# Patient Record
Sex: Male | Born: 1950
Health system: Southern US, Community
[De-identification: ages and names within clinical notes are randomized; demographics above are authoritative.]

## PROBLEM LIST (undated history)

## (undated) DIAGNOSIS — G629 Polyneuropathy, unspecified: Secondary | ICD-10-CM

## (undated) DIAGNOSIS — M84373A Stress fracture, unspecified ankle, initial encounter for fracture: Secondary | ICD-10-CM

## (undated) DIAGNOSIS — K859 Acute pancreatitis without necrosis or infection, unspecified: Secondary | ICD-10-CM

## (undated) DIAGNOSIS — I739 Peripheral vascular disease, unspecified: Secondary | ICD-10-CM

## (undated) DIAGNOSIS — M1990 Unspecified osteoarthritis, unspecified site: Secondary | ICD-10-CM

## (undated) DIAGNOSIS — I10 Essential (primary) hypertension: Secondary | ICD-10-CM

## (undated) DIAGNOSIS — F1010 Alcohol abuse, uncomplicated: Secondary | ICD-10-CM

## (undated) DIAGNOSIS — K219 Gastro-esophageal reflux disease without esophagitis: Secondary | ICD-10-CM

## (undated) DIAGNOSIS — E119 Type 2 diabetes mellitus without complications: Secondary | ICD-10-CM

## (undated) HISTORY — DX: Acute pancreatitis without necrosis or infection, unspecified: K85.90

## (undated) HISTORY — DX: Stress fracture, unspecified ankle, initial encounter for fracture: M84.373A

## (undated) HISTORY — DX: Essential (primary) hypertension: I10

## (undated) HISTORY — PX: PERIPHERAL VASCULAR CATHETERIZATION: SHX172C

## (undated) HISTORY — DX: Alcohol abuse, uncomplicated: F10.10

## (undated) HISTORY — DX: Type 2 diabetes mellitus without complications: E11.9

---

## 2006-02-13 ENCOUNTER — Inpatient Hospital Stay: Payer: Self-pay | Admitting: Internal Medicine

## 2006-02-17 ENCOUNTER — Other Ambulatory Visit: Payer: Self-pay

## 2007-02-27 ENCOUNTER — Other Ambulatory Visit: Payer: Self-pay

## 2007-02-27 ENCOUNTER — Inpatient Hospital Stay: Payer: Self-pay | Admitting: Internal Medicine

## 2007-10-07 ENCOUNTER — Encounter: Admission: RE | Admit: 2007-10-07 | Discharge: 2007-10-07 | Payer: Self-pay | Admitting: Family Medicine

## 2008-09-19 IMAGING — CT CT ABDOMEN W/ CM
2 of 5 series · 17 of 46 positions shown, 19 images · IV contrast (READICAT/WATER & [ID] OMNI 300)
Comparison: None--- after completion of the study, the patient did
develop mild urticaria.

CLINICAL DATA: Abdominal pain, elevated lipase and lakes

CT ABDOMEN WITH CONTRAST
TECHNIQUE: Multidetector CT imaging of the abdomen was performed
following the standard protocol during bolus administration of
intravenous contrast.
Contrast: 100 ml Hmnipaque-BMM

[Series 3: routine abdomen · axial · 0.81mm/px · z∈[-296,-6]mm · 14 of 66 slices shown, 16 images]
[im 4/66  soft-tissue]
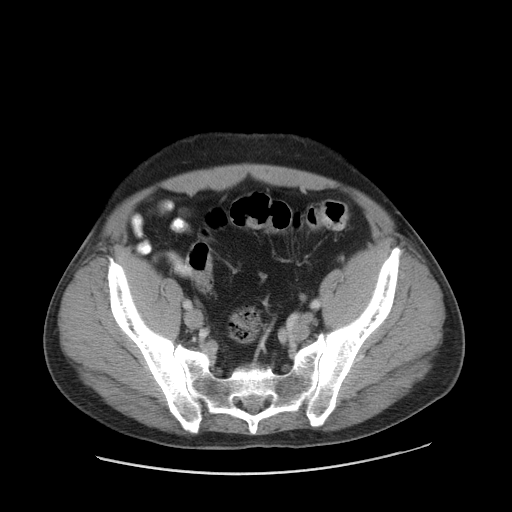
[im 4/66  bone]
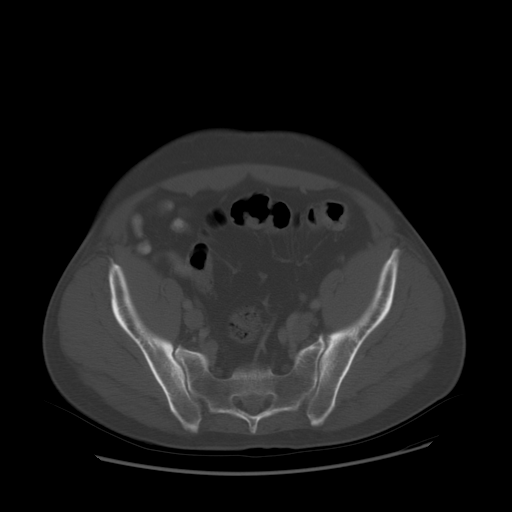
[im 8/66  soft-tissue]
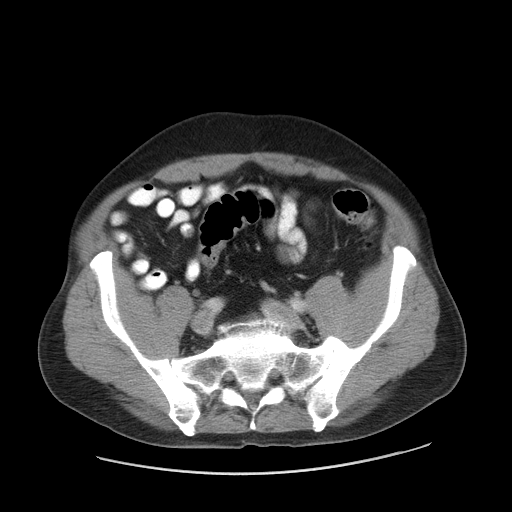
[im 15/66  soft-tissue]
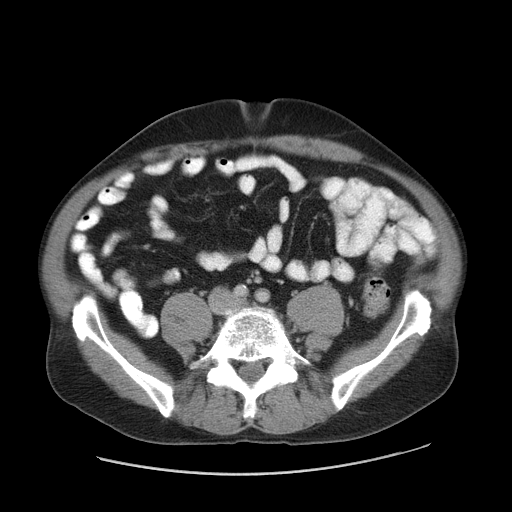
[im 19/66  soft-tissue]
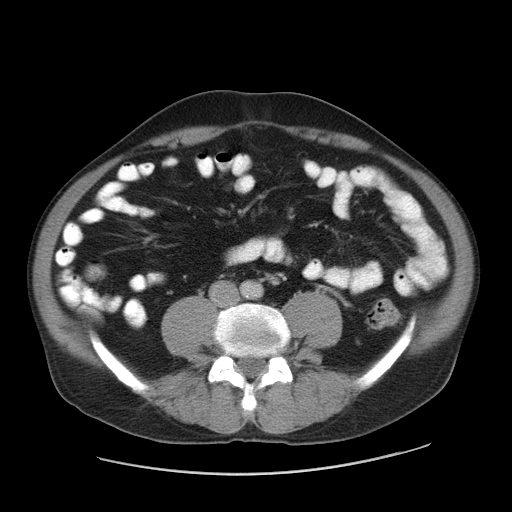
[im 22/66  soft-tissue]
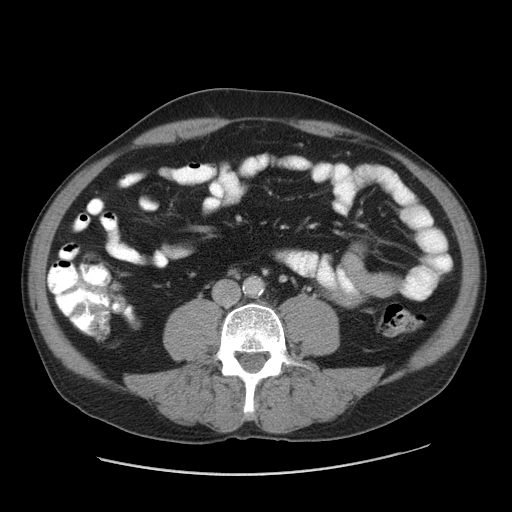
[im 26/66  soft-tissue]
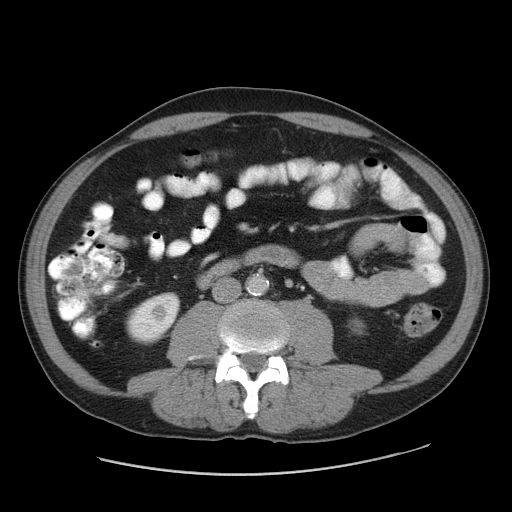
[im 29/66  soft-tissue]
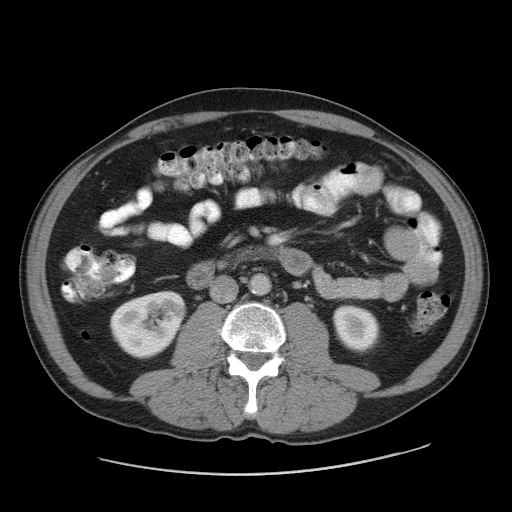
[im 37/66  soft-tissue]
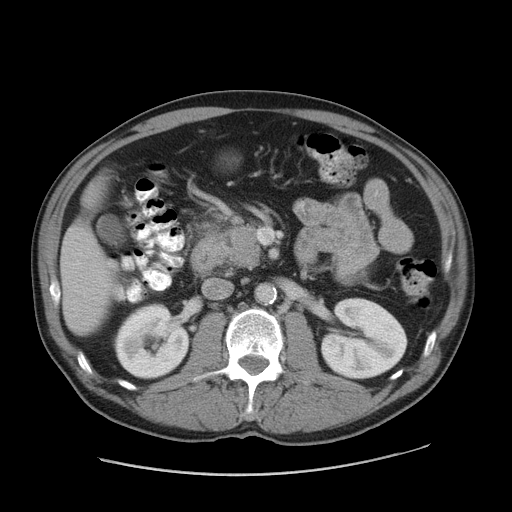
[im 40/66  soft-tissue]
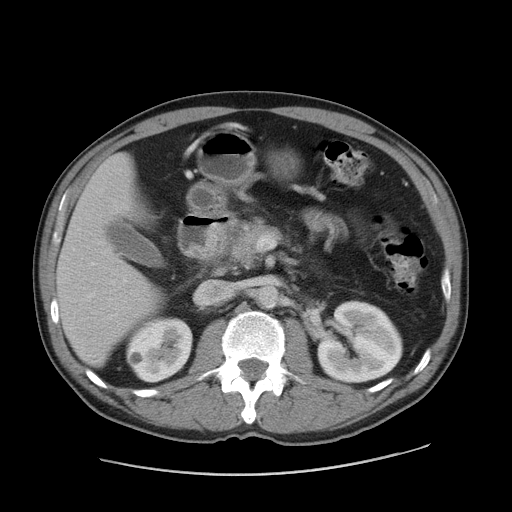
[im 40/66  bone]
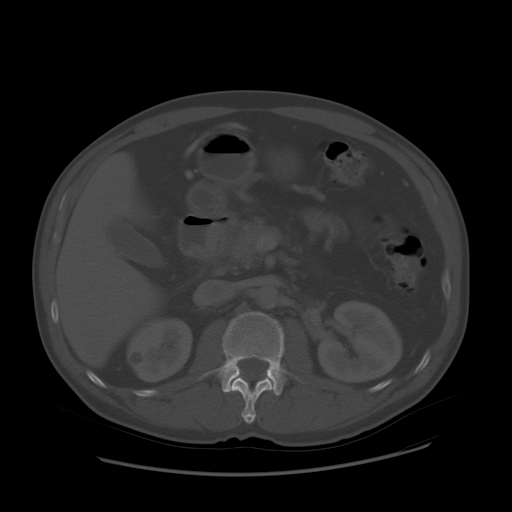
[im 44/66  soft-tissue]
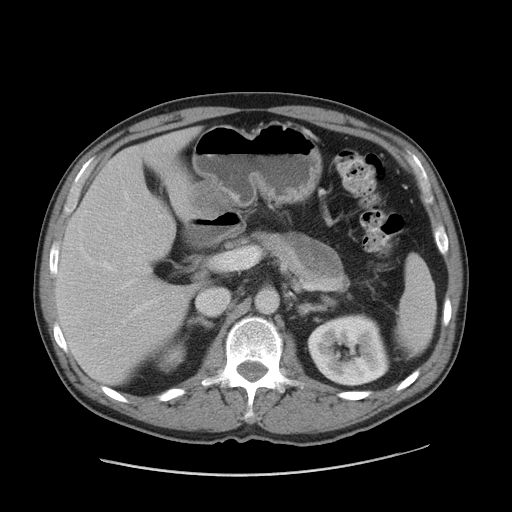
[im 47/66  soft-tissue]
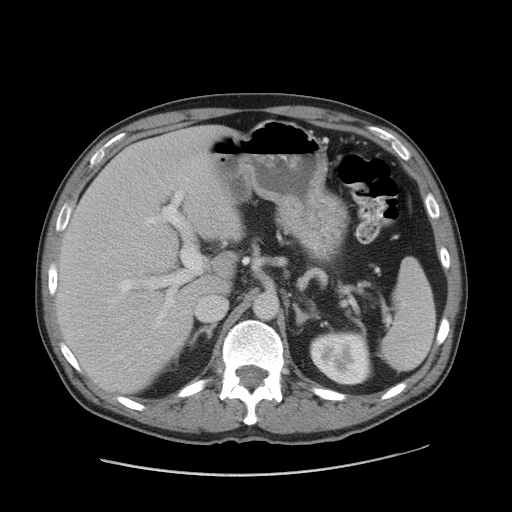
[im 51/66  soft-tissue]
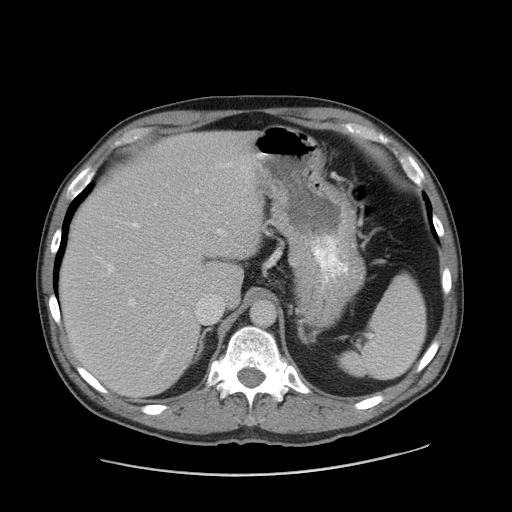
[im 58/66  soft-tissue]
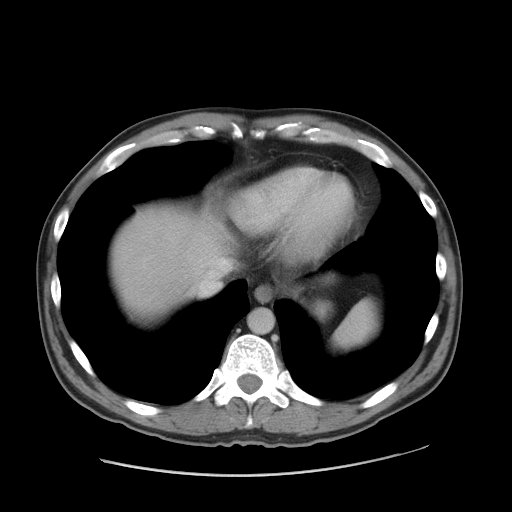
[im 62/66  soft-tissue]
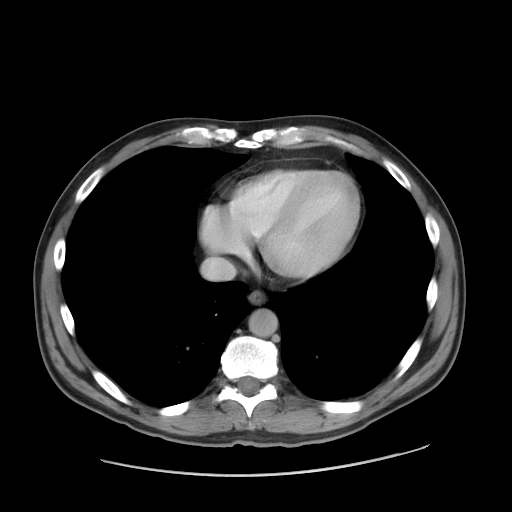

[Series 602: sagittal body · sagittal · 0.81mm/px · 3 of 154 slices shown]
[im 52/154  soft-tissue]
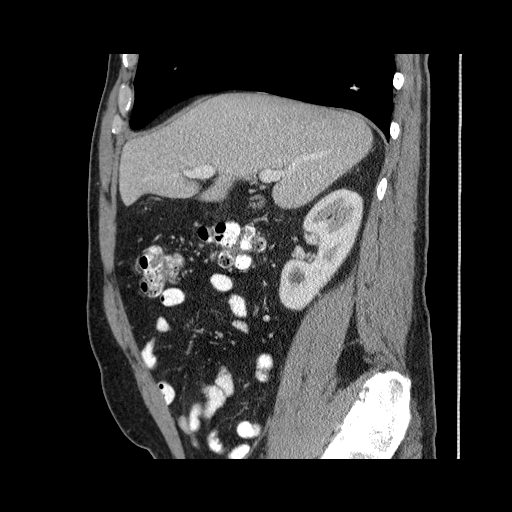
[im 69/154  soft-tissue]
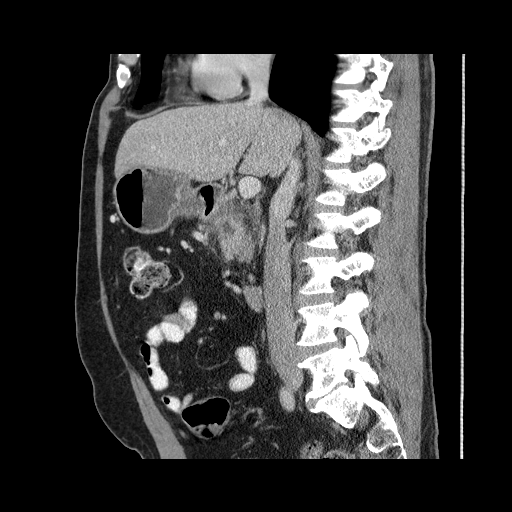
[im 86/154  soft-tissue]
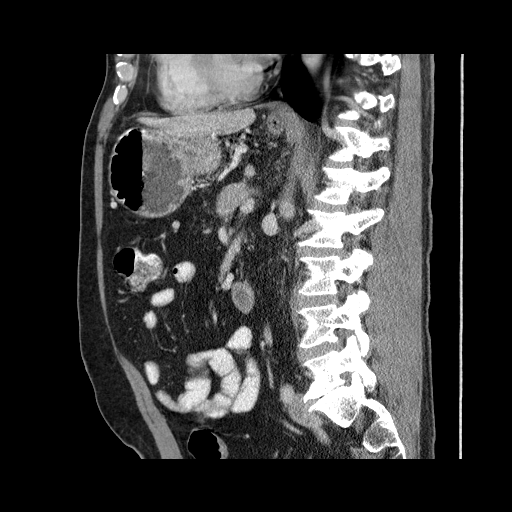

[17 of 46 positions shown; findings below may reference images not displayed]

He was given fluids and observed, and in
the future premedication with Benadryl may be helpful prior to I
being given IV contrast media.
FINDINGS: The lung bases are clear.  The liver enhances with no
focal abnormality and no ductal dilatation is seen.  No calcified
gallstones are noted.  There is poorly defined low attenuation in
the head - neck of the pancreas with peripancreatic strandiness.
These findings are consistent with focal pancreatitis.  In addition
there is an oval low attenuation located along the anterior body of
the mid pancreas to the tail of pancreas measuring 60 x 22 mm with
a height of 29 mm,.  This probably represents a developing
pseudocyst, .  Pancreatic duct is not dilated.  Follow-up CT or MRI
is recommended to exclude underlying malignancy.  The adrenal
glands and spleen appear normal.  The kidneys enhance and there is
a small cyst in the lower pole of the right kidney.  The
pelvocaliceal systems appear normal and the aorta is normal in
caliber.  The appendix is well seen within the right lower quadrant
and appears normal.  There are degenerative changes noted
particulate L5-S1.
IMPRESSION: 1.  Probable focal pancreatitis of the head of the pancreas with
developing pseudocyst along the body and tail of pancreas
anteriorly as described above.  Recommend follow-up CT of the
abdomen or MRI to exclude underlying malignancy.  Pancreatic duct
is not dilated.
2.  Appendix appears normal.
3.  Degenerative disc disease at L5-S1.

## 2010-07-16 ENCOUNTER — Encounter: Payer: Self-pay | Admitting: Family Medicine

## 2012-10-15 ENCOUNTER — Encounter: Payer: Self-pay | Admitting: Family Medicine

## 2012-10-15 ENCOUNTER — Other Ambulatory Visit: Payer: Self-pay | Admitting: Family Medicine

## 2012-10-15 ENCOUNTER — Ambulatory Visit (INDEPENDENT_AMBULATORY_CARE_PROVIDER_SITE_OTHER): Payer: BC Managed Care – PPO | Admitting: Family Medicine

## 2012-10-15 VITALS — BP 160/90 | HR 88 | Temp 97.9°F | Resp 16 | Wt 203.0 lb

## 2012-10-15 DIAGNOSIS — I10 Essential (primary) hypertension: Secondary | ICD-10-CM

## 2012-10-15 DIAGNOSIS — F17200 Nicotine dependence, unspecified, uncomplicated: Secondary | ICD-10-CM

## 2012-10-15 DIAGNOSIS — I739 Peripheral vascular disease, unspecified: Secondary | ICD-10-CM

## 2012-10-15 DIAGNOSIS — G609 Hereditary and idiopathic neuropathy, unspecified: Secondary | ICD-10-CM

## 2012-10-15 DIAGNOSIS — K859 Acute pancreatitis without necrosis or infection, unspecified: Secondary | ICD-10-CM | POA: Insufficient documentation

## 2012-10-15 DIAGNOSIS — F1010 Alcohol abuse, uncomplicated: Secondary | ICD-10-CM | POA: Insufficient documentation

## 2012-10-15 LAB — CBC WITH DIFFERENTIAL/PLATELET
Basophils Absolute: 0 10*3/uL (ref 0.0–0.1)
Basophils Relative: 0 % (ref 0–1)
Eosinophils Absolute: 0.1 10*3/uL (ref 0.0–0.7)
Eosinophils Relative: 1 % (ref 0–5)
HCT: 45.5 % (ref 39.0–52.0)
Hemoglobin: 15.7 g/dL (ref 13.0–17.0)
Lymphocytes Relative: 27 % (ref 12–46)
Lymphs Abs: 2.9 10*3/uL (ref 0.7–4.0)
MCH: 31.3 pg (ref 26.0–34.0)
MCHC: 34.5 g/dL (ref 30.0–36.0)
MCV: 90.6 fL (ref 78.0–100.0)
Monocytes Absolute: 0.6 10*3/uL (ref 0.1–1.0)
Monocytes Relative: 6 % (ref 3–12)
Neutro Abs: 7.2 10*3/uL (ref 1.7–7.7)
Neutrophils Relative %: 66 % (ref 43–77)
Platelets: 250 10*3/uL (ref 150–400)
RBC: 5.02 MIL/uL (ref 4.22–5.81)
RDW: 14.4 % (ref 11.5–15.5)
WBC: 10.9 10*3/uL — ABNORMAL HIGH (ref 4.0–10.5)

## 2012-10-15 LAB — BASIC METABOLIC PANEL
BUN: 13 mg/dL (ref 6–23)
CO2: 28 mEq/L (ref 19–32)
Calcium: 9.8 mg/dL (ref 8.4–10.5)
Chloride: 104 mEq/L (ref 96–112)
Creat: 0.77 mg/dL (ref 0.50–1.35)
Glucose, Bld: 139 mg/dL — ABNORMAL HIGH (ref 70–99)
Potassium: 4.8 mEq/L (ref 3.5–5.3)
Sodium: 138 mEq/L (ref 135–145)

## 2012-10-15 MED ORDER — AMLODIPINE BESYLATE 10 MG PO TABS: 10.0000 mg | ORAL_TABLET | Freq: Every day | ORAL | Status: DC

## 2012-10-15 NOTE — Progress Notes (Signed)
Subjective:    Patient ID: Oscar Mills, male    DOB: 1951/03/10, 62 y.o.   MRN: [id number]  HPI  The patient has not been seen for years. He continues to smoke every day. He has a history of pancreatitis in the past due to alcohol use. He is now absent from alcohol.  Unfortunately he is not up-to-date with any of his health maintenance screening. He comes in today complaining of several months of pain in both calves. This is made worse by wrapping, walking up an incline, or climbing a ladder. He also complains of burning dysesthesias and paresthesias in both feet. He denies any low back pain. He denies any numbness or weakness in the legs.  He denies any chest pain, shortness of breath, or dyspnea on exertion. Past Medical History  Diagnosis Date  . Pancreatitis   . Dysfunctional alcohol use     now quit   Current outpatient prescriptions:acetaminophen (TYLENOL) 325 MG tablet, Take 650 mg by mouth every 6 (six) hours as needed for pain., Disp: , Rfl: ;  Omeprazole-Sodium Bicarbonate (ZEGERID OTC PO), Take by mouth., Disp: , Rfl: ;  amLODipine (NORVASC) 10 MG tablet, Take 1 tablet (10 mg total) by mouth daily., Disp: 90 tablet, Rfl: 3  Allergies  Allergen Reactions  . Iohexol      Code: HIVES, Desc: HIVES WITH OMNIPAQUE 300 OBSERVED BY DR Gery Pray NO MEDS GIVEN    History   Social History  . Marital Status: Married    Spouse Name: N/A    Number of Children: N/A  . Years of Education: N/A   Occupational History  . Not on file.   Social History Main Topics  . Smoking status: Current Every Day Smoker -- 0.25 packs/day    Types: Cigarettes  . Smokeless tobacco: Not on file  . Alcohol Use: Not on file  . Drug Use: Not on file  . Sexually Active: Not on file   Other Topics Concern  . Not on file   Social History Narrative  . No narrative on file      Review of Systems  All other systems reviewed and are negative.       Objective:   Physical Exam  Constitutional:  He is oriented to person, place, and time. He appears well-developed and well-nourished.  HENT:  Head: Normocephalic.  Mouth/Throat: Oropharynx is clear and moist.  Eyes: Conjunctivae are normal. Pupils are equal, round, and reactive to light.  Neck: Neck supple. No JVD present. No thyromegaly present.  Cardiovascular: Normal rate, regular rhythm and normal heart sounds.  Exam reveals no gallop and no friction rub.   No murmur heard. Pulses:      Popliteal pulses are 0 on the right side, and 0 on the left side.       Dorsalis pedis pulses are 0 on the right side, and 1+ on the left side.       Posterior tibial pulses are 1+ on the right side, and 1+ on the left side.  Pulmonary/Chest: Effort normal and breath sounds normal. No respiratory distress. He has no wheezes. He has no rales. He exhibits no tenderness.  Abdominal: Soft. Bowel sounds are normal. He exhibits no distension and no mass. There is no tenderness. There is no rebound and no guarding.  Lymphadenopathy:    He has no cervical adenopathy.  Neurological: He is alert and oriented to person, place, and time. He has normal reflexes. He displays normal reflexes. No cranial nerve  deficit. He exhibits normal muscle tone. Coordination normal.   he has normal sensation to 10 mg monofilament bilaterally in both feet, normal sensation to cold, and normal sensation to vibration.        Assessment & Plan:  1. PVD (peripheral vascular disease) His symptoms are most likely claudication. We'll obtain vascular studies to evaluate. Strongly recommend smoking cessation. The patient has peripheral vascular disease he would benefit from being on an antiplatelet agent such as Plavix. - [place] Lower Ext Art Bilat; Future  2. Unspecified hereditary and idiopathic peripheral neuropathy He also has some symptoms of neuropathy. We'll evaluate for metabolic causes of neuropathy including hypothyroidism, B12 deficiency, and diabetes. He does have neuropathy  due to his history of alcohol abuse he - Basic Metabolic Panel - CBC with Differential - TSH - Vitamin B12  3. HTN (hypertension) Blood pressure is currently poorly controlled we will start Norvasc 10 mg by mouth daily. See him back in one month to reevaluate - amLODipine (NORVASC) 10 MG tablet; Take 1 tablet (10 mg total) by mouth daily.  Dispense: 90 tablet; Refill: 3  4. Smoker Advised smoking cessation.

## 2012-10-16 LAB — HEMOGLOBIN A1C
Hgb A1c MFr Bld: 5.7 % — ABNORMAL HIGH (ref <5.7)
Mean Plasma Glucose: 117 mg/dL — ABNORMAL HIGH (ref <117)

## 2012-10-16 LAB — VITAMIN B12: Vitamin B-12: 528 pg/mL (ref 211–911)

## 2012-10-16 LAB — TSH: TSH: 1.143 u[IU]/mL (ref 0.350–4.500)

## 2012-10-22 ENCOUNTER — Other Ambulatory Visit: Payer: Self-pay | Admitting: Family Medicine

## 2012-10-22 DIAGNOSIS — I739 Peripheral vascular disease, unspecified: Secondary | ICD-10-CM

## 2012-10-28 ENCOUNTER — Other Ambulatory Visit: Payer: Self-pay | Admitting: Family Medicine

## 2012-10-28 DIAGNOSIS — I739 Peripheral vascular disease, unspecified: Secondary | ICD-10-CM

## 2012-10-29 ENCOUNTER — Inpatient Hospital Stay: Admission: RE | Admit: 2012-10-29 | Payer: Self-pay | Source: Ambulatory Visit

## 2012-11-04 ENCOUNTER — Other Ambulatory Visit: Payer: Self-pay

## 2013-10-27 ENCOUNTER — Other Ambulatory Visit: Payer: Self-pay | Admitting: Family Medicine

## 2013-10-27 NOTE — Telephone Encounter (Signed)
Refill appropriate and filled per protocol. 

## 2013-11-20 ENCOUNTER — Ambulatory Visit (INDEPENDENT_AMBULATORY_CARE_PROVIDER_SITE_OTHER): Payer: BC Managed Care – PPO | Admitting: Family Medicine

## 2013-11-20 ENCOUNTER — Encounter: Payer: Self-pay | Admitting: Family Medicine

## 2013-11-20 VITALS — BP 130/70 | HR 74 | Temp 97.6°F | Resp 18 | Ht 74.0 in | Wt 204.0 lb

## 2013-11-20 DIAGNOSIS — I739 Peripheral vascular disease, unspecified: Secondary | ICD-10-CM

## 2013-11-20 NOTE — Progress Notes (Signed)
Subjective:    Patient ID: Oscar Mills, male    DOB: 21-May-1951, 63 y.o.   MRN: [id number]  HPI  10/15/12 The patient has not been seen for years. He continues to smoke every day. He has a history of pancreatitis in the past due to alcohol use. He is now absent from alcohol.  Unfortunately he is not up-to-date with any of his health maintenance screening. He comes in today complaining of several months of pain in both calves. This is made worse by wrapping, walking up an incline, or climbing a ladder. He also complains of burning dysesthesias and paresthesias in both feet. He denies any low back pain. He denies any numbness or weakness in the legs.  He denies any chest pain, shortness of breath, or dyspnea on exertion.  At that time, my plan was: 1. PVD (peripheral vascular disease) His symptoms are most likely claudication. We'll obtain vascular studies to evaluate. Strongly recommend smoking cessation. The patient has peripheral vascular disease he would benefit from being on an antiplatelet agent such as Plavix. - [place] Lower Ext Art Bilat; Future  2. Unspecified hereditary and idiopathic peripheral neuropathy He also has some symptoms of neuropathy. We'll evaluate for metabolic causes of neuropathy including hypothyroidism, B12 deficiency, and diabetes. He does have neuropathy due to his history of alcohol abuse he - Basic Metabolic Panel - CBC with Differential - TSH - Vitamin B12  3. HTN (hypertension) Blood pressure is currently poorly controlled we will start Norvasc 10 mg by mouth daily. See him back in one month to reevaluate - amLODipine (NORVASC) 10 MG tablet; Take 1 tablet (10 mg total) by mouth daily.  Dispense: 90 tablet; Refill: 3  4. Smoker Advised smoking cessation.  11/20/13 Patient did not go to get the arterial Dopplers. He presents today complaining of bilateral leg pain. He complains of tightness in both calves when he is walking or climbing a ladder or going up an  incline plane.  It improves with rest. He also complains of burning pain in both legs. The right is much worse than the left. He denies any low back pain. He is seen a chiropractor who thought his symptoms were right-sided sciatica. However this would not explain the bilateral nature of the pain. On examination today I cannot find any dorsalis pedis pulse posterior tibialis pulse and popliteal pulse on the right leg. He has strong femoral pulses in both legs Past Medical History  Diagnosis Date  . Pancreatitis   . Dysfunctional alcohol use     now quit   Current outpatient prescriptions:amLODipine (NORVASC) 10 MG tablet, Take 10 mg by mouth daily., Disp: , Rfl: ;  Omeprazole-Sodium Bicarbonate (ZEGERID OTC) 20-1100 MG CAPS capsule, Take 1 capsule by mouth daily before breakfast., Disp: , Rfl:   Allergies  Allergen Reactions  . Iohexol      Code: HIVES, Desc: HIVES WITH OMNIPAQUE 300 OBSERVED BY DR Alvester Chou NO MEDS GIVEN    History   Social History  . Marital Status: Married    Spouse Name: N/A    Number of Children: N/A  . Years of Education: N/A   Occupational History  . Not on file.   Social History Main Topics  . Smoking status: Current Every Day Smoker -- 0.25 packs/day    Types: Cigarettes  . Smokeless tobacco: Not on file  . Alcohol Use: Not on file  . Drug Use: Not on file  . Sexual Activity: Not on file   Other Topics  Concern  . Not on file   Social History Narrative  . No narrative on file      Review of Systems  All other systems reviewed and are negative.      Objective:   Physical Exam  Constitutional: He is oriented to person, place, and time. He appears well-developed and well-nourished.  HENT:  Head: Normocephalic.  Mouth/Throat: Oropharynx is clear and moist.  Eyes: Conjunctivae are normal. Pupils are equal, round, and reactive to light.  Neck: Neck supple. No JVD present. No thyromegaly present.  Cardiovascular: Normal rate, regular rhythm and  normal heart sounds.  Exam reveals no gallop and no friction rub.   No murmur heard. Pulses:      Popliteal pulses are 0 on the right side, and 0 on the left side.       Dorsalis pedis pulses are 0 on the right side, and 1+ on the left side.       Posterior tibial pulses are 1+ on the right side, and 1+ on the left side.  Pulmonary/Chest: Effort normal and breath sounds normal. No respiratory distress. He has no wheezes. He has no rales. He exhibits no tenderness.  Abdominal: Soft. Bowel sounds are normal. He exhibits no distension and no mass. There is no tenderness. There is no rebound and no guarding.  Lymphadenopathy:    He has no cervical adenopathy.  Neurological: He is alert and oriented to person, place, and time. He has normal reflexes. No cranial nerve deficit. He exhibits normal muscle tone. Coordination normal.   he has normal sensation to 10 mg monofilament bilaterally in both feet, normal sensation to cold, and normal sensation to vibration.        Assessment & Plan:   1. Claudication of calf muscles Differential diagnosis includes peripheral vascular disease versus neurogenic claudication. Given the absence of low back pain and bilateral nature of the pain, I believe the symptoms are most likely due to peripheral vascular disease. I will obtain lower extremity arterial Dopplers. I recommended aspirin 81 mg by mouth daily. Recommended smoking cessation. I also asked the patient return fasting for a CMP and fasting lipid panel. If the patient's arterial Doppler studies are normal, I would next proceed with an MRI of the lumbar spine to evaluate for spinal stenosis.

## 2013-11-30 ENCOUNTER — Encounter: Payer: Self-pay | Admitting: Family Medicine

## 2013-11-30 ENCOUNTER — Ambulatory Visit (INDEPENDENT_AMBULATORY_CARE_PROVIDER_SITE_OTHER): Payer: BC Managed Care – PPO | Admitting: Family Medicine

## 2013-11-30 ENCOUNTER — Other Ambulatory Visit: Payer: BC Managed Care – PPO

## 2013-11-30 ENCOUNTER — Other Ambulatory Visit (HOSPITAL_COMMUNITY): Payer: Self-pay | Admitting: Cardiology

## 2013-11-30 ENCOUNTER — Ambulatory Visit (HOSPITAL_COMMUNITY): Payer: BC Managed Care – PPO | Attending: Cardiology | Admitting: Cardiology

## 2013-11-30 VITALS — BP 140/78 | HR 76 | Temp 97.7°F | Resp 20 | Ht 74.0 in | Wt 202.0 lb

## 2013-11-30 DIAGNOSIS — I739 Peripheral vascular disease, unspecified: Secondary | ICD-10-CM

## 2013-11-30 DIAGNOSIS — I70219 Atherosclerosis of native arteries of extremities with intermittent claudication, unspecified extremity: Secondary | ICD-10-CM | POA: Insufficient documentation

## 2013-11-30 DIAGNOSIS — J209 Acute bronchitis, unspecified: Secondary | ICD-10-CM

## 2013-11-30 MED ORDER — AZITHROMYCIN 250 MG PO TABS: ORAL_TABLET | ORAL | Status: DC

## 2013-11-30 NOTE — Progress Notes (Signed)
Lower arterial doppler and duplex bilateral performed

## 2013-11-30 NOTE — Progress Notes (Signed)
   Subjective:    Patient ID: Oscar Mills, male    DOB: 08-21-1950, 63 y.o.   MRN: [id number]  HPI The patient had an upper respiratory infection for over a week. Is characterized by rhinorrhea, sinus pressure, sinus drainage. However over the last week he's developed a cough productive of green sputum increasing shortness of breath and chest congestion. He also reports subjective fevers. He is a smoker. He denies any chest pain. He denies any pleurisy. He denies any hemoptysis. Past Medical History  Diagnosis Date  . Pancreatitis   . Dysfunctional alcohol use     now quit   Current Outpatient Prescriptions on File Prior to Visit  Medication Sig Dispense Refill  . amLODipine (NORVASC) 10 MG tablet Take 10 mg by mouth daily.      Earney Navy Bicarbonate (ZEGERID OTC) 20-1100 MG CAPS capsule Take 1 capsule by mouth daily before breakfast.       No current facility-administered medications on file prior to visit.   Allergies  Allergen Reactions  . Iohexol      Code: HIVES, Desc: HIVES WITH OMNIPAQUE 300 OBSERVED BY DR Alvester Chou NO MEDS GIVEN    History   Social History  . Marital Status: Married    Spouse Name: N/A    Number of Children: N/A  . Years of Education: N/A   Occupational History  . Not on file.   Social History Main Topics  . Smoking status: Current Every Day Smoker -- 0.25 packs/day    Types: Cigarettes  . Smokeless tobacco: Not on file  . Alcohol Use: Not on file  . Drug Use: Not on file  . Sexual Activity: Not on file   Other Topics Concern  . Not on file   Social History Narrative  . No narrative on file      Review of Systems  All other systems reviewed and are negative.      Objective:   Physical Exam  Vitals reviewed. Constitutional: He appears well-developed and well-nourished. No distress.  HENT:  Right Ear: External ear normal.  Left Ear: External ear normal.  Nose: Nose normal.  Mouth/Throat: Oropharynx is clear and  moist. No oropharyngeal exudate.  Eyes: Conjunctivae are normal.  Neck: Neck supple.  Cardiovascular: Normal rate, regular rhythm and normal heart sounds.   Pulmonary/Chest: Effort normal and breath sounds normal. No respiratory distress. He has no wheezes. He has no rales.  Lymphadenopathy:    He has no cervical adenopathy.  Skin: He is not diaphoretic.          Assessment & Plan:  1. Acute bronchitis I recommended the patient begin Mucinex 400 mg every 4 hours when necessary coughing. Also recommended he begin Zithromax 500 mg by mouth daily one, 250 mg by mouth daily on days 2-5. Recheck in 1 week if no better sooner if worse. - azithromycin (ZITHROMAX) 250 MG tablet; 2 tabs poqday1, 1 tab poqday 2-5  Dispense: 6 tablet; Refill: 0

## 2013-12-02 ENCOUNTER — Other Ambulatory Visit: Payer: Self-pay | Admitting: Family Medicine

## 2013-12-02 DIAGNOSIS — I70219 Atherosclerosis of native arteries of extremities with intermittent claudication, unspecified extremity: Secondary | ICD-10-CM

## 2014-01-21 ENCOUNTER — Encounter: Payer: Self-pay | Admitting: Internal Medicine

## 2014-01-21 ENCOUNTER — Ambulatory Visit (INDEPENDENT_AMBULATORY_CARE_PROVIDER_SITE_OTHER): Payer: BC Managed Care – PPO | Admitting: Internal Medicine

## 2014-01-21 ENCOUNTER — Telehealth: Payer: Self-pay | Admitting: *Deleted

## 2014-01-21 VITALS — BP 144/76 | HR 66 | Ht 74.0 in | Wt 202.7 lb

## 2014-01-21 DIAGNOSIS — F17200 Nicotine dependence, unspecified, uncomplicated: Secondary | ICD-10-CM

## 2014-01-21 DIAGNOSIS — I739 Peripheral vascular disease, unspecified: Secondary | ICD-10-CM | POA: Insufficient documentation

## 2014-01-21 NOTE — Telephone Encounter (Signed)
Patient is scheduled 01/25/14 at 11:45

## 2014-01-21 NOTE — Progress Notes (Signed)
OFFICE NOTE  Chief Complaint:  Right leg pain  Primary Care Physician: Odette Fraction, MD  HPI:  Oscar Mills is a pleasant 63 year old male kindly referred to me by Dr. Dennard Schaumann. He is a smoker with hypertension and a family history of heart disease. Over the past 2 years he's had pain in his right leg which has become progressively worse. When he walks he gets pain in his calf that does improve after resting. He starts walking again his pain comes back. Quickly. He denies any pain at rest. He does note that his feet are slightly cool particularly on the right. He denies any chest pain or shortness of breath with exertion. He recently had Dopplers of the right leg which indicated an ABI of 0.4 for the right and 1.2 on the left. The right TBI was 0.35 and 1.0 the left. Is referred for further workup of peripheral arterial disease.  PMHx:  Past Medical History  Diagnosis Date  . Pancreatitis   . Dysfunctional alcohol use     now quit  . Hypertension     History reviewed. No pertinent past surgical history.  FAMHx:  Family History  Problem Relation Age of Onset  . Heart attack Father     SOCHx:   reports that he has been smoking Cigarettes.  He has a 7.5 pack-year smoking history. He has never used smokeless tobacco. He reports that he does not drink alcohol or use illicit drugs.  ALLERGIES:  Allergies  Allergen Reactions  . Iohexol      Code: HIVES, Desc: HIVES WITH OMNIPAQUE 300 OBSERVED BY DR Alvester Chou NO MEDS GIVEN     ROS: A comprehensive review of systems was negative except for: Musculoskeletal: positive for right leg claudication  HOME MEDS: Current Outpatient Prescriptions  Medication Sig Dispense Refill  . amLODipine (NORVASC) 10 MG tablet Take 10 mg by mouth daily.      Marland Kitchen aspirin 81 MG tablet Take 81 mg by mouth daily.      Earney Navy Bicarbonate (ZEGERID OTC) 20-1100 MG CAPS capsule Take 1 capsule by mouth daily before breakfast.       No  current facility-administered medications for this visit.    LABS/IMAGING: No results found for this or any previous visit (from the past 48 hour(s)). No results found.  VITALS: BP 144/76  Pulse 66  Ht 6\' 2"  (1.88 m)  Wt 202 lb 11.2 oz (91.944 kg)  BMI 26.01 kg/m2  EXAM: General appearance: alert and no distress Neck: no carotid bruit and no JVD Lungs: clear to auscultation bilaterally Heart: regular rate and rhythm, S1, S2 normal, no murmur, click, rub or gallop Abdomen: soft, non-tender; bowel sounds normal; no masses,  no organomegaly Extremities: extremities normal, atraumatic, no cyanosis or edema Pulses: right DP/PT are diminished, right popliteal is 1+, 2+ pulses on the left Skin: Skin color, texture, turgor normal. No rashes or lesions Neurologic: Grossly normal Psych: Normal  EKG: Normal sinus rhythm at 66, incomplete right bundle branch pattern  ASSESSMENT: 1. Symptomatic right lower extremity claudication with ABI 0.44 and TBI 0.35 2. Ongoing tobacco abuse 3. Hypertension 4. Family history heart disease  PLAN: 1.   Mr. Self has symptomatic right lower extremity claudication with severely reduced ABI. He is on aspirin and I doubt we'll have much benefit from additional symptomatic agents. I would recommend angiography as a definitive means to identify his stenosis which appears to be at the SFA level. He is interested in having  the procedure done in [place] and I discussed the case with Dr. Fletcher Anon, who is agreeable to perform the angiogram in the next couple of weeks. Who had tried arrange this through scheduling nurse as well as get preprocedure labs and a fasting lipid profile. I suspect he will need cholesterol medication as well. He may need a cardiovascular workup based on his risk factors as well although he's not describing any chest pain or shortness of breath.  Thanks for the kind referral.  Pixie Casino, MD, [facility] Attending Cardiologist [facility]  Sherita Decoste C 01/21/2014, 9:29 AM

## 2014-01-21 NOTE — Telephone Encounter (Signed)
Message copied by Tracie Harrier on Thu Jan 21, 2014  5:22 PM ------      Message from: [name] A      Created: Thu Jan 21, 2014  5:09 PM       Will do.             Elmyra Ricks, can you schedule this patient to see me ASAP.             ----- Message -----         From: Pixie Casino, MD         Sent: 01/21/2014   4:52 PM           To: Wellington Hampshire, MD            Maybe my misunderstanding .. I thought you did PV cases in [place].  Maybe you can see him in the office first in [place] and then offer to do the case in [place].  He has had this for 2 years, but now that he knows what it is, he wants to get better quickly .Marland Kitchen So he may be agreeable to getting it done wherever he has to.            Thanks.            -Mali            ----- Message -----         From: Wellington Hampshire, MD         Sent: 01/21/2014   4:41 PM           To: Pixie Casino, MD            Mali,      Sorry I misunderstood. I thought he wanted his future clinic follow up to be in [place] not the procedure itself.       I have done PV procedures at [facility] in the past but not recently due to due to lack of scheduling flexibility and different equipment.       If you want, I can have my nurse get in touch with him and arrange the procedure at [facility]. If he absolutely wants to be done at [facility], I can do that as well.       Thanks.             ----- Message -----         From: Pixie Casino, MD         Sent: 01/21/2014   9:34 AM           To: Wellington Hampshire, MD                               ------

## 2014-01-21 NOTE — Patient Instructions (Addendum)
Dr. Fletcher Anon will be doing a peripheral angiogram - on either August 5th or 12th.  You will have to do some pre-procedure labs for this - but Dr. Tyrell Antonio nurse will contact you about this and about scheduling this procedure at [facility]  ** CBC, BMET, TSH, PT, PTT, lipid

## 2014-01-25 ENCOUNTER — Ambulatory Visit: Payer: BC Managed Care – PPO | Admitting: Cardiovascular Disease

## 2014-01-27 ENCOUNTER — Other Ambulatory Visit: Payer: Self-pay | Admitting: Family Medicine

## 2014-01-28 NOTE — Telephone Encounter (Signed)
Refill appropriate and filled per protocol. 

## 2014-02-02 ENCOUNTER — Ambulatory Visit: Payer: BC Managed Care – PPO | Admitting: Cardiovascular Disease

## 2014-02-04 ENCOUNTER — Encounter: Payer: Self-pay | Admitting: *Deleted

## 2014-02-04 ENCOUNTER — Encounter: Payer: Self-pay | Admitting: Cardiovascular Disease

## 2014-02-04 ENCOUNTER — Encounter (INDEPENDENT_AMBULATORY_CARE_PROVIDER_SITE_OTHER): Payer: Self-pay

## 2014-02-04 ENCOUNTER — Ambulatory Visit (INDEPENDENT_AMBULATORY_CARE_PROVIDER_SITE_OTHER): Payer: BC Managed Care – PPO | Admitting: Cardiovascular Disease

## 2014-02-04 VITALS — BP 118/70 | HR 64 | Ht 74.0 in | Wt 203.5 lb

## 2014-02-04 DIAGNOSIS — Z01812 Encounter for preprocedural laboratory examination: Secondary | ICD-10-CM

## 2014-02-04 DIAGNOSIS — I739 Peripheral vascular disease, unspecified: Secondary | ICD-10-CM

## 2014-02-04 MED ORDER — CLOPIDOGREL BISULFATE 75 MG PO TABS: 75.0000 mg | ORAL_TABLET | Freq: Every day | ORAL | Status: DC

## 2014-02-04 MED ORDER — PANTOPRAZOLE SODIUM 40 MG PO TBEC: 40.0000 mg | DELAYED_RELEASE_TABLET | Freq: Every day | ORAL | Status: DC

## 2014-02-04 NOTE — Progress Notes (Signed)
OFFICE NOTE  Chief Complaint:  Right leg pain  Primary Care Physician: Oscar Fraction, MD  HPI:  Oscar Mills is a pleasant 63 year old male kindly referred to me by Dr. Dennard Mills and Oscar Mills for evaluation and management of recently diagnosed peripheral arterial disease. He is a smoker with hypertension and a family history of heart disease. Over the past 2 years he's had pain in his right leg which has become progressively worse. When he walks he gets pain in his calf that does improve after resting. He starts walking again his pain comes back. This usually happens after walking about 100 feet Quickly. He denies any pain at rest. He does note that his feet are slightly cool particularly on the right. He denies any chest pain or shortness of breath with exertion. He recently had Dopplers of the right leg which indicated an ABI of 0.44 on the right and 1.2 on the left. The right TBI was 0.35 and 1.0 the left.    PMHx:  Past Medical History  Diagnosis Date  . Pancreatitis   . Dysfunctional alcohol use     now quit  . Hypertension     History reviewed. No pertinent past surgical history.  FAMHx:  Family History  Problem Relation Age of Onset  . Heart attack Father     SOCHx:   reports that he has been smoking Cigarettes.  He has a 7.5 pack-year smoking history. He has never used smokeless tobacco. He reports that he does not drink alcohol or use illicit drugs.  ALLERGIES:  Allergies  Allergen Reactions  . Iohexol      Code: HIVES, Desc: HIVES WITH OMNIPAQUE 300 OBSERVED BY DR Oscar Mills NO MEDS GIVEN     ROS: A comprehensive review of systems was negative except for: Musculoskeletal: positive for right leg claudication  HOME MEDS: Current Outpatient Prescriptions  Medication Sig Dispense Refill  . amLODipine (NORVASC) 10 MG tablet TAKE 1 TABLET BY MOUTH EVERY DAY  90 tablet  2  . aspirin 81 MG tablet Take 81 mg by mouth daily.      Oscar Mills  Bicarbonate (ZEGERID OTC) 20-1100 MG CAPS capsule Take 1 capsule by mouth daily before breakfast.       No current facility-administered medications for this visit.    LABS/IMAGING: No results found for this or any previous visit (from the past 48 hour(s)). No results found.  VITALS: Ht 6\' 2"  (1.88 m)  Wt 203 lb 8 oz (92.307 kg)  BMI 26.12 kg/m2  EXAM: General appearance: alert and no distress Neck: no carotid bruit and no JVD Lungs: clear to auscultation bilaterally Heart: regular rate and rhythm, S1, S2 normal, no murmur, click, rub or gallop Abdomen: soft, non-tender; bowel sounds normal; no masses,  no organomegaly Extremities: extremities normal, atraumatic, no cyanosis or edema Pulses: right DP/PT are absent and palpable on the left. Femoral pulses are normal bilaterally. Radial pulses are normal bilaterally.  Skin: Skin color, texture, turgor normal. No rashes or lesions Neurologic: Grossly normal Psych: Normal    ASSESSMENT: 1. Symptomatic severe right lower extremity claudication with ABI 0.44 and TBI 0.35 (Rutherford class 3).  2. Ongoing tobacco abuse 3. Hypertension 4. Family history heart disease  PLAN: 1.   Oscar Mills has severe right lower extremity claudication with severely reduced ABI. Arterial duplex showed an occluded mid right SFA with reconstitution distally and proximal popliteal artery stenosis. There was borderline significant disease in the left SFA. I discussed different management  options with the patient including attempting medical therapy versus proceeding with angiography and possible endovascular intervention. His symptoms are clearly severe and interfering with activities of daily living. He prefers a second option. I scheduled him for abdominal aortogram, lower extremity runoff and possible endovascular intervention. He wants to wait a few weeks as he is starting a new job. I started him on Plavix 75 mg once daily.  I scheduled fasting lipid  and liver profile with precath labs. I discussed with him the importance of smoking cessation.  Oscar Mills 02/04/2014, 1:49 PM

## 2014-02-04 NOTE — Patient Instructions (Addendum)
Your physician has requested that you have a peripheral vascular angiogram on February 24, 2014 at 0630 am. This exam is performed at the hospital. During this exam IV contrast is used to look at arterial blood flow. Please review the information sheet given for details.  Your physician recommends that you return for lab work in:  The week of 8/24 for fasting labs  BMP CBC INR  Lipid and Liver profile     Your physician has recommended you make the following change in your medication:  Stop Zegerid  Start Protonix 40 mg once daily  Start Plavix 75 mg once daily

## 2014-02-10 ENCOUNTER — Ambulatory Visit (INDEPENDENT_AMBULATORY_CARE_PROVIDER_SITE_OTHER): Payer: BC Managed Care – PPO | Admitting: *Deleted

## 2014-02-10 DIAGNOSIS — Z01812 Encounter for preprocedural laboratory examination: Secondary | ICD-10-CM

## 2014-02-10 DIAGNOSIS — I739 Peripheral vascular disease, unspecified: Secondary | ICD-10-CM

## 2014-02-11 LAB — BASIC METABOLIC PANEL
BUN/Creatinine Ratio: 17 (ref 10–22)
BUN: 15 mg/dL (ref 8–27)
CO2: 18 mmol/L (ref 18–29)
Calcium: 10.1 mg/dL (ref 8.6–10.2)
Chloride: 103 mmol/L (ref 97–108)
Creatinine, Ser: 0.86 mg/dL (ref 0.76–1.27)
GFR calc Af Amer: 108 mL/min/{1.73_m2} (ref >59)
GFR calc non Af Amer: 94 mL/min/{1.73_m2} (ref >59)
Glucose: 94 mg/dL (ref 65–99)
Potassium: 4.7 mmol/L (ref 3.5–5.2)
Sodium: 143 mmol/L (ref 134–144)

## 2014-02-11 LAB — CBC WITH DIFFERENTIAL
Basophils Absolute: 0 10*3/uL (ref 0.0–0.2)
Basos: 0 %
Eos: 1 %
Eosinophils Absolute: 0.2 10*3/uL (ref 0.0–0.4)
HCT: 45.9 % (ref 37.5–51.0)
Hemoglobin: 15.5 g/dL (ref 12.6–17.7)
Immature Grans (Abs): 0 10*3/uL (ref 0.0–0.1)
Immature Granulocytes: 0 %
Lymphocytes Absolute: 2.9 10*3/uL (ref 0.7–3.1)
Lymphs: 23 %
MCH: 31.2 pg (ref 26.6–33.0)
MCHC: 33.8 g/dL (ref 31.5–35.7)
MCV: 92 fL (ref 79–97)
Monocytes Absolute: 0.7 10*3/uL (ref 0.1–0.9)
Monocytes: 5 %
Neutrophils Absolute: 8.9 10*3/uL — ABNORMAL HIGH (ref 1.4–7.0)
Neutrophils Relative %: 71 %
Platelets: 279 10*3/uL (ref 150–379)
RBC: 4.97 x10E6/uL (ref 4.14–5.80)
RDW: 14.8 % (ref 12.3–15.4)
WBC: 12.6 10*3/uL — ABNORMAL HIGH (ref 3.4–10.8)

## 2014-02-11 LAB — LIPID PANEL
Chol/HDL Ratio: 4.8 ratio units (ref 0.0–5.0)
Cholesterol, Total: 149 mg/dL (ref 100–199)
HDL: 31 mg/dL — ABNORMAL LOW (ref >39)
LDL Calculated: 91 mg/dL (ref 0–99)
Triglycerides: 134 mg/dL (ref 0–149)
VLDL Cholesterol Cal: 27 mg/dL (ref 5–40)

## 2014-02-11 LAB — HEPATIC FUNCTION PANEL
ALT: 13 IU/L (ref 0–44)
AST: 15 IU/L (ref 0–40)
Albumin: 4.5 g/dL (ref 3.6–4.8)
Alkaline Phosphatase: 71 IU/L (ref 39–117)
Bilirubin, Direct: 0.08 mg/dL (ref 0.00–0.40)
Total Bilirubin: 0.3 mg/dL (ref 0.0–1.2)
Total Protein: 7.1 g/dL (ref 6.0–8.5)

## 2014-02-11 LAB — PROTIME-INR
INR: 1 (ref 0.8–1.2)
Prothrombin Time: 10.6 s (ref 9.1–12.0)

## 2014-02-12 ENCOUNTER — Ambulatory Visit: Payer: BC Managed Care – PPO | Admitting: Cardiovascular Disease

## 2014-02-15 ENCOUNTER — Other Ambulatory Visit: Payer: BC Managed Care – PPO

## 2014-02-18 ENCOUNTER — Encounter (HOSPITAL_COMMUNITY): Payer: Self-pay | Admitting: Pharmacy Technician

## 2014-02-18 ENCOUNTER — Telehealth: Payer: Self-pay | Admitting: *Deleted

## 2014-02-18 ENCOUNTER — Telehealth: Payer: Self-pay

## 2014-02-18 NOTE — Telephone Encounter (Signed)
Pt wife called, has some questions regarding his medications and his procedure on 02/24/2014

## 2014-02-18 NOTE — Telephone Encounter (Signed)
Error

## 2014-02-18 NOTE — Telephone Encounter (Signed)
Reviewed medication instructions for procedure  Patient verbalized understanding

## 2014-02-24 ENCOUNTER — Ambulatory Visit (HOSPITAL_COMMUNITY)
Admission: RE | Admit: 2014-02-24 | Discharge: 2014-02-24 | Disposition: A | Discharge status: Home / Self Care | Payer: BC Managed Care – PPO | Source: Ambulatory Visit | Attending: Cardiovascular Disease | Admitting: Cardiovascular Disease

## 2014-02-24 ENCOUNTER — Encounter (HOSPITAL_COMMUNITY)
Admission: RE | Disposition: A | Discharge status: Home / Self Care | Payer: Self-pay | Source: Ambulatory Visit | Attending: Cardiovascular Disease

## 2014-02-24 ENCOUNTER — Other Ambulatory Visit: Payer: Self-pay | Admitting: Cardiovascular Disease

## 2014-02-24 DIAGNOSIS — F17200 Nicotine dependence, unspecified, uncomplicated: Secondary | ICD-10-CM | POA: Insufficient documentation

## 2014-02-24 DIAGNOSIS — I70219 Atherosclerosis of native arteries of extremities with intermittent claudication, unspecified extremity: Secondary | ICD-10-CM | POA: Insufficient documentation

## 2014-02-24 DIAGNOSIS — I739 Peripheral vascular disease, unspecified: Secondary | ICD-10-CM | POA: Diagnosis present

## 2014-02-24 DIAGNOSIS — I10 Essential (primary) hypertension: Secondary | ICD-10-CM | POA: Diagnosis not present

## 2014-02-24 DIAGNOSIS — Z7982 Long term (current) use of aspirin: Secondary | ICD-10-CM | POA: Insufficient documentation

## 2014-02-24 HISTORY — PX: LOWER EXTREMITY ANGIOGRAM: SHX5508

## 2014-02-24 SURGERY — ANGIOGRAM, LOWER EXTREMITY
Anesthesia: LOCAL | Laterality: Bilateral

## 2014-02-24 MED ORDER — FENTANYL CITRATE 0.05 MG/ML IJ SOLN
INTRAMUSCULAR | Status: AC
  Filled 2014-02-24: qty 2

## 2014-02-24 MED ORDER — DIPHENHYDRAMINE HCL 50 MG/ML IJ SOLN
25.0000 mg | INTRAMUSCULAR | Status: AC
  Administered 2014-02-24: 25 mg via INTRAVENOUS

## 2014-02-24 MED ORDER — SODIUM CHLORIDE 0.9 % IV SOLN: 250.0000 mL | INTRAVENOUS | Status: DC | PRN

## 2014-02-24 MED ORDER — CILOSTAZOL 50 MG PO TABS: 50.0000 mg | ORAL_TABLET | Freq: Two times a day (BID) | ORAL | Status: DC

## 2014-02-24 MED ORDER — METHYLPREDNISOLONE SODIUM SUCC 125 MG IJ SOLR
INTRAMUSCULAR | Status: AC
  Administered 2014-02-24: 125 mg via INTRAVENOUS
  Filled 2014-02-24: qty 2

## 2014-02-24 MED ORDER — SODIUM CHLORIDE 0.9 % IJ SOLN: 3.0000 mL | INTRAMUSCULAR | Status: DC | PRN

## 2014-02-24 MED ORDER — DIPHENHYDRAMINE HCL 50 MG/ML IJ SOLN
INTRAMUSCULAR | Status: AC
  Administered 2014-02-24: 25 mg via INTRAVENOUS
  Filled 2014-02-24: qty 1

## 2014-02-24 MED ORDER — HEPARIN (PORCINE) IN NACL 2-0.9 UNIT/ML-% IJ SOLN
INTRAMUSCULAR | Status: AC
  Filled 2014-02-24: qty 1000

## 2014-02-24 MED ORDER — METHYLPREDNISOLONE SODIUM SUCC 125 MG IJ SOLR
125.0000 mg | INTRAMUSCULAR | Status: AC
  Administered 2014-02-24: 125 mg via INTRAVENOUS

## 2014-02-24 MED ORDER — ASPIRIN 81 MG PO CHEW: 81.0000 mg | CHEWABLE_TABLET | ORAL | Status: DC

## 2014-02-24 MED ORDER — SODIUM CHLORIDE 0.9 % IV SOLN
INTRAVENOUS | Status: DC
  Administered 2014-02-24: 07:00:00 via INTRAVENOUS

## 2014-02-24 MED ORDER — MIDAZOLAM HCL 2 MG/2ML IJ SOLN
INTRAMUSCULAR | Status: AC
  Filled 2014-02-24: qty 2

## 2014-02-24 MED ORDER — LIDOCAINE HCL (PF) 1 % IJ SOLN
INTRAMUSCULAR | Status: AC
  Filled 2014-02-24: qty 30

## 2014-02-24 MED ORDER — SODIUM CHLORIDE 0.9 % IJ SOLN: 3.0000 mL | Freq: Two times a day (BID) | INTRAMUSCULAR | Status: DC

## 2014-02-24 MED ORDER — SODIUM CHLORIDE 0.9 % IV SOLN: INTRAVENOUS | Status: AC

## 2014-02-24 NOTE — H&P (View-Only) (Signed)
OFFICE NOTE  Chief Complaint:  Right leg pain  Primary Care Physician: Oscar Fraction, MD  HPI:  Oscar Mills is a pleasant 63 year old male kindly referred to me by Dr. Dennard Mills and Oscar Mills for evaluation and management of recently diagnosed peripheral arterial disease. He is a smoker with hypertension and a family history of heart disease. Over the past 2 years he's had pain in his right leg which has become progressively worse. When he walks he gets pain in his calf that does improve after resting. He starts walking again his pain comes back. This usually happens after walking about 100 feet Quickly. He denies any pain at rest. He does note that his feet are slightly cool particularly on the right. He denies any chest pain or shortness of breath with exertion. He recently had Dopplers of the right leg which indicated an ABI of 0.44 on the right and 1.2 on the left. The right TBI was 0.35 and 1.0 the left.    PMHx:  Past Medical History  Diagnosis Date  . Pancreatitis   . Dysfunctional alcohol use     now quit  . Hypertension     History reviewed. No pertinent past surgical history.  FAMHx:  Family History  Problem Relation Age of Onset  . Heart attack Father     SOCHx:   reports that he has been smoking Cigarettes.  He has a 7.5 pack-year smoking history. He has never used smokeless tobacco. He reports that he does not drink alcohol or use illicit drugs.  ALLERGIES:  Allergies  Allergen Reactions  . Iohexol      Code: HIVES, Desc: HIVES WITH OMNIPAQUE 300 OBSERVED BY DR Alvester Chou NO MEDS GIVEN     ROS: A comprehensive review of systems was negative except for: Musculoskeletal: positive for right leg claudication  HOME MEDS: Current Outpatient Prescriptions  Medication Sig Dispense Refill  . amLODipine (NORVASC) 10 MG tablet TAKE 1 TABLET BY MOUTH EVERY DAY  90 tablet  2  . aspirin 81 MG tablet Take 81 mg by mouth daily.      Oscar Mills  Bicarbonate (ZEGERID OTC) 20-1100 MG CAPS capsule Take 1 capsule by mouth daily before breakfast.       No current facility-administered medications for this visit.    LABS/IMAGING: No results found for this or any previous visit (from the past 48 hour(s)). No results found.  VITALS: Ht 6\' 2"  (1.88 m)  Wt 203 lb 8 oz (92.307 kg)  BMI 26.12 kg/m2  EXAM: General appearance: alert and no distress Neck: no carotid bruit and no JVD Lungs: clear to auscultation bilaterally Heart: regular rate and rhythm, S1, S2 normal, no murmur, click, rub or gallop Abdomen: soft, non-tender; bowel sounds normal; no masses,  no organomegaly Extremities: extremities normal, atraumatic, no cyanosis or edema Pulses: right DP/PT are absent and palpable on the left. Femoral pulses are normal bilaterally. Radial pulses are normal bilaterally.  Skin: Skin color, texture, turgor normal. No rashes or lesions Neurologic: Grossly normal Psych: Normal    ASSESSMENT: 1. Symptomatic severe right lower extremity claudication with ABI 0.44 and TBI 0.35 (Rutherford class 3).  2. Ongoing tobacco abuse 3. Hypertension 4. Family history heart disease  PLAN: 1.   Oscar Mills has severe right lower extremity claudication with severely reduced ABI. Arterial duplex showed an occluded mid right SFA with reconstitution distally and proximal popliteal artery stenosis. There was borderline significant disease in the left SFA. I discussed different management  options with the patient including attempting medical therapy versus proceeding with angiography and possible endovascular intervention. His symptoms are clearly severe and interfering with activities of daily living. He prefers a second option. I scheduled him for abdominal aortogram, lower extremity runoff and possible endovascular intervention. He wants to wait a few weeks as he is starting a new job. I started him on Plavix 75 mg once daily.  I scheduled fasting lipid  and liver profile with precath labs. I discussed with him the importance of smoking cessation.  Oscar Mills 02/04/2014, 1:49 PM

## 2014-02-24 NOTE — Interval H&P Note (Signed)
History and Physical Interval Note:  02/24/2014 8:22 AM  Oscar Mills  has presented today for surgery, with the diagnosis of PAD  The various methods of treatment have been discussed with the patient and family. After consideration of risks, benefits and other options for treatment, the patient has consented to  Procedure(s): LOWER EXTREMITY ANGIOGRAM (N/A) as a surgical intervention .  The patient's history has been reviewed, patient examined, no change in status, stable for surgery.  I have reviewed the patient's chart and labs.  Questions were answered to the patient's satisfaction.     [name]

## 2014-02-24 NOTE — Progress Notes (Signed)
transferred pt to short stay per stretcher in no acute distress. Alert oriented .hob flat. Cath site intact, no bleeding, no hematoma. Handoff to [facility].

## 2014-02-24 NOTE — Progress Notes (Signed)
Femoral sheat at l groin removed without complication. HOB flat, pedal pulses +2. Pt alert oriented denies need to void at this time. Pressure held to site x 25 min with hemostasis completed. 4x4 gauze placed with tegaderm. No obvious s/s hematoma. Advised pt to remain flat in bed until until staff notes otherwise. Advised pt to place pressure over bandage if the need for coughing should occur. Showed pt location of bandage in groin. Discussion indicates understanding.

## 2014-02-24 NOTE — CV Procedure (Signed)
    PERIPHERAL VASCULAR PROCEDURE  NAME:  [name]   MRN: [id number] DOB:  04/30/1951   ADMIT DATE: 02/24/2014  Performing Cardiologist: [name] Primary Physician: Odette Fraction, MD  Procedures Performed:  Abdominal Aortic Angiogram with Bi-Iliofemoral Runoff  Bilateral Lower Extremity Angiography (1st Order)    Indication(s):   Claudication  Critical Limb Ischemia   Consent: The procedure with Risks/Benefits/Alternatives and Indications was reviewed with the patient .  All questions were answered.  Medications:  Sedation:  1 mg IV Versed, 25 mcg IV Fentanyl  Contrast:  114 ml  Visipaque   Procedural details: The left groin was prepped, draped, and anesthetized with 1% lidocaine. Using modified Seldinger technique, a 5 French sheath was introduced into the left common femoral artery. A 5 Fr Short Pigtail Catheter was advanced of over a  Versicore wire into the descending Aorta to a level just above the renal arteries. A power injection of 37ml/sec contrast over 1 sec was performed for Abdominal Aortic Angiography.  The catheter was then pulled back to a level just above the Aortic bifurcation, and a second power injection was performed to evaluate the iliac arteries.   Bilateral lower extremity arterial run off was then performed via power injection of 7 ml / sec contrast for a total of 77 ml.   The catheter was then removed.  The patient tolerated the procedure well with no immediate complications.    Hemodynamics:  Central Aortic Pressure / Mean Aortic Pressure: 141/59  Findings:  Abdominal aorta: Normal in size with no evidence of aneurysm or significant atheroma  Left renal artery: Normal  Right renal artery: 60% proximal stenosis.  Celiac artery: Patent  Superior mesenteric artery: Patent  Right common iliac artery: Minor irregularities.  Right internal iliac artery: Normal  Right external iliac artery: Normal  Right common femoral  artery: Minor irregularities.  Right profunda femoral artery: Normal  Right superficial femoral artery: Diffusely diseased proximally followed by long occlusion to the distal SFA with reconstitution via collaterals from the profunda.  Right popliteal artery: Occluded proximally above the knee in a short segment  Three-vessel runoff below the knee not well-visualized the study due to significant motion.  Left common iliac artery:  20% proximal stenosis.  Left internal iliac artery: Normal  Left external iliac artery: Normal  Left common femoral artery: Minor irregularities.  Left profunda femoral artery: 20% proximal stenosis.  Left superficial femoral artery:  Mild atherosclerosis in the midsegment and distally.  Left popliteal artery: Normal  Three-vessel runoff below the knee.  Conclusions: 1. No significant aortoiliac disease. No significant infrainguinal disease on the left. 2. Long occlusion of the right SFA from the proximal to the distal segment as well as a short occlusion in the proximal popliteal artery with three-vessel runoff below the knee.  Recommendations:  I recommend an attempt at walking program, a trial of cilostazol and smoking cessation as well as treatment of risk factors. If the patient fails conservative therapy, right leg revascularization can be considered.   [name], MD, [facility] 02/24/2014 8:53 AM

## 2014-02-24 NOTE — Discharge Instructions (Signed)
Stop Plavix.  Start Pletal 50 mg twice daily ( A prescription was sent to [facility] in [place]).   Can resume work without restrictions on 03/03/2014   Arteriogram Care After These instructions give you information on caring for yourself after your procedure. Your doctor may also give you more specific instructions. Call your doctor if you have any problems or questions after your procedure. HOME CARE  Keep your leg straight for at least 6 hours.  Do not bathe, swim, or use a hot tub until directed by your doctor. You can shower.  Do not lift anything heavier than 10 pounds (about a gallon of milk) for 2 days.  Do not walk a lot, run, or drive for 2 days.  Return to normal activities in 2 days or as told by your doctor. Finding out the results of your test Ask when your test results will be ready. Make sure you get your test results. GET HELP RIGHT AWAY IF:   You have fever.  You have more pain in your leg.  The leg that was cut is:  Bleeding.  Puffy (swollen) or red.  Cold.  Pale or changes color.  Weak.  Tingly or numb. If you go to the Emergency Room, tell your nurse that you have had an arteriogram. Take this paper with you to show the nurse. MAKE SURE YOU:  Understand these instructions.  Will watch your condition.  Will get help right away if you are not doing well or get worse. Document Released: 09/07/2008 Document Revised: 06/16/2013 Document Reviewed: 09/07/2008 [facility] Patient Information 2015 [place], [place]. This information is not intended to replace advice given to you by your health care provider. Make sure you discuss any questions you have with your health care provider.

## 2014-02-24 NOTE — Progress Notes (Signed)
Bandage to l groin remains clean/dry, no evidence hematoma. Hob remains flat. Alert oriented. Denies need to void. Pedal pulse +2.

## 2014-03-18 ENCOUNTER — Encounter: Payer: BC Managed Care – PPO | Admitting: Cardiovascular Disease

## 2014-03-26 ENCOUNTER — Encounter: Payer: BC Managed Care – PPO | Admitting: Cardiovascular Disease

## 2014-04-02 ENCOUNTER — Ambulatory Visit (INDEPENDENT_AMBULATORY_CARE_PROVIDER_SITE_OTHER): Payer: BC Managed Care – PPO | Admitting: Cardiovascular Disease

## 2014-04-02 ENCOUNTER — Encounter: Payer: Self-pay | Admitting: Cardiovascular Disease

## 2014-04-02 VITALS — BP 148/68 | HR 80 | Ht 74.0 in | Wt 205.2 lb

## 2014-04-02 DIAGNOSIS — E785 Hyperlipidemia, unspecified: Secondary | ICD-10-CM

## 2014-04-02 MED ORDER — CILOSTAZOL 100 MG PO TABS: 100.0000 mg | ORAL_TABLET | Freq: Two times a day (BID) | ORAL | Status: DC

## 2014-04-02 MED ORDER — ATORVASTATIN CALCIUM 20 MG PO TABS: 20.0000 mg | ORAL_TABLET | Freq: Every day | ORAL | Status: DC

## 2014-04-02 NOTE — Progress Notes (Signed)
OFFICE NOTE  Chief Complaint:  Right leg pain  Primary Care Physician: Odette Fraction, MD  HPI:  Oscar Mills is a pleasant 63 year old male who is here today for a followup visit regarding  peripheral arterial disease. He is a smoker with hypertension and a family history of heart disease. Over the past 2 years he's had pain in his right leg which has become progressively worse. When he walks he gets pain in his calf that does improve after resting. He starts walking again his pain comes back. This usually happens after walking about 100 feet Quickly. He denies any pain at rest. He does note that his feet are slightly cool particularly on the right. He denies any chest pain or shortness of breath with exertion. He recently had Dopplers of the right leg which indicated an ABI of 0.44 on the right and 1.2 on the left. The right TBI was 0.35 and 1.0 the left.  I proceeded with angiography which showed: 1. No significant aortoiliac disease. No significant infrainguinal disease on the left.  2. Long occlusion of the right SFA from the proximal to the distal segment as well as a short occlusion in the proximal popliteal artery with three-vessel runoff below the knee.   I started him on Pletal and asked him to start a walking exercise program. I strongly advised him to quit smoking. He is down to just a few cigarettes a day. He is walking regularly. He is tolerating Pletal and reports improvement in claudication. He denies chest pain or shortness of breath.   PMHx:  Past Medical History  Diagnosis Date  . Pancreatitis   . Dysfunctional alcohol use     now quit  . Hypertension     Past Surgical History  Procedure Laterality Date  . Peripheral vascular catheterization      FAMHx:  Family History  Problem Relation Age of Onset  . Heart attack Father     SOCHx:   reports that he has been smoking Cigarettes.  He has a 7.5 pack-year smoking history. He has never used  smokeless tobacco. He reports that he does not drink alcohol or use illicit drugs.  ALLERGIES:  Allergies  Allergen Reactions  . Iohexol      Code: HIVES, Desc: HIVES WITH OMNIPAQUE 300 OBSERVED BY DR Alvester Chou NO MEDS GIVEN     ROS: A comprehensive review of systems was negative except for: Musculoskeletal: positive for right leg claudication  HOME MEDS: Current Outpatient Prescriptions  Medication Sig Dispense Refill  . acetaminophen (TYLENOL) 500 MG tablet Take 1,500 mg by mouth 2 (two) times daily.       Marland Kitchen amLODipine (NORVASC) 10 MG tablet Take 10 mg by mouth daily.      Marland Kitchen aspirin 81 MG tablet Take 81 mg by mouth daily.      . cilostazol (PLETAL) 50 MG tablet Take 1 tablet (50 mg total) by mouth 2 (two) times daily.  60 tablet  2  . pantoprazole (PROTONIX) 40 MG tablet Take 1 tablet (40 mg total) by mouth daily.  30 tablet  11   No current facility-administered medications for this visit.    LABS/IMAGING: No results found for this or any previous visit (from the past 48 hour(s)). No results found.  VITALS: Ht 6\' 2"  (1.88 m)  Wt 205 lb 4 oz (93.101 kg)  BMI 26.34 kg/m2  EXAM: General appearance: alert and no distress Neck: no carotid bruit and no JVD Lungs: clear to  auscultation bilaterally Heart: regular rate and rhythm, S1, S2 normal, no murmur, click, rub or gallop Abdomen: soft, non-tender; bowel sounds normal; no masses,  no organomegaly Extremities: extremities normal, atraumatic, no cyanosis or edema Pulses: right DP/PT are absent and palpable on the left. Femoral pulses are normal bilaterally. Radial pulses are normal bilaterally. No groin hematoma Skin: Skin color, texture, turgor normal. No rashes or lesions Neurologic: Grossly normal Psych: Normal    ASSESSMENT: 1. Symptomatic severe right lower extremity claudication with ABI 0.44 and TBI 0.35 (Rutherford class 3).  2. Ongoing tobacco abuse 3. Hypertension 4. Family history heart disease   PLAN: 1.    [name] has severe right lower extremity claudication with severely reduced ABI DUE to long occlusion of the SFA as well as significant disease in the popliteal artery. Claudication is improving with Pletal and walking exercise program. We again discussed the importance of smoking cessation. Even though his LDL was not elevated, he would benefit from being on a statin. I started him on atorvastatin 20 mg once daily. Check fasting lipid and liver profile in 6 weeks. I increased the dose of Pletal to 100 mg twice daily. Reevaluate symptoms in 3 months. If no significant improvement in claudication, revascularization can be considered.    [name] 04/02/2014, 8:10 AM

## 2014-04-02 NOTE — Patient Instructions (Signed)
Your physician has recommended you make the following change in your medication:  Increase Pletal to 100 mg twice daily  Start Atorvastatin 20 mg once daily   Your physician recommends that you return for lab work in:  6 weeks for fasting lipid and liver   Your physician recommends that you schedule a follow-up appointment in:  3 months

## 2014-05-14 ENCOUNTER — Other Ambulatory Visit: Payer: BC Managed Care – PPO

## 2014-05-14 DIAGNOSIS — E785 Hyperlipidemia, unspecified: Secondary | ICD-10-CM

## 2014-05-15 LAB — HEPATIC FUNCTION PANEL
ALT: 11 IU/L (ref 0–44)
AST: 15 IU/L (ref 0–40)
Albumin: 5 g/dL — ABNORMAL HIGH (ref 3.6–4.8)
Alkaline Phosphatase: 78 IU/L (ref 39–117)
Bilirubin, Direct: 0.1 mg/dL (ref 0.00–0.40)
Total Bilirubin: 0.3 mg/dL (ref 0.0–1.2)
Total Protein: 7.6 g/dL (ref 6.0–8.5)

## 2014-05-15 LAB — LIPID PANEL
Chol/HDL Ratio: 2.5 ratio units (ref 0.0–5.0)
Cholesterol, Total: 94 mg/dL — ABNORMAL LOW (ref 100–199)
HDL: 38 mg/dL — ABNORMAL LOW (ref >39)
LDL Calculated: 36 mg/dL (ref 0–99)
Triglycerides: 100 mg/dL (ref 0–149)
VLDL Cholesterol Cal: 20 mg/dL (ref 5–40)

## 2014-05-17 ENCOUNTER — Telehealth: Payer: Self-pay | Admitting: *Deleted

## 2014-05-17 MED ORDER — ATORVASTATIN CALCIUM 10 MG PO TABS: 10.0000 mg | ORAL_TABLET | Freq: Every day | ORAL | Status: DC

## 2014-05-17 NOTE — Telephone Encounter (Signed)
Inform patient that labs were normal. Cholesterol was very good but low. Decrease Atorvastatin to 10 mg daily  Per Dr Fletcher Anon result note

## 2014-06-03 ENCOUNTER — Encounter (HOSPITAL_COMMUNITY): Payer: Self-pay | Admitting: Cardiovascular Disease

## 2014-06-14 ENCOUNTER — Other Ambulatory Visit: Payer: Self-pay | Admitting: Cardiovascular Disease

## 2014-06-15 ENCOUNTER — Other Ambulatory Visit: Payer: Self-pay | Admitting: *Deleted

## 2014-06-15 MED ORDER — CILOSTAZOL 100 MG PO TABS: 100.0000 mg | ORAL_TABLET | Freq: Two times a day (BID) | ORAL | Status: DC

## 2014-10-09 ENCOUNTER — Other Ambulatory Visit: Payer: Self-pay | Admitting: Cardiovascular Disease

## 2014-11-29 ENCOUNTER — Other Ambulatory Visit: Payer: Self-pay | Admitting: Cardiovascular Disease

## 2014-11-29 NOTE — Telephone Encounter (Signed)
LMOM to contact our office for a follow up with Dr. Fletcher Anon.

## 2014-11-29 NOTE — Telephone Encounter (Signed)
Pt needs colostazol refilled. Only has 1 pill left

## 2015-01-02 ENCOUNTER — Other Ambulatory Visit: Payer: Self-pay | Admitting: Family Medicine

## 2015-01-03 ENCOUNTER — Encounter: Payer: Self-pay | Admitting: Family Medicine

## 2015-01-03 NOTE — Telephone Encounter (Signed)
Medication refill for one time only.  Patient needs to be seen.  Letter sent for patient to call and schedule.  Has been over one year.

## 2015-01-06 ENCOUNTER — Ambulatory Visit: Payer: Self-pay | Admitting: Cardiovascular Disease

## 2015-01-10 ENCOUNTER — Other Ambulatory Visit: Payer: Self-pay | Admitting: Cardiovascular Disease

## 2015-02-04 ENCOUNTER — Other Ambulatory Visit: Payer: Self-pay | Admitting: Family Medicine

## 2015-02-08 ENCOUNTER — Telehealth: Payer: Self-pay | Admitting: *Deleted

## 2015-02-08 MED ORDER — AMLODIPINE BESYLATE 10 MG PO TABS: 10.0000 mg | ORAL_TABLET | Freq: Every day | ORAL | Status: DC

## 2015-02-08 NOTE — Telephone Encounter (Signed)
°  1. Which medications need to be refilled? AmLODIPine Besylate  2. Which pharmacy is medication to be sent to? [facility] on [address]   3. Do they need a 30 day or 90 day supply? 30   4. Would they like a call back once the medication has been sent to the pharmacy? No

## 2015-02-08 NOTE — Telephone Encounter (Signed)
Refill sent for amlodipine.  

## 2015-02-14 ENCOUNTER — Other Ambulatory Visit: Payer: Self-pay | Admitting: Cardiovascular Disease

## 2015-02-18 ENCOUNTER — Ambulatory Visit (INDEPENDENT_AMBULATORY_CARE_PROVIDER_SITE_OTHER): Payer: 59 | Admitting: Cardiovascular Disease

## 2015-02-18 ENCOUNTER — Encounter: Payer: Self-pay | Admitting: Cardiovascular Disease

## 2015-02-18 VITALS — BP 120/72 | HR 67 | Ht 74.0 in | Wt 202.5 lb

## 2015-02-18 DIAGNOSIS — I739 Peripheral vascular disease, unspecified: Secondary | ICD-10-CM | POA: Diagnosis not present

## 2015-02-18 DIAGNOSIS — I159 Secondary hypertension, unspecified: Secondary | ICD-10-CM

## 2015-02-18 NOTE — Progress Notes (Signed)
OFFICE NOTE  Chief Complaint:  Right leg pain  Primary Care Physician: Odette Fraction, MD  HPI:  Oscar Mills is a pleasant 64 year old male who is here today for a followup visit regarding  peripheral arterial disease. He is a smoker with hypertension and a family history of heart disease. He has known occlusion of the right SFA with right calf claudication. Noninvasive vascular evaluation in June 2015 indicated an ABI of 0.44 on the right and 1.2 on the left. The right TBI was 0.35 and 1.0 the left.  I proceeded with angiography in September 2015 which showed: 1. No significant aortoiliac disease. No significant infrainguinal disease on the left.  2. Long occlusion of the right SFA from the proximal to the distal segment as well as a short occlusion in the proximal popliteal artery with three-vessel runoff below the knee.   I started him on Pletal and asked him to start a walking exercise program. I strongly advised him to quit smoking. He is down to just a few cigarettes a day. He is walking regularly. He is tolerating Pletal and reports improvement in claudication. He denies chest pain or shortness of breath. He is able to walk a quarter of a mile without significant pain. Even when he gets pain in his right calf, he is usually able to walk through it.   PMHx:  Past Medical History  Diagnosis Date  . Pancreatitis   . Dysfunctional alcohol use     now quit  . Hypertension     Past Surgical History  Procedure Laterality Date  . Peripheral vascular catheterization    . Lower extremity angiogram Bilateral 02/24/2014    Procedure: LOWER EXTREMITY ANGIOGRAM;  Surgeon: Wellington Hampshire, MD;  Location: [facility];  Service: Cardiovascular;  Laterality: Bilateral;    FAMHx:  Family History  Problem Relation Age of Onset  . Heart attack Father     SOCHx:   reports that he has been smoking Cigarettes.  He has a 7.5 pack-year smoking history. He has never used  smokeless tobacco. He reports that he does not drink alcohol or use illicit drugs.  ALLERGIES:  Allergies  Allergen Reactions  . Iohexol      Code: HIVES, Desc: HIVES WITH OMNIPAQUE 300 OBSERVED BY DR Alvester Chou NO MEDS GIVEN     ROS: A comprehensive review of systems was negative except for: Musculoskeletal: positive for right leg claudication  HOME MEDS: Current Outpatient Prescriptions  Medication Sig Dispense Refill  . acetaminophen (TYLENOL) 500 MG tablet Take 1,500 mg by mouth 2 (two) times daily.     Marland Kitchen amLODipine (NORVASC) 10 MG tablet Take 1 tablet (10 mg total) by mouth daily. 90 tablet 3  . aspirin 81 MG tablet Take 81 mg by mouth daily.    Marland Kitchen atorvastatin (LIPITOR) 10 MG tablet TAKE 1 TABLET BY MOUTH EVERY DAY( CONTACT OFFICE TO SCHEDULE FUTURE APPOINTMENT AND REFILLS) 90 tablet 0  . cilostazol (PLETAL) 100 MG tablet TAKE 1 TABLET BY MOUTH TWICE DAILY 60 tablet 3  . pantoprazole (PROTONIX) 40 MG tablet TAKE 1 TABLET BY MOUTH DAILY 30 tablet 0   No current facility-administered medications for this visit.    LABS/IMAGING: No results found for this or any previous visit (from the past 48 hour(s)). No results found.  VITALS: Ht 6\' 2"  (1.88 m)  Wt 202 lb 8 oz (91.853 kg)  BMI 25.99 kg/m2  EXAM: General appearance: alert and no distress Neck: no carotid bruit  and no JVD Lungs: clear to auscultation bilaterally Heart: regular rate and rhythm, S1, S2 normal, no murmur, click, rub or gallop Abdomen: soft, non-tender; bowel sounds normal; no masses,  no organomegaly Extremities: extremities normal, atraumatic, no cyanosis or edema Pulses: right DP/PT are absent and palpable on the left. Femoral pulses are normal bilaterally. Radial pulses are normal bilaterally. Distal pulses are not palpable on the right. Skin: Skin color, texture, turgor normal. No rashes or lesions Neurologic: Grossly normal Psych: Normal    ASSESSMENT: 1. Symptomatic severe right lower extremity  claudication with ABI 0.44 and TBI 0.35 (Rutherford class 3).  2. Ongoing tobacco abuse 3. Hypertension 4. Family history heart disease   PLAN: 1.   [name] has severe right lower extremity claudication with severely reduced ABI DUE to long occlusion of the SFA as well as significant disease in the popliteal artery. Claudication improved significantly with Pletal and a walking exercise program.  Currently, his claudication is mild overall and does not seem to be lifestyle limiting.  Continue treatment with atorvastatin. Continue Pletal  100 mg twice daily. He has no symptoms suggestive of coronary artery disease. He is currently on optimal medical therapy. I again discussed with him the importance of smoking cessation and he thinks he can quit on his own. He is down to a few cigarettes a day.    [name] 02/18/2015, 11:29 AM

## 2015-02-18 NOTE — Patient Instructions (Signed)
Medication Instructions: Continue same medications.   Labwork: None.   Procedures/Testing: None.   Follow-Up: 1 year with Dr. Justine Cossin  Any Additional Special Instructions Will Be Listed Below (If Applicable).   

## 2015-02-28 ENCOUNTER — Other Ambulatory Visit: Payer: Self-pay | Admitting: Cardiovascular Disease

## 2015-03-20 ENCOUNTER — Emergency Department: Payer: 59

## 2015-03-20 ENCOUNTER — Emergency Department
Admission: EM | Admit: 2015-03-20 | Discharge: 2015-03-20 | Disposition: A | Discharge status: Home / Self Care | Payer: 59 | Attending: Emergency Medicine | Admitting: Emergency Medicine

## 2015-03-20 ENCOUNTER — Encounter: Payer: Self-pay | Admitting: *Deleted

## 2015-03-20 DIAGNOSIS — Z7982 Long term (current) use of aspirin: Secondary | ICD-10-CM | POA: Insufficient documentation

## 2015-03-20 DIAGNOSIS — Z79899 Other long term (current) drug therapy: Secondary | ICD-10-CM | POA: Diagnosis not present

## 2015-03-20 DIAGNOSIS — T1502XA Foreign body in cornea, left eye, initial encounter: Secondary | ICD-10-CM | POA: Diagnosis not present

## 2015-03-20 DIAGNOSIS — Y998 Other external cause status: Secondary | ICD-10-CM | POA: Insufficient documentation

## 2015-03-20 DIAGNOSIS — Y288XXA Contact with other sharp object, undetermined intent, initial encounter: Secondary | ICD-10-CM | POA: Diagnosis not present

## 2015-03-20 DIAGNOSIS — Y9289 Other specified places as the place of occurrence of the external cause: Secondary | ICD-10-CM | POA: Insufficient documentation

## 2015-03-20 DIAGNOSIS — I10 Essential (primary) hypertension: Secondary | ICD-10-CM | POA: Diagnosis not present

## 2015-03-20 DIAGNOSIS — Y9389 Activity, other specified: Secondary | ICD-10-CM | POA: Insufficient documentation

## 2015-03-20 DIAGNOSIS — M795 Residual foreign body in soft tissue: Secondary | ICD-10-CM

## 2015-03-20 DIAGNOSIS — S0502XA Injury of conjunctiva and corneal abrasion without foreign body, left eye, initial encounter: Secondary | ICD-10-CM

## 2015-03-20 DIAGNOSIS — T1592XA Foreign body on external eye, part unspecified, left eye, initial encounter: Secondary | ICD-10-CM

## 2015-03-20 DIAGNOSIS — Z720 Tobacco use: Secondary | ICD-10-CM | POA: Diagnosis not present

## 2015-03-20 MED ORDER — KETOROLAC TROMETHAMINE 0.5 % OP SOLN: 1.0000 [drp] | Freq: Four times a day (QID) | OPHTHALMIC | Status: DC

## 2015-03-20 MED ORDER — FLUORESCEIN SODIUM 1 MG OP STRP
ORAL_STRIP | OPHTHALMIC | Status: AC
  Administered 2015-03-20: 08:00:00
  Filled 2015-03-20: qty 1

## 2015-03-20 MED ORDER — HYDROCODONE-ACETAMINOPHEN 5-325 MG PO TABS
1.0000 | ORAL_TABLET | Freq: Once | ORAL | Status: AC
  Administered 2015-03-20: 1 via ORAL
  Filled 2015-03-20: qty 1

## 2015-03-20 MED ORDER — CIPROFLOXACIN HCL 0.3 % OP SOLN: 2.0000 [drp] | OPHTHALMIC | Status: AC

## 2015-03-20 MED ORDER — CIPROFLOXACIN HCL 0.3 % OP SOLN
2.0000 [drp] | OPHTHALMIC | Status: DC
  Administered 2015-03-20: 2 [drp] via OPHTHALMIC
  Filled 2015-03-20: qty 2.5

## 2015-03-20 MED ORDER — FLUORESCEIN SODIUM 1 MG OP STRP
1.0000 | ORAL_STRIP | Freq: Once | OPHTHALMIC | Status: AC
  Administered 2015-03-20: 1 via OPHTHALMIC

## 2015-03-20 MED ORDER — TETRACAINE HCL 0.5 % OP SOLN
2.0000 [drp] | Freq: Once | OPHTHALMIC | Status: AC
  Administered 2015-03-20: 2 [drp] via OPHTHALMIC
  Filled 2015-03-20: qty 2

## 2015-03-20 NOTE — ED Provider Notes (Signed)
[facility] Emergency Department Provider Note ____________________________________________  Time seen: Approximately 7:50 AM  I have reviewed the triage vital signs and the nursing notes.   HISTORY  Chief Complaint Foreign Body in Eye   HPI Oscar Mills is a 64 y.o. male percents to the emergency department with redness and left eye pain. He states that he was grinding metal on Friday and felt something hit his eye. He felt that the metal had just bounced off the but is now having significant pain in the left eye. He has had metal in his eye before and is familiar with the feeling.   Past Medical History  Diagnosis Date  . Pancreatitis   . Dysfunctional alcohol use     now quit  . Hypertension     Patient Active Problem List   Diagnosis Date Noted  . Claudication of right lower extremity 01/21/2014  . PAD (peripheral artery disease) 01/21/2014  . Smoker 10/15/2012  . Pancreatitis     Past Surgical History  Procedure Laterality Date  . Peripheral vascular catheterization    . Lower extremity angiogram Bilateral 02/24/2014    Procedure: LOWER EXTREMITY ANGIOGRAM;  Surgeon: Wellington Hampshire, MD;  Location: [facility];  Service: Cardiovascular;  Laterality: Bilateral;    Current Outpatient Rx  Name  Route  Sig  Dispense  Refill  . acetaminophen (TYLENOL) 500 MG tablet   Oral   Take 1,500 mg by mouth 2 (two) times daily.          Marland Kitchen amLODipine (NORVASC) 10 MG tablet   Oral   Take 1 tablet (10 mg total) by mouth daily.   90 tablet   3     **Patient requests 90 days supply**   . aspirin 81 MG tablet   Oral   Take 81 mg by mouth daily.         Marland Kitchen atorvastatin (LIPITOR) 10 MG tablet      TAKE 1 TABLET BY MOUTH EVERY DAY( CONTACT OFFICE TO SCHEDULE FUTURE APPOINTMENT AND REFILLS)   90 tablet   0     **Patient requests 90 days supply**   . cilostazol (PLETAL) 100 MG tablet      TAKE 1 TABLET BY MOUTH TWICE DAILY   60 tablet  3     Patient needs to make a follow up appointment. Pat ...   . ciprofloxacin (CILOXAN) 0.3 % ophthalmic solution   Left Eye   Place 2 drops into the left eye every 4 (four) hours while awake. Administer 1 drop, every 2 hours, while awake, for 2 days. Then 1 drop, every 4 hours, while awake, for the next 5 days.   5 mL   0   . ketorolac (ACULAR) 0.5 % ophthalmic solution   Left Eye   Place 1 drop into the left eye 4 (four) times daily.   5 mL   0   . pantoprazole (PROTONIX) 40 MG tablet      TAKE 1 TABLET BY MOUTH DAILY   90 tablet   3     **Patient requests 90 days supply**     Allergies Iohexol  Family History  Problem Relation Age of Onset  . Heart attack Father     Social History Social History  Substance Use Topics  . Smoking status: Current Every Day Smoker -- 0.25 packs/day for 30 years    Types: Cigarettes  . Smokeless tobacco: Never Used  . Alcohol Use: No  Review of Systems   Constitutional: No fever/chills Eyes: Visual changes: no. ENT: No sore throat. Cardiovascular: Denies chest pain. Respiratory: Denies shortness of breath. Gastrointestinal: No abdominal pain.  No nausea, no vomiting.  No diarrhea.  No constipation. Musculoskeletal: Negative for pain. Skin: Negative for rash. Neurological: Negative for headaches, focal weakness or numbness. Psychiatric:At baseline, no complaint Lymphatic:Swollen nodes-- no Allergic: Seasonal allergies: no 10-point ROS otherwise negative.  ____________________________________________  PHYSICAL EXAM:  VITAL SIGNS: ED Triage Vitals  Enc Vitals Group     BP 03/20/15 0650 154/71 mmHg     Pulse --      Resp 03/20/15 0650 16     Temp 03/20/15 0650 98.1 F (36.7 C)     Temp Source 03/20/15 0650 Oral     SpO2 03/20/15 0650 96 %     Weight 03/20/15 0650 200 lb (90.719 kg)     Height 03/20/15 0650 6\' 2"  (1.88 m)     Head Cir --      Peak Flow --      Pain Score 03/20/15 0651 8     Pain Loc --       Pain Edu? --      Excl. in [place]? --     Constitutional: Alert and oriented. Well appearing and in no acute distress. Eyes: Visual acuity--see nursing documentation; No globe trauma; Eyelids normal to inspection; Everted for exam yes; Conjunctiva and sclera: Erythematous; Corneas: Metal speck noted at approximately 3:00; Examined with fluorescein no; EOM's intact; Pupils PERRLA; Anterior Chambers normal with limited exam.  Head: Atraumatic. Nose: No congestion/rhinnorhea. Mouth/Throat: Mucous membranes are moist.  Oropharynx non-erythematous. Neck: No stridor.  Cardiovascular: Normal rate, regular rhythm. Grossly normal heart sounds.  Good peripheral circulation. Respiratory: Normal respiratory effort.  No retractions. Gastrointestinal: Soft and nontender. No distention. No abdominal bruits. No CVA tenderness. Musculoskeletal:Normal ROM Neurologic:  Normal speech and language. No gross focal neurologic deficits are appreciated. Speech is normal. No gait instability. Skin:  Skin is warm, dry and intact. No rash noted. Psychiatric: Mood and affect are normal. Speech and behavior are normal.  ____________________________________________   LABS (all labs ordered are listed, but only abnormal results are displayed)  Labs Reviewed - No data to display ____________________________________________  EKG   ____________________________________________  RADIOLOGY  Not indicated ____________________________________________   PROCEDURES  Procedure(s) performed:   Foreign Body Removal:  Procedure explained and permission received from the patient. Body Part: Left eye Anesthesia: Tetracaine Supplies: Cotton swab/insulin needle Technique: Tip of insulin needle used to flick the metal that was personally embedded in the left cornea at approximately the 3:00 position Procedure was successful Type of foreign body removed: Black fleck, assumed to be metal  Patient tolerated the  procedure well with no immediate complications.  ____________________________________________   INITIAL IMPRESSION / ASSESSMENT AND PLAN / ED COURSE  Pertinent labs & imaging results that were available during my care of the patient were reviewed by me and considered in my medical decision making (see chart for details).   Patient was advised to follow up with ophthalmology in 2 days. He was  also advised to return to the ER for symptoms that change or worsen if unable to schedule an appointment. He will use ciprofloxacin ophthalmic drops and Acular.  ____________________________________________   FINAL CLINICAL IMPRESSION(S) / ED DIAGNOSES  Final diagnoses:  Foreign body, eye, left, initial encounter  Corneal abrasion, left, initial encounter       Victorino Dike, FNP 03/20/15 0906  Doren Custard  Joni Fears, MD 03/20/15 [phone]

## 2015-03-20 NOTE — ED Notes (Signed)
Pt presents w/ redness, swelling, pain, and drainage from L eye. Pt states he did some metal grinding Friday morning and feels as if he has a piece of metal in his eye.

## 2015-03-21 ENCOUNTER — Telehealth: Payer: Self-pay | Admitting: *Deleted

## 2015-03-21 NOTE — Telephone Encounter (Signed)
Submitted referral thru [facility] compass to Dr. Birder Robson [facility] with referral number [phone]  Type of referral: consult and treat  Number of visits: 6  Start date: 03/21/15  End date: 09/18/15  Dx: T15.00XA- Foreign body in cornea,unspecified eye,initial encounter       S05.02XD- Injury of conjuctiva and corneal abrasion without foreign body,left eye  Copy has been faxed to [facility] center in [place]

## 2015-03-27 ENCOUNTER — Other Ambulatory Visit: Payer: Self-pay | Admitting: Cardiovascular Disease

## 2015-04-14 ENCOUNTER — Other Ambulatory Visit: Payer: Self-pay | Admitting: Cardiovascular Disease

## 2015-04-24 ENCOUNTER — Other Ambulatory Visit: Payer: Self-pay | Admitting: Cardiovascular Disease

## 2015-05-26 ENCOUNTER — Other Ambulatory Visit: Payer: Self-pay | Admitting: Cardiovascular Disease

## 2015-06-17 ENCOUNTER — Other Ambulatory Visit: Payer: Self-pay | Admitting: Cardiovascular Disease

## 2015-07-15 ENCOUNTER — Other Ambulatory Visit: Payer: Self-pay | Admitting: Cardiovascular Disease

## 2015-11-23 ENCOUNTER — Other Ambulatory Visit: Payer: Self-pay

## 2015-11-23 MED ORDER — AMLODIPINE BESYLATE 10 MG PO TABS: 10.0000 mg | ORAL_TABLET | Freq: Every day | ORAL | Status: DC

## 2015-11-23 MED ORDER — CILOSTAZOL 100 MG PO TABS: 100.0000 mg | ORAL_TABLET | Freq: Two times a day (BID) | ORAL | Status: DC

## 2016-01-20 ENCOUNTER — Ambulatory Visit: Payer: Self-pay | Admitting: Cardiovascular Disease

## 2016-02-15 ENCOUNTER — Other Ambulatory Visit: Payer: Self-pay | Admitting: Cardiovascular Disease

## 2016-03-01 ENCOUNTER — Encounter: Payer: Self-pay | Admitting: Cardiovascular Disease

## 2016-03-01 ENCOUNTER — Ambulatory Visit (INDEPENDENT_AMBULATORY_CARE_PROVIDER_SITE_OTHER): Payer: BLUE CROSS/BLUE SHIELD | Admitting: Cardiovascular Disease

## 2016-03-01 ENCOUNTER — Other Ambulatory Visit
Admission: RE | Admit: 2016-03-01 | Discharge: 2016-03-01 | Disposition: A | Discharge status: Home / Self Care | Payer: BLUE CROSS/BLUE SHIELD | Source: Ambulatory Visit | Attending: Cardiovascular Disease | Admitting: Cardiovascular Disease

## 2016-03-01 VITALS — BP 126/60 | HR 68 | Ht 74.0 in | Wt 197.2 lb

## 2016-03-01 DIAGNOSIS — E785 Hyperlipidemia, unspecified: Secondary | ICD-10-CM | POA: Diagnosis not present

## 2016-03-01 DIAGNOSIS — Z720 Tobacco use: Secondary | ICD-10-CM

## 2016-03-01 DIAGNOSIS — I10 Essential (primary) hypertension: Secondary | ICD-10-CM | POA: Diagnosis not present

## 2016-03-01 DIAGNOSIS — I739 Peripheral vascular disease, unspecified: Secondary | ICD-10-CM | POA: Diagnosis not present

## 2016-03-01 LAB — HEPATIC FUNCTION PANEL
ALT: 11 U/L — ABNORMAL LOW (ref 17–63)
AST: 15 U/L (ref 15–41)
Albumin: 4.3 g/dL (ref 3.5–5.0)
Alkaline Phosphatase: 59 U/L (ref 38–126)
Bilirubin, Direct: <0.1 mg/dL — ABNORMAL LOW (ref 0.1–0.5)
Total Bilirubin: 0.4 mg/dL (ref 0.3–1.2)
Total Protein: 7.2 g/dL (ref 6.5–8.1)

## 2016-03-01 LAB — LIPID PANEL
Cholesterol: 84 mg/dL (ref 0–200)
HDL: 30 mg/dL — ABNORMAL LOW (ref >40)
LDL Cholesterol: 40 mg/dL (ref 0–99)
Total CHOL/HDL Ratio: 2.8 RATIO
Triglycerides: 71 mg/dL (ref <150)
VLDL: 14 mg/dL (ref 0–40)

## 2016-03-01 NOTE — Progress Notes (Signed)
Cardiology Office Note   Date:  03/01/2016   ID:  Oscar Mills, DOB [birth year], MRN [id number]  PCP:  Odette Fraction, MD  Cardiologist:   [name], MD   Chief Complaint  Patient presents with  . Other    6 month f/u. Meds reviewed verbally with pt.      History of Present Illness: Oscar Mills is a 64 y.o. male who presents for a followup visit regarding  peripheral arterial disease. He is a smoker with hypertension and a family history of heart disease. He has known occlusion of the right SFA and proximal popliteal artery with right calf claudication. Noninvasive vascular evaluation in June 2015 indicated an ABI of 0.44 on the right and 1.2 on the left.   angiographyIn September 2015 showed 1. No significant aortoiliac disease. No significant infrainguinal disease on the left.  2. Long occlusion of the right SFA from the proximal to the distal segment as well as a short occlusion in the proximal popliteal artery with three-vessel runoff below the knee.   He has been treated medically with cilostazol and currently reports stable symptoms. He is able to walk one quarter of a mile without significant symptoms. After that, he has to rest for few minutes before he can resume. He continues to smoke but is down to 3-4 cigarettes a day. He denies any chest pain or shortness of breath.   Past Medical History:  Diagnosis Date  . Dysfunctional alcohol use    now quit  . Hypertension   . Pancreatitis     Past Surgical History:  Procedure Laterality Date  . LOWER EXTREMITY ANGIOGRAM Bilateral 02/24/2014   Procedure: LOWER EXTREMITY ANGIOGRAM;  Surgeon: Wellington Hampshire, MD;  Location: [facility];  Service: Cardiovascular;  Laterality: Bilateral;  . PERIPHERAL VASCULAR CATHETERIZATION       Current Outpatient Prescriptions  Medication Sig Dispense Refill  . acetaminophen (TYLENOL) 500 MG tablet Take 1,500 mg by mouth 2 (two) times daily.     Marland Kitchen amLODipine  (NORVASC) 10 MG tablet Take 1 tablet (10 mg total) by mouth daily. 90 tablet 1  . aspirin 81 MG tablet Take 81 mg by mouth daily.    Marland Kitchen atorvastatin (LIPITOR) 10 MG tablet TAKE 1 TABLET BY MOUTH EVERY DAY( CONTACT OFFICE TO SCHEDULE FUTURE APPOINTMENT AND REFILLS) 90 tablet 3  . cilostazol (PLETAL) 100 MG tablet Take 1 tablet (100 mg total) by mouth 2 (two) times daily. 180 tablet 1  . pantoprazole (PROTONIX) 40 MG tablet TAKE 1 TABLET BY MOUTH DAILY 90 tablet 0   No current facility-administered medications for this visit.     Allergies:   Iohexol    Social History:  The patient  reports that he has been smoking Cigarettes.  He has a 7.50 pack-year smoking history. He has never used smokeless tobacco. He reports that he does not drink alcohol or use drugs.   Family History:  The patient's family history includes Heart attack in his father.    ROS:  Please see the history of present illness.   Otherwise, review of systems are positive for none.   All other systems are reviewed and negative.    PHYSICAL EXAM: VS:  BP 126/60 (BP Location: Left Arm, Patient Position: Sitting, Cuff Size: Normal)   Pulse 68   Ht 6\' 2"  (1.88 m)   Wt 197 lb 4 oz (89.5 kg)   BMI 25.33 kg/m  , BMI Body mass index is 25.33  kg/m. GEN: Well nourished, well developed, in no acute distress  HEENT: normal  Neck: no JVD, carotid bruits, or masses Cardiac: RRR; no murmurs, rubs, or gallops,no edema  Respiratory:  clear to auscultation bilaterally, normal work of breathing GI: soft, nontender, nondistended, + BS MS: no deformity or atrophy  Skin: warm and dry, no rash Neuro:  Strength and sensation are intact Psych: euthymic mood, full affect   EKG:  EKG is ordered today. The ekg ordered today demonstrates normal sinus rhythm with no significant ST or T wave changes.   Recent Labs: No results found for requested labs within last 8760 hours.    Lipid Panel    Component Value Date/Time   CHOL 94 (L)  05/14/2014 0829   TRIG 100 05/14/2014 0829   HDL 38 (L) 05/14/2014 0829   CHOLHDL 2.5 05/14/2014 0829   LDLCALC 36 05/14/2014 0829      Wt Readings from Last 3 Encounters:  03/01/16 197 lb 4 oz (89.5 kg)  03/20/15 200 lb (90.7 kg)  02/18/15 202 lb 8 oz (91.9 kg)       No flowsheet data found.    ASSESSMENT AND PLAN:  1.  Peripheral arterial disease: Moderate right calf claudication due to known SFA and popliteal artery occlusions. His symptoms are stable overall and he does not feel that the claudication is lifestyle limiting. Continue treatment with cilostazol.  2. Essential hypertension: Blood pressure is controlled on amlodipine.  3. Hyperlipidemia: Continue treatment with atorvastatin. Most recent LDL was 36 and 2015. I requested lipid and liver profile today.  4. Tobacco use: He is down to 3-4 cigarettes a day. I discussed with him the importance of complete smoking cessation.    Disposition:   FU with me in 1 year  Signed,  [name], MD  03/01/2016 9:02 AM    [place]

## 2016-03-01 NOTE — Patient Instructions (Addendum)
Medication Instructions:  Your physician recommends that you continue on your current medications as directed. Please refer to the Current Medication list given to you today.   Labwork: Lipid and liver profile today  Testing/Procedures: none  Follow-Up: Your physician wants you to follow-up in: 1 year with Dr. Fletcher Anon.  You will receive a reminder letter in the mail two months in advance. If you don't receive a letter, please call our office to schedule the follow-up appointment.   Any Other Special Instructions Will Be Listed Below (If Applicable).     If you need a refill on your cardiac medications before your next appointment, please call your pharmacy.   Steps to Quit Smoking  Smoking tobacco can be harmful to your health and can affect almost every organ in your body. Smoking puts you, and those around you, at risk for developing many serious chronic diseases. Quitting smoking is difficult, but it is one of the best things that you can do for your health. It is never too late to quit. WHAT ARE THE BENEFITS OF QUITTING SMOKING? When you quit smoking, you lower your risk of developing serious diseases and conditions, such as:  Lung cancer or lung disease, such as COPD.  Heart disease.  Stroke.  Heart attack.  Infertility.  Osteoporosis and bone fractures. Additionally, symptoms such as coughing, wheezing, and shortness of breath may get better when you quit. You may also find that you get sick less often because your body is stronger at fighting off colds and infections. If you are pregnant, quitting smoking can help to reduce your chances of having a baby of low birth weight. HOW DO I GET READY TO QUIT? When you decide to quit smoking, create a plan to make sure that you are successful. Before you quit:  Pick a date to quit. Set a date within the next two weeks to give you time to prepare.  Write down the reasons why you are quitting. Keep this list in places where you  will see it often, such as on your bathroom mirror or in your car or wallet.  Identify the people, places, things, and activities that make you want to smoke (triggers) and avoid them. Make sure to take these actions:  Throw away all cigarettes at home, at work, and in your car.  Throw away smoking accessories, such as Scientist, research (medical).  Clean your car and make sure to empty the ashtray.  Clean your home, including curtains and carpets.  Tell your family, friends, and coworkers that you are quitting. Support from your loved ones can make quitting easier.  Talk with your health care provider about your options for quitting smoking.  Find out what treatment options are covered by your health insurance. WHAT STRATEGIES CAN I USE TO QUIT SMOKING?  Talk with your healthcare provider about different strategies to quit smoking. Some strategies include:  Quitting smoking altogether instead of gradually lessening how much you smoke over a period of time. Research shows that quitting "cold [place]" is more successful than gradually quitting.  Attending in-person counseling to help you build problem-solving skills. You are more likely to have success in quitting if you attend several counseling sessions. Even short sessions of 10 minutes can be effective.  Finding resources and support systems that can help you to quit smoking and remain smoke-free after you quit. These resources are most helpful when you use them often. They can include:  Online chats with a Social worker.  Telephone quitlines.  Printed Furniture conservator/restorer.  Support groups or group counseling.  Text messaging programs.  Mobile phone applications.  Taking medicines to help you quit smoking. (If you are pregnant or breastfeeding, talk with your health care provider first.) Some medicines contain nicotine and some do not. Both types of medicines help with cravings, but the medicines that include nicotine help to relieve  withdrawal symptoms. Your health care provider may recommend:  Nicotine patches, gum, or lozenges.  Nicotine inhalers or sprays.  Non-nicotine medicine that is taken by mouth. Talk with your health care provider about combining strategies, such as taking medicines while you are also receiving in-person counseling. Using these two strategies together makes you more likely to succeed in quitting than if you used either strategy on its own. If you are pregnant or breastfeeding, talk with your health care provider about finding counseling or other support strategies to quit smoking. Do not take medicine to help you quit smoking unless told to do so by your health care provider. WHAT THINGS CAN I DO TO MAKE IT EASIER TO QUIT? Quitting smoking might feel overwhelming at first, but there is a lot that you can do to make it easier. Take these important actions:  Reach out to your family and friends and ask that they support and encourage you during this time. Call telephone quitlines, reach out to support groups, or work with a counselor for support.  Ask people who smoke to avoid smoking around you.  Avoid places that trigger you to smoke, such as bars, parties, or smoke-break areas at work.  Spend time around people who do not smoke.  Lessen stress in your life, because stress can be a smoking trigger for some people. To lessen stress, try:  Exercising regularly.  Deep-breathing exercises.  Yoga.  Meditating.  Performing a body scan. This involves closing your eyes, scanning your body from head to toe, and noticing which parts of your body are particularly tense. Purposefully relax the muscles in those areas.  Download or purchase mobile phone or tablet apps (applications) that can help you stick to your quit plan by providing reminders, tips, and encouragement. There are many free apps, such as QuitGuide from the [employer] Office manager for Disease Control and Prevention). You can find other support  for quitting smoking (smoking cessation) through smokefree.gov and other websites. HOW WILL I FEEL WHEN I QUIT SMOKING? Within the first 24 hours of quitting smoking, you may start to feel some withdrawal symptoms. These symptoms are usually most noticeable 2-3 days after quitting, but they usually do not last beyond 2-3 weeks. Changes or symptoms that you might experience include:  Mood swings.  Restlessness, anxiety, or irritation.  Difficulty concentrating.  Dizziness.  Strong cravings for sugary foods in addition to nicotine.  Mild weight gain.  Constipation.  Nausea.  Coughing or a sore throat.  Changes in how your medicines work in your body.  A depressed mood.  Difficulty sleeping (insomnia). After the first 2-3 weeks of quitting, you may start to notice more positive results, such as:  Improved sense of smell and taste.  Decreased coughing and sore throat.  Slower heart rate.  Lower blood pressure.  Clearer skin.  The ability to breathe more easily.  Fewer sick days. Quitting smoking is very challenging for most people. Do not get discouraged if you are not successful the first time. Some people need to make many attempts to quit before they achieve long-term success. Do your best to stick to your quit  your quit plan, and talk with your health care provider if you have any questions or concerns.   This information is not intended to replace advice given to you by your health care provider. Make sure you discuss any questions you have with your health care provider.   Document Released: 06/05/2001 Document Revised: 10/26/2014 Document Reviewed: 10/26/2014 Elsevier Interactive Patient Education 2016 Elsevier Inc.  

## 2016-04-18 ENCOUNTER — Other Ambulatory Visit: Payer: Self-pay | Admitting: Cardiovascular Disease

## 2016-05-17 ENCOUNTER — Other Ambulatory Visit: Payer: Self-pay | Admitting: Cardiovascular Disease

## 2016-05-24 ENCOUNTER — Other Ambulatory Visit: Payer: Self-pay | Admitting: Cardiovascular Disease

## 2016-11-09 ENCOUNTER — Other Ambulatory Visit: Payer: Self-pay | Admitting: Family Medicine

## 2016-11-09 ENCOUNTER — Encounter: Payer: Self-pay | Admitting: Family Medicine

## 2016-11-09 ENCOUNTER — Ambulatory Visit (INDEPENDENT_AMBULATORY_CARE_PROVIDER_SITE_OTHER): Payer: BLUE CROSS/BLUE SHIELD | Admitting: Family Medicine

## 2016-11-09 VITALS — BP 140/70 | HR 60 | Temp 98.0°F | Resp 18 | Ht 74.0 in | Wt 196.0 lb

## 2016-11-09 DIAGNOSIS — K429 Umbilical hernia without obstruction or gangrene: Secondary | ICD-10-CM

## 2016-11-09 DIAGNOSIS — Z1211 Encounter for screening for malignant neoplasm of colon: Secondary | ICD-10-CM | POA: Diagnosis not present

## 2016-11-09 DIAGNOSIS — Z125 Encounter for screening for malignant neoplasm of prostate: Secondary | ICD-10-CM | POA: Diagnosis not present

## 2016-11-09 DIAGNOSIS — I739 Peripheral vascular disease, unspecified: Secondary | ICD-10-CM | POA: Diagnosis not present

## 2016-11-09 DIAGNOSIS — Z1159 Encounter for screening for other viral diseases: Secondary | ICD-10-CM | POA: Diagnosis not present

## 2016-11-09 NOTE — Progress Notes (Signed)
Subjective:    Patient ID: Oscar Mills, male    DOB: 23-Jul-1950, 66 y.o.   MRN: [id number]  HPI Patient requests referral for correction of umbilical hernia.  Has not been seen in more than 3 years. Overdue for colonoscopy, prostate cancer screening, hepatitis C screening. Past medical history significant for peripheral vascular disease. He is still smoking and has no desire to quit. He recently discontinued his amlodipine and Lipitor. I encouraged him to resume these. He will consent for colonoscopy as well as hepatitis C screening. He does have a large umbilical hernia approximately 4 cm in diameter. It is easily reducible and not incarcerated Past Medical History:  Diagnosis Date  . Dysfunctional alcohol use    now quit  . Hypertension   . Pancreatitis    Past Surgical History:  Procedure Laterality Date  . LOWER EXTREMITY ANGIOGRAM Bilateral 02/24/2014   Procedure: LOWER EXTREMITY ANGIOGRAM;  Surgeon: Wellington Hampshire, MD;  Location: [facility];  Service: Cardiovascular;  Laterality: Bilateral;  . PERIPHERAL VASCULAR CATHETERIZATION     Current Outpatient Prescriptions on File Prior to Visit  Medication Sig Dispense Refill  . acetaminophen (TYLENOL) 500 MG tablet Take 1,500 mg by mouth 2 (two) times daily.     Marland Kitchen amLODipine (NORVASC) 10 MG tablet TAKE 1 TABLET(10 MG) BY MOUTH DAILY 90 tablet 3  . aspirin 81 MG tablet Take 81 mg by mouth daily.    . cilostazol (PLETAL) 100 MG tablet TAKE 1 TABLET(100 MG) BY MOUTH TWICE DAILY 180 tablet 3  . pantoprazole (PROTONIX) 40 MG tablet TAKE 1 TABLET BY MOUTH DAILY 90 tablet 0  . atorvastatin (LIPITOR) 10 MG tablet TAKE 1 TABLET BY MOUTH EVERY DAY( CONTACT OFFICE TO SCHEDULE FUTURE APPOINTMENT AND REFILLS) (Patient not taking: Reported on 11/09/2016) 90 tablet 3   No current facility-administered medications on file prior to visit.    Allergies  Allergen Reactions  . Iohexol      Code: HIVES, Desc: HIVES WITH OMNIPAQUE 300 OBSERVED BY  DR Alvester Chou NO MEDS GIVEN    Social History   Social History  . Marital status: Married    Spouse name: N/A  . Number of children: 2  . Years of education: 12   Occupational History  . self-employed    Social History Main Topics  . Smoking status: Current Every Day Smoker    Packs/day: 0.25    Years: 30.00    Types: Cigarettes  . Smokeless tobacco: Never Used  . Alcohol use No  . Drug use: No  . Sexual activity: Not on file   Other Topics Concern  . Not on file   Social History Narrative  . No narrative on file      Review of Systems  All other systems reviewed and are negative.      Objective:   Physical Exam  Constitutional: He appears well-developed and well-nourished.  Neck: Neck supple. No JVD present.  Cardiovascular: Normal rate, regular rhythm and normal heart sounds.   Pulmonary/Chest: Effort normal. He has wheezes.  Abdominal: Soft. Bowel sounds are normal. He exhibits no distension. There is no tenderness. There is no rebound and no guarding.  Musculoskeletal: He exhibits no edema.  Vitals reviewed.         Assessment & Plan:  PVD (peripheral vascular disease) ([place]) - Plan: CBC with Differential/Platelet, COMPLETE METABOLIC PANEL WITH GFR  Prostate cancer screening - Plan: PSA  Encounter for hepatitis C screening test for low risk patient -  Plan: Hepatitis C Ab Reflex HCV RNA, QUANT  Colon cancer screening - Plan: Ambulatory referral to Gastroenterology  Umbilical hernia without obstruction and without gangrene - Plan: Ambulatory referral to General Surgery, CANCELED: Ambulatory referral to General Surgery  Given history of peripheral vascular disease, encourage smoking cessation compliance with aspirin and resumption of amlodipine and Lipitor. He refuses Pneumovax 23. He will consent to a PSA as well as hepatitis C screening. Refer the patient for a colonoscopy. Also refer the patient for an umbilical hernia repair with his general surgeon.  Cholesterol was checked and monitored by his cardiologist.

## 2016-11-10 LAB — CBC WITH DIFFERENTIAL/PLATELET
Basophils Absolute: 0 cells/uL (ref 0–200)
Basophils Relative: 0 %
Eosinophils Absolute: 109 cells/uL (ref 15–500)
Eosinophils Relative: 1 %
HCT: 44.1 % (ref 38.5–50.0)
Hemoglobin: 14.6 g/dL (ref 13.0–17.0)
Lymphocytes Relative: 34 %
Lymphs Abs: 3706 cells/uL (ref 850–3900)
MCH: 30.8 pg (ref 27.0–33.0)
MCHC: 33.1 g/dL (ref 32.0–36.0)
MCV: 93 fL (ref 80.0–100.0)
MPV: 10.1 fL (ref 7.5–12.5)
Monocytes Absolute: 545 cells/uL (ref 200–950)
Monocytes Relative: 5 %
Neutro Abs: 6540 cells/uL (ref 1500–7800)
Neutrophils Relative %: 60 %
Platelets: 272 10*3/uL (ref 140–400)
RBC: 4.74 MIL/uL (ref 4.20–5.80)
RDW: 13.8 % (ref 11.0–15.0)
WBC: 10.9 10*3/uL — ABNORMAL HIGH (ref 3.8–10.8)

## 2016-11-10 LAB — COMPLETE METABOLIC PANEL WITH GFR
ALT: 7 U/L — ABNORMAL LOW (ref 9–46)
AST: 9 U/L — ABNORMAL LOW (ref 10–35)
Albumin: 4.1 g/dL (ref 3.6–5.1)
Alkaline Phosphatase: 61 U/L (ref 40–115)
BUN: 12 mg/dL (ref 7–25)
CO2: 22 mmol/L (ref 20–31)
Calcium: 9.2 mg/dL (ref 8.6–10.3)
Chloride: 107 mmol/L (ref 98–110)
Creat: 0.8 mg/dL (ref 0.70–1.25)
GFR, Est African American: >89 mL/min (ref >=60)
GFR, Est Non African American: >89 mL/min (ref >=60)
Glucose, Bld: 171 mg/dL — ABNORMAL HIGH (ref 70–99)
Potassium: 3.9 mmol/L (ref 3.5–5.3)
Sodium: 140 mmol/L (ref 135–146)
Total Bilirubin: 0.3 mg/dL (ref 0.2–1.2)
Total Protein: 6.3 g/dL (ref 6.1–8.1)

## 2016-11-10 LAB — PSA: PSA: 1 ng/mL (ref <=4.0)

## 2016-11-10 LAB — HEPATITIS C ANTIBODY: HCV Ab: NEGATIVE

## 2016-11-13 ENCOUNTER — Encounter: Payer: Self-pay | Admitting: Family Medicine

## 2016-11-13 LAB — HEMOGLOBIN A1C
Hgb A1c MFr Bld: 7.4 % — ABNORMAL HIGH (ref <5.7)
Mean Plasma Glucose: 166 mg/dL

## 2016-11-20 ENCOUNTER — Ambulatory Visit (INDEPENDENT_AMBULATORY_CARE_PROVIDER_SITE_OTHER): Payer: BLUE CROSS/BLUE SHIELD | Admitting: Family Medicine

## 2016-11-20 ENCOUNTER — Encounter: Payer: Self-pay | Admitting: Family Medicine

## 2016-11-20 VITALS — BP 120/58 | HR 78 | Temp 97.8°F | Resp 18 | Ht 74.0 in | Wt 192.0 lb

## 2016-11-20 DIAGNOSIS — I70209 Unspecified atherosclerosis of native arteries of extremities, unspecified extremity: Secondary | ICD-10-CM

## 2016-11-20 DIAGNOSIS — E1151 Type 2 diabetes mellitus with diabetic peripheral angiopathy without gangrene: Secondary | ICD-10-CM | POA: Diagnosis not present

## 2016-11-20 MED ORDER — ACCU-CHEK MULTICLIX LANCETS MISC: 12 refills | Status: DC

## 2016-11-20 MED ORDER — ACCU-CHEK AVIVA PLUS W/DEVICE KIT: PACK | 0 refills | Status: DC

## 2016-11-20 MED ORDER — GLUCOSE BLOOD VI STRP: ORAL_STRIP | 12 refills | Status: DC

## 2016-11-20 NOTE — Addendum Note (Signed)
Addended by: Shary Decamp B on: 11/20/2016 03:32 PM   Modules accepted: Orders

## 2016-11-20 NOTE — Progress Notes (Signed)
Subjective:    Patient ID: Oscar Mills, male    DOB: 1951/06/25, 66 y.o.   MRN: [id number]  HPI  11/09/16 Patient requests referral for correction of umbilical hernia.  Has not been seen in more than 3 years. Overdue for colonoscopy, prostate cancer screening, hepatitis C screening. Past medical history significant for peripheral vascular disease. He is still smoking and has no desire to quit. He recently discontinued his amlodipine and Lipitor. I encouraged him to resume these. He will consent for colonoscopy as well as hepatitis C screening. He does have a large umbilical hernia approximately 4 cm in diameter. It is easily reducible and not incarcerated.  At that time, my plan was: Given history of peripheral vascular disease, encourage smoking cessation compliance with aspirin and resumption of amlodipine and Lipitor. He refuses Pneumovax 23. He will consent to a PSA as well as hepatitis C screening. Refer the patient for a colonoscopy. Also refer the patient for an umbilical hernia repair with his general surgeon. Cholesterol was checked and monitored by his cardiologist.  11/20/16 Labs are shown below and indicate diabetes mellitus type 2.  He is here today to discuss.   Orders Only on 11/09/2016  Component Date Value Ref Range Status  . Hgb A1c MFr Bld 11/09/2016 7.4* <5.7 % Final   Comment:   For someone without known diabetes, a hemoglobin A1c value of 6.5% or greater indicates that they may have diabetes and this should be confirmed with a follow-up test.   For someone with known diabetes, a value <7% indicates that their diabetes is well controlled and a value greater than or equal to 7% indicates suboptimal control. A1c targets should be individualized based on duration of diabetes, age, comorbid conditions, and other considerations.   Currently, no consensus exists for use of hemoglobin A1c for diagnosis of diabetes for children.     . Mean Plasma Glucose 11/09/2016  166  mg/dL Final  Office Visit on 11/09/2016  Component Date Value Ref Range Status  . PSA 11/09/2016 1.0  <=4.0 ng/mL Final   Comment:   The total PSA value from this assay system is standardized against the WHO standard. The test result will be approximately 20% lower when compared to the equimolar-standardized total PSA (Beckman Coulter). Comparison of serial PSA results should be interpreted with this fact in mind.   This test was performed using the Siemens chemiluminescent method. Values obtained from different assay methods cannot be used interchangeably. PSA levels, regardless of value, should not be interpreted as absolute evidence of the presence or absence of disease.     . WBC 11/09/2016 10.9* 3.8 - 10.8 K/uL Final  . RBC 11/09/2016 4.74  4.20 - 5.80 MIL/uL Final  . Hemoglobin 11/09/2016 14.6  13.0 - 17.0 g/dL Final  . HCT 11/09/2016 44.1  38.5 - 50.0 % Final  . MCV 11/09/2016 93.0  80.0 - 100.0 fL Final  . MCH 11/09/2016 30.8  27.0 - 33.0 pg Final  . MCHC 11/09/2016 33.1  32.0 - 36.0 g/dL Final  . RDW 11/09/2016 13.8  11.0 - 15.0 % Final  . Platelets 11/09/2016 272  140 - 400 K/uL Final  . MPV 11/09/2016 10.1  7.5 - 12.5 fL Final  . Neutro Abs 11/09/2016 6540  1,500 - 7,800 cells/uL Final  . Lymphs Abs 11/09/2016 3706  850 - 3,900 cells/uL Final  . Monocytes Absolute 11/09/2016 545  200 - 950 cells/uL Final  . Eosinophils Absolute 11/09/2016 109  15 -  500 cells/uL Final  . Basophils Absolute 11/09/2016 0  0 - 200 cells/uL Final  . Neutrophils Relative % 11/09/2016 60  % Final  . Lymphocytes Relative 11/09/2016 34  % Final  . Monocytes Relative 11/09/2016 5  % Final  . Eosinophils Relative 11/09/2016 1  % Final  . Basophils Relative 11/09/2016 0  % Final  . Smear Review 11/09/2016 Criteria for review not met   Final  . Sodium 11/09/2016 140  135 - 146 mmol/L Final  . Potassium 11/09/2016 3.9  3.5 - 5.3 mmol/L Final  . Chloride 11/09/2016 107  98 - 110 mmol/L Final   . CO2 11/09/2016 22  20 - 31 mmol/L Final  . Glucose, Bld 11/09/2016 171* 70 - 99 mg/dL Final  . BUN 11/09/2016 12  7 - 25 mg/dL Final  . Creat 11/09/2016 0.80  0.70 - 1.25 mg/dL Final   Comment:   For patients > or = 64 years of age: The upper reference limit for Creatinine is approximately 13% higher for people identified as African-American.     . Total Bilirubin 11/09/2016 0.3  0.2 - 1.2 mg/dL Final  . Alkaline Phosphatase 11/09/2016 61  40 - 115 U/L Final  . AST 11/09/2016 9* 10 - 35 U/L Final  . ALT 11/09/2016 7* 9 - 46 U/L Final  . Total Protein 11/09/2016 6.3  6.1 - 8.1 g/dL Final  . Albumin 11/09/2016 4.1  3.6 - 5.1 g/dL Final  . Calcium 11/09/2016 9.2  8.6 - 10.3 mg/dL Final  . GFR, Est African American 11/09/2016 >89  >=60 mL/min Final  . GFR, Est Non African American 11/09/2016 >89  >=60 mL/min Final  . HCV Ab 11/09/2016 NEGATIVE  NEGATIVE Final    Past Medical History:  Diagnosis Date  . Diabetes mellitus, type II ([place])   . Dysfunctional alcohol use    now quit  . Hypertension   . Pancreatitis    Past Surgical History:  Procedure Laterality Date  . LOWER EXTREMITY ANGIOGRAM Bilateral 02/24/2014   Procedure: LOWER EXTREMITY ANGIOGRAM;  Surgeon: Wellington Hampshire, MD;  Location: [facility];  Service: Cardiovascular;  Laterality: Bilateral;  . PERIPHERAL VASCULAR CATHETERIZATION     Current Outpatient Prescriptions on File Prior to Visit  Medication Sig Dispense Refill  . acetaminophen (TYLENOL) 500 MG tablet Take 1,500 mg by mouth 2 (two) times daily.     Marland Kitchen amLODipine (NORVASC) 10 MG tablet TAKE 1 TABLET(10 MG) BY MOUTH DAILY 90 tablet 3  . aspirin 81 MG tablet Take 81 mg by mouth daily.    Marland Kitchen atorvastatin (LIPITOR) 10 MG tablet TAKE 1 TABLET BY MOUTH EVERY DAY( CONTACT OFFICE TO SCHEDULE FUTURE APPOINTMENT AND REFILLS) 90 tablet 3  . cilostazol (PLETAL) 100 MG tablet TAKE 1 TABLET(100 MG) BY MOUTH TWICE DAILY 180 tablet 3  . pantoprazole (PROTONIX) 40 MG tablet  TAKE 1 TABLET BY MOUTH DAILY 90 tablet 0   No current facility-administered medications on file prior to visit.    Allergies  Allergen Reactions  . Iohexol      Code: HIVES, Desc: HIVES WITH OMNIPAQUE 300 OBSERVED BY DR Alvester Chou NO MEDS GIVEN    Social History   Social History  . Marital status: Married    Spouse name: N/A  . Number of children: 2  . Years of education: 12   Occupational History  . self-employed    Social History Main Topics  . Smoking status: Current Every Day Smoker    Packs/day:  0.25    Years: 30.00    Types: Cigarettes  . Smokeless tobacco: Never Used  . Alcohol use No  . Drug use: No  . Sexual activity: Not on file   Other Topics Concern  . Not on file   Social History Narrative  . No narrative on file      Review of Systems  All other systems reviewed and are negative.      Objective:   Physical Exam  Constitutional: He appears well-developed and well-nourished.  Neck: Neck supple. No JVD present.  Cardiovascular: Normal rate, regular rhythm and normal heart sounds.   Pulmonary/Chest: Effort normal. He has wheezes.  Abdominal: Soft. Bowel sounds are normal. He exhibits no distension. There is no tenderness. There is no rebound and no guarding.  Musculoskeletal: He exhibits no edema.  Vitals reviewed.         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 Recommended 45 g of carbs or less per meal. Recommended gradual resumption of exercise up to 30 minutes a day 5 days a week. Recommended smoking cessation. Offered referral to a nutritionist but was declined. Patient would like to try aggressive lifestyle changes and then recheck fasting lab work in 4 months.

## 2016-11-20 NOTE — Addendum Note (Signed)
Addended by: Shary Decamp B on: 11/20/2016 03:48 PM   Modules accepted: Orders

## 2017-01-30 ENCOUNTER — Telehealth: Payer: Self-pay | Admitting: Family Medicine

## 2017-01-30 NOTE — Telephone Encounter (Signed)
Referral to doctor Francisco Capuchin at [facility]? Does anyone know what this is about??

## 2017-01-30 NOTE — Telephone Encounter (Signed)
Umbilical hernia

## 2017-01-31 NOTE — Telephone Encounter (Signed)
Called and spoke to wife and they had questions about referral - given information and informed to call back if any problems.

## 2017-02-27 ENCOUNTER — Encounter
Admission: RE | Admit: 2017-02-27 | Discharge: 2017-02-27 | Disposition: A | Discharge status: Home / Self Care | Payer: BLUE CROSS/BLUE SHIELD | Source: Ambulatory Visit | Attending: Surgery | Admitting: Surgery

## 2017-02-27 HISTORY — DX: Gastro-esophageal reflux disease without esophagitis: K21.9

## 2017-02-27 HISTORY — DX: Unspecified osteoarthritis, unspecified site: M19.90

## 2017-02-27 HISTORY — DX: Peripheral vascular disease, unspecified: I73.9

## 2017-02-27 NOTE — Patient Instructions (Addendum)
Your procedure is scheduled on: 03-05-17 TUESDAY Report to Same Day Surgery 2nd floor medical mall [facility] Entrance-take elevator on left to 2nd floor.  Check in with surgery information desk.) To find out your arrival time please call [phone] between 1PM - 3PM on 03-04-17 MONDAY  Remember: Instructions that are not followed completely may result in serious medical risk, up to and including death, or upon the discretion of your surgeon and anesthesiologist your surgery may need to be rescheduled.    _x___ 1. Do not eat food after midnight the night before your procedure. NO GUM CHEWING OR HARD CANDIES.  You may drink clear liquids up to 2 hours before you are scheduled to arrive at the hospital for your procedure.  Do not drink clear liquids within 2 hours of your scheduled arrival to the hospital.  Clear liquids include  --Water or Apple juice without pulp  --Clear carbohydrate beverage such as ClearFast or Gatorade  --Black Coffee or Clear Tea (No milk, no creamers, do not add anything to the coffee or Tea)  Type 1 and type 2 diabetics should only drink water.     __x__ 2. No Alcohol for 24 hours before or after surgery.   __x__3. No Smoking for 24 prior to surgery.   ____  4. Bring all medications with you on the day of surgery if instructed.    __x__ 5. Notify your doctor if there is any change in your medical condition     (cold, fever, infections).     Do not wear jewelry, make-up, hairpins, clips or nail polish.  Do not wear lotions, powders, or perfumes. You may wear deodorant.  Do not shave 48 hours prior to surgery. Men may shave face and neck.  Do not bring valuables to the hospital.    [facility] is not responsible for any belongings or valuables.               Contacts, dentures or bridgework may not be worn into surgery.  Leave your suitcase in the car. After surgery it may be brought to your room.  For patients admitted to the hospital, discharge time is  determined by your treatment team.   Patients discharged the day of surgery will not be allowed to drive home.  You will need someone to drive you home and stay with you the night of your procedure.    Please read over the following fact sheets that you were given:    _x___ [place] WITH A SMALL SIP OF WATER. These include:  1. AMLODIPINE (NORVASC)  2. ATORVASTATIN (LIPITOR)  3. PROTONIX (PANTOPRAZOLE)  4. TAKE AN EXTRA PROTONIX Monday NIGHT BEFORE BED (03-04-17)  5.  6.  ____Fleets enema or Magnesium Citrate as directed.   _x___ Use CHG Soap or sage wipes as directed on instruction sheet   ____ Use inhalers on the day of surgery and bring to hospital day of surgery  ____ Stop Metformin and Janumet 2 days prior to surgery.    ____ Take 1/2 of usual insulin dose the night before surgery and none on the morning surgery.   _x___ Follow recommendations from Cardiologist, Pulmonologist or PCP regarding stopping Aspirin, Coumadin, Plavix ,Eliquis, Effient, or Pradaxa, and Pletal-OK TO CONTINUE PLETAL PER DR SMITHS OFFICE-DO NOT TAKE PLETAL AM OF SURGERY.  X____Stop Anti-inflammatories such as Advil, Aleve, Ibuprofen, Motrin, Naproxen, Naprosyn, Goodies powders or aspirin products NOW-OK to take Tylenol    ____  Stop supplements until after surgery.     ____ Bring C-Pap to the hospital.

## 2017-02-28 ENCOUNTER — Ambulatory Visit
Admission: RE | Admit: 2017-02-28 | Discharge: 2017-02-28 | Disposition: A | Discharge status: Home / Self Care | Payer: BLUE CROSS/BLUE SHIELD | Source: Ambulatory Visit | Attending: Surgery | Admitting: Surgery

## 2017-02-28 DIAGNOSIS — I10 Essential (primary) hypertension: Secondary | ICD-10-CM | POA: Diagnosis not present

## 2017-02-28 DIAGNOSIS — R001 Bradycardia, unspecified: Secondary | ICD-10-CM | POA: Diagnosis not present

## 2017-02-28 DIAGNOSIS — Z01810 Encounter for preprocedural cardiovascular examination: Secondary | ICD-10-CM | POA: Diagnosis present

## 2017-03-04 MED ORDER — CEFAZOLIN SODIUM-DEXTROSE 2-4 GM/100ML-% IV SOLN
2.0000 g | Freq: Once | INTRAVENOUS | Status: AC
  Administered 2017-03-05: 2 g via INTRAVENOUS

## 2017-03-04 NOTE — Pre-Procedure Instructions (Addendum)
Spoke with Judeen Hammans RN at Dr Darliss Ridgel office on 02-27-17 regarding Pletal and if pt needs to stop this prior to surgery- Judeen Hammans said that he does not need to stop it.  I told her I would instruct pt to take up until the day prior to surgery and do not take Pletal am of surgery.

## 2017-03-05 ENCOUNTER — Encounter: Payer: Self-pay | Admitting: Anesthesiology

## 2017-03-05 ENCOUNTER — Ambulatory Visit: Payer: BLUE CROSS/BLUE SHIELD | Admitting: Anesthesiology

## 2017-03-05 ENCOUNTER — Encounter
Admission: RE | Disposition: A | Discharge status: Home / Self Care | Payer: Self-pay | Source: Ambulatory Visit | Attending: Surgery

## 2017-03-05 ENCOUNTER — Ambulatory Visit
Admission: RE | Admit: 2017-03-05 | Discharge: 2017-03-05 | Disposition: A | Discharge status: Home / Self Care | Payer: BLUE CROSS/BLUE SHIELD | Source: Ambulatory Visit | Attending: Surgery | Admitting: Surgery

## 2017-03-05 DIAGNOSIS — F17210 Nicotine dependence, cigarettes, uncomplicated: Secondary | ICD-10-CM | POA: Insufficient documentation

## 2017-03-05 DIAGNOSIS — K219 Gastro-esophageal reflux disease without esophagitis: Secondary | ICD-10-CM | POA: Insufficient documentation

## 2017-03-05 DIAGNOSIS — E1151 Type 2 diabetes mellitus with diabetic peripheral angiopathy without gangrene: Secondary | ICD-10-CM | POA: Diagnosis not present

## 2017-03-05 DIAGNOSIS — I10 Essential (primary) hypertension: Secondary | ICD-10-CM | POA: Insufficient documentation

## 2017-03-05 DIAGNOSIS — K429 Umbilical hernia without obstruction or gangrene: Secondary | ICD-10-CM | POA: Diagnosis present

## 2017-03-05 DIAGNOSIS — Z79899 Other long term (current) drug therapy: Secondary | ICD-10-CM | POA: Insufficient documentation

## 2017-03-05 HISTORY — PX: UMBILICAL HERNIA REPAIR: SHX196

## 2017-03-05 LAB — GLUCOSE, CAPILLARY: Glucose-Capillary: 94 mg/dL (ref 65–99)

## 2017-03-05 SURGERY — REPAIR, HERNIA, UMBILICAL, ADULT
Anesthesia: General | Wound class: Clean

## 2017-03-05 MED ORDER — ACETAMINOPHEN 500 MG PO TABS: 1000.0000 mg | ORAL_TABLET | Freq: Two times a day (BID) | ORAL | 0 refills | Status: DC

## 2017-03-05 MED ORDER — SUGAMMADEX SODIUM 200 MG/2ML IV SOLN
INTRAVENOUS | Status: DC | PRN
  Administered 2017-03-05: 200 mg via INTRAVENOUS

## 2017-03-05 MED ORDER — OXYCODONE HCL 5 MG/5ML PO SOLN: 5.0000 mg | Freq: Once | ORAL | Status: DC | PRN

## 2017-03-05 MED ORDER — ONDANSETRON HCL 4 MG/2ML IJ SOLN
INTRAMUSCULAR | Status: AC
  Filled 2017-03-05: qty 2

## 2017-03-05 MED ORDER — SUGAMMADEX SODIUM 200 MG/2ML IV SOLN
INTRAVENOUS | Status: AC
  Filled 2017-03-05: qty 2

## 2017-03-05 MED ORDER — ACETAMINOPHEN 10 MG/ML IV SOLN
INTRAVENOUS | Status: DC | PRN
Start: 2017-03-05 — End: 2017-03-05
  Administered 2017-03-05: 1000 mg via INTRAVENOUS

## 2017-03-05 MED ORDER — FENTANYL CITRATE (PF) 100 MCG/2ML IJ SOLN
INTRAMUSCULAR | Status: AC
  Filled 2017-03-05: qty 2

## 2017-03-05 MED ORDER — CEFAZOLIN SODIUM-DEXTROSE 2-4 GM/100ML-% IV SOLN
INTRAVENOUS | Status: AC
  Filled 2017-03-05: qty 100

## 2017-03-05 MED ORDER — ROCURONIUM BROMIDE 100 MG/10ML IV SOLN
INTRAVENOUS | Status: DC | PRN
  Administered 2017-03-05: 50 mg via INTRAVENOUS

## 2017-03-05 MED ORDER — DEXAMETHASONE SODIUM PHOSPHATE 10 MG/ML IJ SOLN
INTRAMUSCULAR | Status: AC
  Filled 2017-03-05: qty 1

## 2017-03-05 MED ORDER — ACETAMINOPHEN 10 MG/ML IV SOLN
INTRAVENOUS | Status: AC
  Filled 2017-03-05: qty 100

## 2017-03-05 MED ORDER — SUCCINYLCHOLINE CHLORIDE 20 MG/ML IJ SOLN
INTRAMUSCULAR | Status: AC
  Filled 2017-03-05: qty 1

## 2017-03-05 MED ORDER — LIDOCAINE HCL (CARDIAC) 20 MG/ML IV SOLN
INTRAVENOUS | Status: DC | PRN
  Administered 2017-03-05: 100 mg via INTRAVENOUS

## 2017-03-05 MED ORDER — ONDANSETRON HCL 4 MG/2ML IJ SOLN
INTRAMUSCULAR | Status: DC | PRN
  Administered 2017-03-05: 4 mg via INTRAVENOUS

## 2017-03-05 MED ORDER — SODIUM CHLORIDE 0.9 % IV SOLN
INTRAVENOUS | Status: DC
Start: 2017-03-05 — End: 2017-03-05
  Administered 2017-03-05: 13:00:00 via INTRAVENOUS

## 2017-03-05 MED ORDER — DEXAMETHASONE SODIUM PHOSPHATE 10 MG/ML IJ SOLN
INTRAMUSCULAR | Status: DC | PRN
Start: 2017-03-05 — End: 2017-03-05
  Administered 2017-03-05: 10 mg via INTRAVENOUS

## 2017-03-05 MED ORDER — HYDROCODONE-ACETAMINOPHEN 5-325 MG PO TABS
ORAL_TABLET | ORAL | Status: AC
  Filled 2017-03-05: qty 1

## 2017-03-05 MED ORDER — FENTANYL CITRATE (PF) 100 MCG/2ML IJ SOLN
25.0000 ug | INTRAMUSCULAR | Status: DC | PRN
  Administered 2017-03-05: 50 ug via INTRAVENOUS

## 2017-03-05 MED ORDER — ROCURONIUM BROMIDE 50 MG/5ML IV SOLN
INTRAVENOUS | Status: AC
  Filled 2017-03-05: qty 2

## 2017-03-05 MED ORDER — PROPOFOL 10 MG/ML IV BOLUS
INTRAVENOUS | Status: DC | PRN
  Administered 2017-03-05: 50 mg via INTRAVENOUS
  Administered 2017-03-05: 150 mg via INTRAVENOUS

## 2017-03-05 MED ORDER — FENTANYL CITRATE (PF) 100 MCG/2ML IJ SOLN
INTRAMUSCULAR | Status: AC
  Administered 2017-03-05: 50 ug via INTRAVENOUS
  Filled 2017-03-05: qty 2

## 2017-03-05 MED ORDER — BUPIVACAINE-EPINEPHRINE (PF) 0.5% -1:200000 IJ SOLN
INTRAMUSCULAR | Status: AC
  Filled 2017-03-05: qty 30

## 2017-03-05 MED ORDER — LIDOCAINE HCL (PF) 2 % IJ SOLN
INTRAMUSCULAR | Status: AC
Start: 2017-03-05 — End: 2017-03-05
  Filled 2017-03-05: qty 4

## 2017-03-05 MED ORDER — EPHEDRINE SULFATE 50 MG/ML IJ SOLN
INTRAMUSCULAR | Status: DC | PRN
  Administered 2017-03-05 (×2): 7.5 mg via INTRAVENOUS
  Administered 2017-03-05: 10 mg via INTRAVENOUS

## 2017-03-05 MED ORDER — LACTATED RINGERS IV SOLN
INTRAVENOUS | Status: DC | PRN
  Administered 2017-03-05: 13:00:00 via INTRAVENOUS

## 2017-03-05 MED ORDER — FENTANYL CITRATE (PF) 100 MCG/2ML IJ SOLN
INTRAMUSCULAR | Status: DC | PRN
  Administered 2017-03-05 (×2): 50 ug via INTRAVENOUS

## 2017-03-05 MED ORDER — OXYCODONE HCL 5 MG PO TABS: 5.0000 mg | ORAL_TABLET | Freq: Once | ORAL | Status: DC | PRN

## 2017-03-05 MED ORDER — SUCCINYLCHOLINE CHLORIDE 20 MG/ML IJ SOLN
INTRAMUSCULAR | Status: DC | PRN
  Administered 2017-03-05: 100 mg via INTRAVENOUS

## 2017-03-05 MED ORDER — BUPIVACAINE-EPINEPHRINE (PF) 0.5% -1:200000 IJ SOLN
INTRAMUSCULAR | Status: DC | PRN
  Administered 2017-03-05: 8 mL via PERINEURAL

## 2017-03-05 MED ORDER — HYDROCODONE-ACETAMINOPHEN 5-325 MG PO TABS
1.0000 | ORAL_TABLET | ORAL | Status: DC | PRN
  Administered 2017-03-05: 1 via ORAL

## 2017-03-05 MED ORDER — HYDROCODONE-ACETAMINOPHEN 5-325 MG PO TABS: 1.0000 | ORAL_TABLET | ORAL | 0 refills | Status: DC | PRN

## 2017-03-05 SURGICAL SUPPLY — 25 items
BLADE SURG 15 STRL LF DISP TIS (BLADE) ×1 IMPLANT
BLADE SURG 15 STRL SS (BLADE) ×1
CANISTER SUCT 1200ML W/VALVE (MISCELLANEOUS) ×2 IMPLANT
CHLORAPREP W/TINT 26ML (MISCELLANEOUS) ×2 IMPLANT
DERMABOND ADVANCED (GAUZE/BANDAGES/DRESSINGS) ×1
DERMABOND ADVANCED .7 DNX12 (GAUZE/BANDAGES/DRESSINGS) ×1 IMPLANT
DRAPE LAPAROTOMY 77X122 PED (DRAPES) ×2 IMPLANT
ELECT REM PT RETURN 9FT ADLT (ELECTROSURGICAL) ×2
ELECTRODE REM PT RTRN 9FT ADLT (ELECTROSURGICAL) ×1 IMPLANT
GLOVE BIO SURGEON STRL SZ7.5 (GLOVE) ×10 IMPLANT
GOWN STRL REUS W/ TWL LRG LVL3 (GOWN DISPOSABLE) ×3 IMPLANT
GOWN STRL REUS W/TWL LRG LVL3 (GOWN DISPOSABLE) ×3
KIT RM TURNOVER STRD PROC AR (KITS) ×2 IMPLANT
LABEL OR SOLS (LABEL) ×2 IMPLANT
MESH SYNTHETIC 4X6 SOFT BARD (Mesh General) ×1 IMPLANT
MESH SYNTHETIC SOFT BARD 4X6 (Mesh General) ×1 IMPLANT
NEEDLE HYPO 25X1 1.5 SAFETY (NEEDLE) ×2 IMPLANT
NS IRRIG 500ML POUR BTL (IV SOLUTION) ×2 IMPLANT
PACK BASIN MINOR ARMC (MISCELLANEOUS) ×2 IMPLANT
SUT CHROMIC 3 0 SH 27 (SUTURE) ×2 IMPLANT
SUT CHROMIC 4 0 RB 1X27 (SUTURE) ×2 IMPLANT
SUT MNCRL+ 5-0 UNDYED PC-3 (SUTURE) ×1 IMPLANT
SUT MONOCRYL 5-0 (SUTURE) ×1
SUT SURGILON 0 30 BLK (SUTURE) ×2 IMPLANT
SYRINGE 10CC LL (SYRINGE) ×2 IMPLANT

## 2017-03-05 NOTE — Anesthesia Post-op Follow-up Note (Signed)
Anesthesia QCDR form completed.        

## 2017-03-05 NOTE — Discharge Instructions (Addendum)
Take Tylenol or Norco if needed for pain.  Should not drive or do anything dangerous when taking Norco.  May shower and blot dry.  Avoid straining and heavy lifting.  AMBULATORY SURGERY  DISCHARGE INSTRUCTIONS   1) The drugs that you were given will stay in your system until tomorrow so for the next 24 hours you should not:  A) Drive an automobile B) Make any legal decisions C) Drink any alcoholic beverage   2) You may resume regular meals tomorrow.  Today it is better to start with liquids and gradually work up to solid foods.  You may eat anything you prefer, but it is better to start with liquids, then soup and crackers, and gradually work up to solid foods.   3) Please notify your doctor immediately if you have any unusual bleeding, trouble breathing, redness and pain at the surgery site, drainage, fever, or pain not relieved by medication.    4) Additional Instructions: TAKE A STOOL SOFTENER TWICE A DAY WHILE TAKING NARCOTIC PAIN MEDICINE TO PREVENT CONSTIPATION   Please contact your physician with any problems or Same Day Surgery at [phone], Monday through Friday 6 am to 4 pm, or [place] at [facility] number at [phone].

## 2017-03-05 NOTE — H&P (Signed)
  He comes in today prepared for an umbilical hernia repair. He reports no change in overall condition since the day of the office exam.  The umbilical hernia was demonstrated  I discussed the plan for surgery

## 2017-03-05 NOTE — Transfer of Care (Signed)
Immediate Anesthesia Transfer of Care Note  Patient: TILTON MARSALIS  Procedure(s) Performed: Procedure(s): HERNIA REPAIR UMBILICAL ADULT (N/A)  Patient Location: PACU  Anesthesia Type:General  Level of Consciousness: awake, alert  and oriented  Airway & Oxygen Therapy: Patient connected to face mask oxygen  Post-op Assessment: Post -op Vital signs reviewed and stable  Post vital signs: stable  Last Vitals:  Vitals:   03/05/17 1230 03/05/17 1417  BP: (!) 121/102 (!) 149/65  Pulse: 63 74  Resp: 16 17  Temp: 36.7 C (!) 36.3 C  SpO2: 100% 100%    Last Pain:  Vitals:   03/05/17 1417  TempSrc: Temporal         Complications: No apparent anesthesia complications

## 2017-03-05 NOTE — Op Note (Signed)
OPERATIVE REPORT  PREOPERATIVE  DIAGNOSIS: Umbilical hernia  POSTOPERATIVE DIAGNOSIS: Umbilical hernia  PROCEDURE: Umbilical hernia repair  ANESTHESIA: General  SURGEON: Rochel Brome M.D.  INDICATIONS:  He reports a long history of umbilical hernia is now ready to have it repaired.  A hernia was demonstrated on physical exam and repair was recommended.  The patient was placed on the operating table in the supine position under general anesthesia. The abdomen was prepared with ChloraPrep and draped in a sterile manner. A transversely orientedsupraumbilicalcurvilinear incision was made and carried down through subcutaneous tissues.bleeding points were controlled with electrocautery. There was an umbilical hernia sac which was dissected free from surrounding tissues. The sac was inverted back into the abdominal cavity. Properitoneal fat was dissected away from the fascia circumferentially. Bard soft mesh was cut to create an oval shape of2 x 3 cm. It was placed into the properitoneal plane oriented transversely and sutured to the overlying fascia with through and through 0 Surgilon sutures.  The fascial defect was closed with a transversely oriented suture line of interrupted 0 Surgilon figure-of-eight sutures incorporating each suture into the mesh. The skin of the umbilicus was sutured to the subcutaneous tissues with 3-0 chromic suture. The subcutaneous tissues were infiltrated with half percent Sensorcaine with epinephrine. The skin was closed with running 5-0 Monocryl subcuticular suture and Dermabond  The patient appeared to be in satisfactory condition and was prepared for transfer to the recovery room  [employer].D.

## 2017-03-05 NOTE — Anesthesia Procedure Notes (Signed)
Procedure Name: Intubation Date/Time: 03/05/2017 1:11 PM Performed by: Tamala Julian, Chidera Dearcos Pre-anesthesia Checklist: Patient identified, Emergency Drugs available, Suction available and Patient being monitored Patient Re-evaluated:Patient Re-evaluated prior to induction Oxygen Delivery Method: Circle system utilized Preoxygenation: Pre-oxygenation with 100% oxygen Induction Type: IV induction and Cricoid Pressure applied Ventilation: Mask ventilation without difficulty Laryngoscope Size: 3 and McGraph Grade View: Grade I Tube type: Oral Tube size: 7.5 mm Number of attempts: 2 (1st attempt - could not pass ETT past VC - grade 2b view w/ cricoid pressure; elected to use McGrath videoscope for 2nd attempt) Airway Equipment and Method: Stylet Placement Confirmation: ETT inserted through vocal cords under direct vision,  positive ETCO2 and breath sounds checked- equal and bilateral Secured at: 22 cm Tube secured with: Tape Dental Injury: Teeth and Oropharynx as per pre-operative assessment

## 2017-03-05 NOTE — Anesthesia Preprocedure Evaluation (Signed)
Anesthesia Evaluation  Patient identified by MRN, date of birth, ID band Patient awake    Reviewed: Allergy & Precautions, H&P , NPO status , Patient's Chart, lab work & pertinent test results  History of Anesthesia Complications Negative for: history of anesthetic complications  Airway Mallampati: III  TM Distance: <3 FB Neck ROM: limited    Dental  (+) Poor Dentition, Chipped   Pulmonary neg shortness of breath, Current Smoker,           Cardiovascular Exercise Tolerance: Good hypertension, (-) angina+ Peripheral Vascular Disease  (-) Past MI and (-) PND      Neuro/Psych PSYCHIATRIC DISORDERS negative neurological ROS     GI/Hepatic Neg liver ROS, GERD  Medicated and Controlled,  Endo/Other  diabetes, Type 2  Renal/GU      Musculoskeletal  (+) Arthritis ,   Abdominal   Peds  Hematology negative hematology ROS (+)   Anesthesia Other Findings Past Medical History: No date: Arthritis     Comment:  NECK No date: Diabetes mellitus, type II (HCC)     Comment:  DIET CONTROLLED-NO MEDS No date: Dysfunctional alcohol use     Comment:  now quit No date: GERD (gastroesophageal reflux disease) No date: Hypertension No date: Pancreatitis No date: Peripheral vascular disease ([place])  Past Surgical History: 02/24/2014: LOWER EXTREMITY ANGIOGRAM; Bilateral     Comment:  Procedure: LOWER EXTREMITY ANGIOGRAM;  Surgeon: Wellington Hampshire, MD;  Location: [facility];  Service:               Cardiovascular;  Laterality: Bilateral; No date: PERIPHERAL VASCULAR CATHETERIZATION  BMI    Body Mass Index:  23.11 kg/m      Reproductive/Obstetrics negative OB ROS                             Anesthesia Physical Anesthesia Plan  ASA: III  Anesthesia Plan: General ETT   Post-op Pain Management:    Induction: Intravenous  PONV Risk Score and Plan:   Airway Management Planned: Oral  ETT  Additional Equipment:   Intra-op Plan:   Post-operative Plan: Extubation in OR  Informed Consent: I have reviewed the patients History and Physical, chart, labs and discussed the procedure including the risks, benefits and alternatives for the proposed anesthesia with the patient or authorized representative who has indicated his/her understanding and acceptance.   Dental Advisory Given  Plan Discussed with: Anesthesiologist, CRNA and Surgeon  Anesthesia Plan Comments: (Patient consented for risks of anesthesia including but not limited to:  - adverse reactions to medications - damage to teeth, lips or other oral mucosa - sore throat or hoarseness - Damage to heart, brain, lungs or loss of life  Patient voiced understanding.)        Anesthesia Quick Evaluation

## 2017-03-06 ENCOUNTER — Encounter: Payer: Self-pay | Admitting: Surgery

## 2017-03-06 NOTE — Anesthesia Postprocedure Evaluation (Signed)
Anesthesia Post Note  [name]  Procedure(s) Performed: Procedure(s) (LRB): HERNIA REPAIR UMBILICAL ADULT (N/A)  Patient location during evaluation: PACU Anesthesia Type: General Level of consciousness: awake and alert Pain management: pain level controlled Vital Signs Assessment: post-procedure vital signs reviewed and stable Respiratory status: spontaneous breathing, nonlabored ventilation, respiratory function stable and patient connected to nasal cannula oxygen Cardiovascular status: blood pressure returned to baseline and stable Postop Assessment: no signs of nausea or vomiting Anesthetic complications: no     Last Vitals:  Vitals:   03/05/17 1531 03/05/17 1601  BP: 126/62 (!) 128/59  Pulse: 62 64  Resp:    Temp:    SpO2: 96% 99%    Last Pain:  Vitals:   03/05/17 1531  TempSrc:   PainSc: 6                  Precious Haws Piscit[place]

## 2017-04-15 ENCOUNTER — Other Ambulatory Visit: Payer: Self-pay

## 2017-04-15 ENCOUNTER — Telehealth: Payer: Self-pay | Admitting: Cardiovascular Disease

## 2017-04-15 DIAGNOSIS — I739 Peripheral vascular disease, unspecified: Secondary | ICD-10-CM

## 2017-04-15 NOTE — Telephone Encounter (Signed)
Patient wife says she is going out for a while and to leave msg if no answer.

## 2017-04-15 NOTE — Telephone Encounter (Signed)
S/w pt's wife of Dr. Tyrell Antonio recommendations. Wife now states they remember pt lifted something heavy about a month ago and thinks the foot issue could be related, stating he may have hurt his back. Advised again of MD recommendations.  Wife verbalized understanding but states they will try something for his back before making [facility] appt. Advised to call when ready to schedule.

## 2017-04-15 NOTE — Telephone Encounter (Signed)
Schedule him for lower extremity arterial Doppler this week.

## 2017-04-15 NOTE — Telephone Encounter (Signed)
S/w pt's wife Lattie Haw, per release. She reports top of pt's foot (she thinks right foot) has been numb and painful at bedtime for one week.  They have not checked pedal pulses but she said neither foot is discolored, cool to the touch or swollen. No recent foot injury.   He is on his feet, standing on concrete at work most of the day but not symptomatic during the day.  Sx only at night; unrelieved by tylenol. Per wife, pt continues to smoke cigarettes. I reviewed importance of smoking cessation He has known PAD, moderate right calf claudication d/t known SFA and popliteal artery occlusions.  He takes pletal 100mg  BID; past due for 1 year f/u. Scheduled December. Will look for sooner appt and added to waitlist. Will route to Dr. Fletcher Anon for any further recommendations.

## 2017-04-15 NOTE — Telephone Encounter (Signed)
Pt wife states top of pt foot is hurting. States this has been going on for a while States he is not having any swelling. States the pain goes up the back of his leg. Please call.

## 2017-05-06 ENCOUNTER — Encounter: Payer: Self-pay | Admitting: Physician Assistant

## 2017-05-06 ENCOUNTER — Ambulatory Visit (INDEPENDENT_AMBULATORY_CARE_PROVIDER_SITE_OTHER): Payer: BLUE CROSS/BLUE SHIELD | Admitting: Physician Assistant

## 2017-05-06 ENCOUNTER — Other Ambulatory Visit: Payer: Self-pay

## 2017-05-06 VITALS — BP 120/62 | HR 71 | Temp 97.8°F | Resp 16 | Ht 74.0 in | Wt 189.2 lb

## 2017-05-06 DIAGNOSIS — I739 Peripheral vascular disease, unspecified: Secondary | ICD-10-CM

## 2017-05-06 MED ORDER — TRAMADOL HCL 50 MG PO TABS: ORAL_TABLET | ORAL | 0 refills | Status: DC

## 2017-05-06 NOTE — Progress Notes (Signed)
Patient ID: Oscar Mills MRN: [id number], DOB: [birth year], 66 y.o. Date of Encounter: 05/06/2017, 2:43 PM    Chief Complaint:  Chief Complaint  Patient presents with  . right foot pain     HPI: 66 y.o. year old male presents with above.  Wife accompanies him for visit. They report that he "has a blockage in his right leg ". He reports that "for a long time it has been that if he walks up an incline then he will have to stop and rest because otherwise he develops a tight discomfort in right calf."  Reports now, for the past 3 weeks, he has been feeling discomfort in his right foot.  Says that he mostly notices this when he lays down to try to sleep.  During the day he does feel some discomfort there but it is not as bad.  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  Has tried applying Biofreeze and other topical treatments and they report that this helps for a few minutes but then the symptoms return.  Says that she has tried applying ice pack.  Says that he went to [facility] this past [facility] and they prescribed gabapentin 300 mg 1 nightly and he is taking this each night but it is not helped at all.  They report that they called Dr. Tyrell Antonio office (his vascular doctor) and did schedule appointment with him but that is not until December 17.  States that Dr. Tyrell Antonio office "recommended he see his PCP to get some pain meds to use until can see them."  Patient reports that he has had no known trauma or injury to the foot.  Does not think he has hit the foot on anything and does not think he is dropped anything on the foot.     Home Meds:   Outpatient Medications Prior to Visit  Medication Sig Dispense Refill  . acetaminophen (TYLENOL) 500 MG tablet Take 2 tablets (1,000 mg total) by mouth 2 (two) times daily. 30 tablet 0  . amLODipine (NORVASC) 10 MG tablet TAKE 1 TABLET(10 MG) BY MOUTH DAILY (Patient taking differently: TAKE 1 TABLET(10 MG) BY MOUTH DAILY-AM) 90  tablet 3  . aspirin 81 MG tablet Take 81 mg by mouth daily.    Marland Kitchen atorvastatin (LIPITOR) 10 MG tablet TAKE 1 TABLET BY MOUTH EVERY DAY( CONTACT OFFICE TO SCHEDULE FUTURE APPOINTMENT AND REFILLS) (Patient taking differently: TAKE 1 TABLET BY MOUTH EVERY DAY( CONTACT OFFICE TO SCHEDULE FUTURE APPOINTMENT AND REFILLS)-AM) 90 tablet 3  . Blood Glucose Monitoring Suppl (ACCU-CHEK AVIVA PLUS) w/Device KIT Check BS bid 1 kit 0  . cilostazol (PLETAL) 100 MG tablet TAKE 1 TABLET(100 MG) BY MOUTH TWICE DAILY 180 tablet 3  . gabapentin (NEURONTIN) 300 MG capsule Take 300 mg daily by mouth. Take nightly  0  . glucose blood (ACCU-CHEK AVIVA PLUS) test strip Check BS BID - new onset dm E11.9 100 each 12  . Lancets (ACCU-CHEK MULTICLIX) lancets Check BS bid - New onset DM E11.9 100 each 12  . pantoprazole (PROTONIX) 40 MG tablet Take 40 mg by mouth every other day.    Marland Kitchen HYDROcodone-acetaminophen (NORCO) 5-325 MG tablet Take 1-2 tablets by mouth every 4 (four) hours as needed for moderate pain. 12 tablet 0   No facility-administered medications prior to visit.     Allergies:  Allergies  Allergen Reactions  . Iohexol      Code: HIVES, Desc: HIVES WITH OMNIPAQUE 300 OBSERVED BY DR Alvester Chou  NO MEDS GIVEN       Review of Systems: See HPI for pertinent ROS. All other ROS negative.    Physical Exam: Blood pressure 120/62, pulse 71, temperature 97.8 F (36.6 C), temperature source Oral, resp. rate 16, height 6' 2"  (1.88 m), weight 85.8 kg (189 lb 3.2 oz), SpO2 98 %., Body mass index is 24.29 kg/m. General:  WNWD WM. Appears in no acute distress. Neck: Supple. No thyromegaly. No lymphadenopathy. Lungs: Clear bilaterally to auscultation without wheezes, rales, or rhonchi. Breathing is unlabored. Heart: Regular rhythm. No murmurs, rubs, or gallops. Msk:  Strength and tone normal for age. Extremities/Skin:  Inspection of right foot appears normal.   There is no open wound, no ulcerative area. Also there is no  ecchymosis or signs of abrasion or contusion. No palpable dorsalis pedis or posterior tibial pulse. Feet and toes are warm.  Feet and toes are normal coloration with no cyanosis. Neuro: Alert and oriented X 3. Moves all extremities spontaneously. Gait is normal. CNII-XII grossly in tact. Psych:  Responds to questions appropriately with a normal affect.     ASSESSMENT AND PLAN:  66 y.o. year old male with  1. PAD (peripheral artery disease) ([place]) I reviewed prior notes by Dr. Fletcher Anon. Does have significant peripheral arterial disease in the right lower extremity. Also I reviewed that he does have an appointment scheduled to see Dr. Fletcher Anon December 17th at 3:00. I discussed that he definitely needs to keep that appointment. We will give him some tramadol to use for pain relief in the interim. Today I gave a printed prescription for tramadol 50 mg to take 1-2 every night as needed for pain #60+0 refill.  2. Claudication of right lower extremity [facility])    Signed, [address] [place], [place], [facility] 05/06/2017 2:43 PM

## 2017-05-14 ENCOUNTER — Other Ambulatory Visit: Payer: Self-pay | Admitting: Cardiovascular Disease

## 2017-05-14 DIAGNOSIS — I739 Peripheral vascular disease, unspecified: Secondary | ICD-10-CM

## 2017-05-17 ENCOUNTER — Other Ambulatory Visit: Payer: Self-pay | Admitting: Cardiovascular Disease

## 2017-05-23 ENCOUNTER — Other Ambulatory Visit: Payer: Self-pay | Admitting: Cardiovascular Disease

## 2017-05-24 ENCOUNTER — Ambulatory Visit (INDEPENDENT_AMBULATORY_CARE_PROVIDER_SITE_OTHER): Payer: BLUE CROSS/BLUE SHIELD

## 2017-05-24 DIAGNOSIS — I739 Peripheral vascular disease, unspecified: Secondary | ICD-10-CM

## 2017-06-10 ENCOUNTER — Ambulatory Visit: Payer: BLUE CROSS/BLUE SHIELD | Admitting: Cardiovascular Disease

## 2017-06-10 ENCOUNTER — Encounter: Payer: Self-pay | Admitting: Cardiovascular Disease

## 2017-06-10 VITALS — BP 138/60 | HR 64 | Ht 74.0 in | Wt 185.8 lb

## 2017-06-10 DIAGNOSIS — E785 Hyperlipidemia, unspecified: Secondary | ICD-10-CM | POA: Diagnosis not present

## 2017-06-10 DIAGNOSIS — Z720 Tobacco use: Secondary | ICD-10-CM | POA: Diagnosis not present

## 2017-06-10 DIAGNOSIS — I739 Peripheral vascular disease, unspecified: Secondary | ICD-10-CM

## 2017-06-10 DIAGNOSIS — I10 Essential (primary) hypertension: Secondary | ICD-10-CM

## 2017-06-10 NOTE — Progress Notes (Signed)
Cardiology Office Note   Date:  06/10/2017   ID:  Oscar Mills, DOB Apr 21, 1951, MRN [id number]  PCP:  Susy Frizzle, MD  Cardiologist:   [name], MD   Chief Complaint  Patient presents with  . OTHER    12 month f/u no complaints today. Meds reviewed verbally with pt.      History of Present Illness: Oscar Mills is a 66 y.o. male who presents for a followup visit regarding  peripheral arterial disease. He is a smoker with hypertension and a family history of heart disease. He has known occlusion of the right SFA and proximal popliteal artery with right calf claudication. Noninvasive vascular evaluation in June 2015 indicated an ABI of 0.44 on the right and 1.2 on the left.   angiographyIn September 2015 showed 1. No significant aortoiliac disease. No significant infrainguinal disease on the left.  2. Long occlusion of the right SFA from the proximal to the distal segment as well as a short occlusion in the proximal popliteal artery with three-vessel runoff below the knee.  He was treated medically with cilostazol with improvement in symptoms. Recently, he started having severe pain affecting the right foot.  The pain is happening at rest and the area is very tender to touch.  He saw a podiatrist and was diagnosed with stress fracture in that area.  He also complains of increased right hip pain with walking.  Right calf claudication seems to be stable from before. He underwent an ABI in our office which was normal on the left and stable on the right side at 0.5. Otherwise he is doing reasonably well and denies any chest pain or shortness of breath. He cut down on smoking but did not quit completely.   Past Medical History:  Diagnosis Date  . Arthritis    NECK  . Diabetes mellitus, type II ([place])    DIET CONTROLLED-NO MEDS  . Dysfunctional alcohol use    now quit  . GERD (gastroesophageal reflux disease)   . Hypertension   . Pancreatitis   .  Peripheral vascular disease [facility])     Past Surgical History:  Procedure Laterality Date  . LOWER EXTREMITY ANGIOGRAM Bilateral 02/24/2014   Procedure: LOWER EXTREMITY ANGIOGRAM;  Surgeon: Wellington Hampshire, MD;  Location: [facility];  Service: Cardiovascular;  Laterality: Bilateral;  . PERIPHERAL VASCULAR CATHETERIZATION    . UMBILICAL HERNIA REPAIR N/A 03/05/2017   Procedure: HERNIA REPAIR UMBILICAL ADULT;  Surgeon: Leonie Green, MD;  Location: [facility] ORS;  Service: General;  Laterality: N/A;     Current Outpatient Medications  Medication Sig Dispense Refill  . amLODipine (NORVASC) 10 MG tablet TAKE 1 TABLET(10 MG) BY MOUTH DAILY 90 tablet 0  . aspirin 81 MG tablet Take 81 mg by mouth daily.    Marland Kitchen atorvastatin (LIPITOR) 10 MG tablet TAKE 1 TABLET BY MOUTH EVERY DAY( CONTACT OFFICE TO SCHEDULE FUTURE APPOINTMENT AND REFILLS) (Patient taking differently: TAKE 1 TABLET BY MOUTH EVERY DAY( CONTACT OFFICE TO SCHEDULE FUTURE APPOINTMENT AND REFILLS)-AM) 90 tablet 3  . Blood Glucose Monitoring Suppl (ACCU-CHEK AVIVA PLUS) w/Device KIT Check BS bid 1 kit 0  . cilostazol (PLETAL) 100 MG tablet TAKE 1 TABLET(100 MG) BY MOUTH TWICE DAILY 180 tablet 0  . gabapentin (NEURONTIN) 300 MG capsule Take 300 mg daily by mouth. Take nightly  0  . glucose blood (ACCU-CHEK AVIVA PLUS) test strip Check BS BID - new onset dm E11.9 100 each 12  .  HYDROcodone-acetaminophen (NORCO) 7.5-325 MG tablet TK 1 T PO QHS  0  . ibuprofen (ADVIL,MOTRIN) 800 MG tablet Take 800 mg by mouth 3 (three) times daily.    . Lancets (ACCU-CHEK MULTICLIX) lancets Check BS bid - New onset DM E11.9 100 each 12  . pantoprazole (PROTONIX) 40 MG tablet TAKE 1 TABLET BY MOUTH DAILY 90 tablet 0  . tiZANidine (ZANAFLEX) 4 MG tablet TK 1 TO 2 TS PO QPM  6   No current facility-administered medications for this visit.     Allergies:   Iohexol    Social History:  The patient  reports that he has been smoking cigarettes.  He has a 7.50  pack-year smoking history. he has never used smokeless tobacco. He reports that he does not drink alcohol or use drugs.   Family History:  The patient's family history includes Heart attack in his father.    ROS:  Please see the history of present illness.   Otherwise, review of systems are positive for none.   All other systems are reviewed and negative.    PHYSICAL EXAM: VS:  BP 138/60 (BP Location: Left Arm, Patient Position: Sitting, Cuff Size: Normal)   Pulse 64   Ht _0  (1.88 m)   Wt 185 lb 12 oz (84.3 kg)   BMI 23.85 kg/m  , BMI Body mass index is 23.85 kg/m. GEN: Well nourished, well developed, in no acute distress  HEENT: normal  Neck: no JVD, carotid bruits, or masses Cardiac: RRR; no murmurs, rubs, or gallops,no edema  Respiratory:  clear to auscultation bilaterally, normal work of breathing GI: soft, nontender, nondistended, + BS MS: no deformity or atrophy  Skin: warm and dry, no rash Neuro:  Strength and sensation are intact Psych: euthymic mood, full affect Vascular: Femoral pulses +1 on the right side and +2 on the left.  Distal pulses are palpable on the left side only.  There is dependent rubor on the right side   EKG:  EKG is ordered today. The ekg ordered today demonstrates normal sinus rhythm with no significant ST or T wave changes.   Recent Labs: 11/09/2016: ALT 7; BUN 12; Creat 0.80; Hemoglobin 14.6; Platelets 272; Potassium 3.9; Sodium 140    Lipid Panel    Component Value Date/Time   CHOL 84 03/01/2016 0949   CHOL 94 (L) 05/14/2014 0829   TRIG 71 03/01/2016 0949   HDL 30 (L) 03/01/2016 0949   HDL 38 (L) 05/14/2014 0829   CHOLHDL 2.8 03/01/2016 0949   VLDL 14 03/01/2016 0949   LDLCALC 40 03/01/2016 0949   LDLCALC 36 05/14/2014 0829      Wt Readings from Last 3 Encounters:  06/10/17 185 lb 12 oz (84.3 kg)  05/06/17 189 lb 3.2 oz (85.8 kg)  03/05/17 180 lb (81.6 kg)       No flowsheet data found.    ASSESSMENT AND PLAN:  1.   Peripheral arterial disease: The patient's severe right foot pain does not seem to be due to peripheral arterial disease and more consistent with stress fracture that he was diagnosed with especially with the localized tenderness in that area.  He reports gradual improvement in this.  The patient is known to have peripheral arterial disease with stable right calf claudication.  He is now having some right hip discomfort with walking and of unclear etiology.  Right femoral pulse is mildly diminished compared to the left and thus I think we have to exclude iliac disease on that  side.  I requested an aortoiliac duplex.  Interestingly, that ABI was stable from before. I am going to reevaluate his symptoms in 2 months with low threshold for angiography for residual symptoms.  2. Essential hypertension: Blood pressure is controlled on amlodipine.  3. Hyperlipidemia: Continue treatment with atorvastatin.  Most recent lipid profile was optimal.  4. Tobacco use: He cut down but did not quit completely.  I again discussed with him the importance of smoking cessation.    Disposition:   FU with me in 1 year  Signed,  [name], MD  06/10/2017 2:38 PM    [place]

## 2017-06-10 NOTE — Patient Instructions (Signed)
Medication Instructions:  Your physician recommends that you continue on your current medications as directed. Please refer to the Current Medication list given to you today.   Labwork: none  Testing/Procedures: Your physician has requested that you have an abdominal aorta iliac duplex. During this test, an ultrasound is used to evaluate the aorta and iliac arteries. Allow 30 minutes for this exam. Do not eat after midnight the day before and avoid carbonated beverages   Follow-Up: Your physician recommends that you schedule a follow-up appointment in: 2 months with Dr. Fletcher Anon.    Any Other Special Instructions Will Be Listed Below (If Applicable).     If you need a refill on your cardiac medications before your next appointment, please call your pharmacy.

## 2017-07-20 ENCOUNTER — Other Ambulatory Visit: Payer: Self-pay | Admitting: Cardiovascular Disease

## 2017-08-13 ENCOUNTER — Ambulatory Visit: Payer: BLUE CROSS/BLUE SHIELD | Admitting: Cardiovascular Disease

## 2017-08-16 ENCOUNTER — Other Ambulatory Visit: Payer: Self-pay | Admitting: Cardiovascular Disease

## 2017-08-19 ENCOUNTER — Other Ambulatory Visit: Payer: Self-pay | Admitting: Cardiovascular Disease

## 2017-08-23 ENCOUNTER — Ambulatory Visit (INDEPENDENT_AMBULATORY_CARE_PROVIDER_SITE_OTHER): Payer: BLUE CROSS/BLUE SHIELD

## 2017-08-23 DIAGNOSIS — I739 Peripheral vascular disease, unspecified: Secondary | ICD-10-CM | POA: Diagnosis not present

## 2017-10-07 ENCOUNTER — Other Ambulatory Visit: Payer: Self-pay | Admitting: Cardiovascular Disease

## 2017-10-18 ENCOUNTER — Telehealth: Payer: Self-pay | Admitting: Cardiovascular Disease

## 2017-10-18 ENCOUNTER — Other Ambulatory Visit: Payer: Self-pay

## 2017-10-18 MED ORDER — AMLODIPINE BESYLATE 10 MG PO TABS: ORAL_TABLET | ORAL | 0 refills | Status: DC

## 2017-10-18 NOTE — Telephone Encounter (Signed)
Patient wife Oscar Mills called back to schedule appt.  5/30 with Arida   Please send refill patient is out of bp medications.  When spouse asked if Oscar Mills would be ok with patient not taking medication she was notified that 30 day refill would be sent but to please  Notified patient of importance of keeping upcoming appt.

## 2017-10-18 NOTE — Telephone Encounter (Signed)
amLODipine (NORVASC) 10 MG tablet 30 tablet 0 10/18/2017    Sig: TAKE 1 TABLET(10 MG) BY MOUTH DAILY   Sent to pharmacy as: amLODipine (NORVASC) 10 MG tablet   E-Prescribing Status: Receipt confirmed by pharmacy (10/18/2017 3:33 PM EDT)   Pharmacy   [id number] - [place], [place]. [address]

## 2017-10-18 NOTE — Telephone Encounter (Signed)
Patient out of town working and will have wife call back another time to schedule an appt.

## 2017-10-18 NOTE — Telephone Encounter (Signed)
-----   Message from Janan Ridge, [place] sent at 10/08/2017  7:36 AM EDT ----- Can you please try to schedule an appointment with Dr. Fletcher Anon. Patient was last seen in 05/2017 and was instructed to follow up in 2 months. Patient cancelled that appointment.   Will send in a 30 day refill on medication until appointment is made.   Thank you!

## 2017-11-05 ENCOUNTER — Other Ambulatory Visit: Payer: Self-pay | Admitting: Cardiovascular Disease

## 2017-11-22 ENCOUNTER — Encounter: Payer: Self-pay | Admitting: Cardiovascular Disease

## 2017-11-22 ENCOUNTER — Ambulatory Visit: Payer: BLUE CROSS/BLUE SHIELD | Admitting: Cardiovascular Disease

## 2017-11-22 VITALS — BP 100/60 | HR 62 | Ht 73.0 in | Wt 180.8 lb

## 2017-11-22 DIAGNOSIS — I10 Essential (primary) hypertension: Secondary | ICD-10-CM | POA: Diagnosis not present

## 2017-11-22 DIAGNOSIS — I739 Peripheral vascular disease, unspecified: Secondary | ICD-10-CM | POA: Diagnosis not present

## 2017-11-22 DIAGNOSIS — Z720 Tobacco use: Secondary | ICD-10-CM

## 2017-11-22 DIAGNOSIS — E785 Hyperlipidemia, unspecified: Secondary | ICD-10-CM | POA: Diagnosis not present

## 2017-11-22 MED ORDER — CILOSTAZOL 100 MG PO TABS: ORAL_TABLET | ORAL | 3 refills | Status: DC

## 2017-11-22 MED ORDER — ATORVASTATIN CALCIUM 10 MG PO TABS: ORAL_TABLET | ORAL | 3 refills | Status: DC

## 2017-11-22 MED ORDER — AMLODIPINE BESYLATE 5 MG PO TABS: 5.0000 mg | ORAL_TABLET | Freq: Every day | ORAL | 3 refills | Status: DC

## 2017-11-22 NOTE — Progress Notes (Signed)
Cardiology Office Note   Date:  11/22/2017   ID:  Oscar Mills, DOB 1950-08-30, MRN [id number]  PCP:  Susy Frizzle, MD  Cardiologist:   [name], MD   Chief Complaint  Patient presents with  . OTHER    Past due f/u pt mentioned requested to have glucose checked hasn't had it checked or doesn't have a way to check it @ home . Meds reviewed verbally with pt.      History of Present Illness: Oscar Mills is a 67 y.o. male who presents for a followup visit regarding  peripheral arterial disease. He is a smoker with hypertension and a family history of heart disease. He has known occlusion of the right SFA and proximal popliteal artery with right calf claudication. Noninvasive vascular evaluation in June 2015 indicated an ABI of 0.44 on the right and 1.2 on the left.   angiographyIn September 2015 showed 1. No significant aortoiliac disease. No significant infrainguinal disease on the left.  2. Long occlusion of the right SFA from the proximal to the distal segment as well as a short occlusion in the proximal popliteal artery with three-vessel runoff below the knee.  He was treated medically with cilostazol with improvement in symptoms. He has been doing reasonably well and denies any chest pain, shortness of breath or palpitations.  He reports stable right calf claudication.   Past Medical History:  Diagnosis Date  . Arthritis    NECK  . Diabetes mellitus, type II ([place])    DIET CONTROLLED-NO MEDS  . Dysfunctional alcohol use    now quit  . GERD (gastroesophageal reflux disease)   . Hypertension   . Pancreatitis   . Peripheral vascular disease ([place])   . Stress fracture of ankle     Past Surgical History:  Procedure Laterality Date  . LOWER EXTREMITY ANGIOGRAM Bilateral 02/24/2014   Procedure: LOWER EXTREMITY ANGIOGRAM;  Surgeon: Wellington Hampshire, MD;  Location: [facility];  Service: Cardiovascular;  Laterality: Bilateral;  . PERIPHERAL VASCULAR  CATHETERIZATION    . UMBILICAL HERNIA REPAIR N/A 03/05/2017   Procedure: HERNIA REPAIR UMBILICAL ADULT;  Surgeon: Leonie Green, MD;  Location: [facility] ORS;  Service: General;  Laterality: N/A;     Current Outpatient Medications  Medication Sig Dispense Refill  . Acetaminophen (TYLENOL PO) Take by mouth as needed.    Marland Kitchen amLODipine (NORVASC) 10 MG tablet TAKE 1 TABLET(10 MG) BY MOUTH DAILY 30 tablet 0  . aspirin 81 MG tablet Take 81 mg by mouth daily.    Marland Kitchen atorvastatin (LIPITOR) 10 MG tablet TAKE 1 TABLET BY MOUTH EVERY DAY( CONTACT OFFICE TO SCHEDULE FUTURE APPOINTMENT AND REFILLS) 90 tablet 0  . Blood Glucose Monitoring Suppl (ACCU-CHEK AVIVA PLUS) w/Device KIT Check BS bid 1 kit 0  . cilostazol (PLETAL) 100 MG tablet TAKE 1 TABLET(100 MG) BY MOUTH TWICE DAILY 180 tablet 0  . gabapentin (NEURONTIN) 300 MG capsule Take 300 mg daily by mouth. Take nightly  0  . glucose blood (ACCU-CHEK AVIVA PLUS) test strip Check BS BID - new onset dm E11.9 100 each 12  . Lancets (ACCU-CHEK MULTICLIX) lancets Check BS bid - New onset DM E11.9 100 each 12  . pantoprazole (PROTONIX) 40 MG tablet TAKE 1 TABLET BY MOUTH DAILY 30 tablet 0   No current facility-administered medications for this visit.     Allergies:   Iohexol    Social History:  The patient  reports that he has been  smoking cigarettes.  He has a 7.50 pack-year smoking history. He has never used smokeless tobacco. He reports that he does not drink alcohol or use drugs.   Family History:  The patient's family history includes Heart attack in his father.    ROS:  Please see the history of present illness.   Otherwise, review of systems are positive for none.   All other systems are reviewed and negative.    PHYSICAL EXAM: VS:  BP 100/60 (BP Location: Left Arm, Patient Position: Sitting, Cuff Size: Normal)   Pulse 62   Ht 6' 1"  (1.854 m)   Wt 180 lb 12 oz (82 kg)   BMI 23.85 kg/m  , BMI Body mass index is 23.85 kg/m. GEN: Well  nourished, well developed, in no acute distress  HEENT: normal  Neck: no JVD, carotid bruits, or masses Cardiac: RRR; no murmurs, rubs, or gallops,no edema  Respiratory:  clear to auscultation bilaterally, normal work of breathing GI: soft, nontender, nondistended, + BS MS: no deformity or atrophy  Skin: warm and dry, no rash Neuro:  Strength and sensation are intact Psych: euthymic mood, full affect Vascular: Femoral pulses +1 on the right side and +2 on the left.  Distal pulses are palpable on the left side only.  There is dependent rubor on the right side   EKG:  EKG is ordered today. The ekg ordered today demonstrates normal sinus rhythm with no significant ST or T wave changes.   Recent Labs: No results found for requested labs within last 8760 hours.    Lipid Panel    Component Value Date/Time   CHOL 84 03/01/2016 0949   CHOL 94 (L) 05/14/2014 0829   TRIG 71 03/01/2016 0949   HDL 30 (L) 03/01/2016 0949   HDL 38 (L) 05/14/2014 0829   CHOLHDL 2.8 03/01/2016 0949   VLDL 14 03/01/2016 0949   LDLCALC 40 03/01/2016 0949   LDLCALC 36 05/14/2014 0829      Wt Readings from Last 3 Encounters:  11/22/17 180 lb 12 oz (82 kg)  06/10/17 185 lb 12 oz (84.3 kg)  05/06/17 189 lb 3.2 oz (85.8 kg)       No flowsheet data found.    ASSESSMENT AND PLAN:  1.  Peripheral arterial disease: The patient has stable mild right calf claudication due to known SFA occlusion. Symptoms are overall stable on cilostazol.  2. Essential hypertension: Blood pressure is running low.  I decreased amlodipine to 5 mg daily.  3. Hyperlipidemia: Continue treatment with atorvastatin with a target LDL of less than 70.  4. Tobacco use: I again discussed the importance of smoking cessation.    Disposition:   FU with me in 6 months  Signed,  [name], MD  11/22/2017 2:12 PM    [facility]

## 2017-11-22 NOTE — Patient Instructions (Addendum)
Medication Instructions: -  Your physician has recommended you make the following change in your medication:  1) DECREASE norvasc (amlodipine) to 5 mg - take 1 tablet by mouth once daily  Labwork: - none ordered  Procedures/Testing: - none ordered  Follow-Up: - Your physician wants you to follow-up in: 6 months with Dr. Fletcher Anon. You will receive a reminder letter in the mail two months in advance. If you don't receive a letter, please call our office to schedule the follow-up appointment.   Any Additional Special Instructions Will Be Listed Below (If Applicable).     If you need a refill on your cardiac medications before your next appointment, please call your pharmacy.

## 2017-12-22 ENCOUNTER — Other Ambulatory Visit: Payer: Self-pay | Admitting: Cardiovascular Disease

## 2017-12-31 DIAGNOSIS — L600 Ingrowing nail: Secondary | ICD-10-CM | POA: Diagnosis not present

## 2018-01-21 ENCOUNTER — Ambulatory Visit: Payer: Medicare HMO | Admitting: Podiatry

## 2018-01-21 ENCOUNTER — Encounter: Payer: Self-pay | Admitting: Podiatry

## 2018-01-21 DIAGNOSIS — B351 Tinea unguium: Secondary | ICD-10-CM

## 2018-01-21 DIAGNOSIS — M79676 Pain in unspecified toe(s): Secondary | ICD-10-CM

## 2018-01-21 DIAGNOSIS — L600 Ingrowing nail: Secondary | ICD-10-CM | POA: Diagnosis not present

## 2018-01-21 MED ORDER — GENTAMICIN SULFATE 0.1 % EX CREA: 1.0000 "application " | TOPICAL_CREAM | Freq: Three times a day (TID) | CUTANEOUS | 1 refills | Status: DC

## 2018-01-21 NOTE — Patient Instructions (Signed)

## 2018-01-26 ENCOUNTER — Other Ambulatory Visit: Payer: Self-pay | Admitting: Cardiovascular Disease

## 2018-01-26 NOTE — Progress Notes (Signed)
   Subjective: Patient presents today for evaluation of pain to the medial and lateral borders of the right hallux that began a few years ago. Patient is concerned for possible ingrown nail. Touching the nail increases the pain. He has tried cutting the nail out himself but with no success. Patient presents today for further treatment and evaluation.  Past Medical History:  Diagnosis Date  . Arthritis    NECK  . Diabetes mellitus, type II ([place])    DIET CONTROLLED-NO MEDS  . Dysfunctional alcohol use    now quit  . GERD (gastroesophageal reflux disease)   . Hypertension   . Pancreatitis   . Peripheral vascular disease ([place])   . Stress fracture of ankle     Objective:  General: Well developed, nourished, in no acute distress, alert and oriented x3   Dermatology: Skin is warm, dry and supple bilateral. Medial and lateral borders of the the right hallux appears to be erythematous with evidence of an ingrowing nail. Pain on palpation noted to the border of the nail fold. The remaining nails appear unremarkable at this time. There are no open sores, lesions.  Vascular: Dorsalis Pedis artery and Posterior Tibial artery pedal pulses palpable. No lower extremity edema noted.   Neruologic: Grossly intact via light touch bilateral.  Musculoskeletal: Muscular strength within normal limits in all groups bilateral. Normal range of motion noted to all pedal and ankle joints.   Assesement: #1 Paronychia with ingrowing nail medial and lateral borders right hallux #2 Pain in toe #3 Incurvated nail #4 Onychodystrophic nails 1-5 bilateral with hyperkeratosis of nails.  #5 Onychomycosis of nail due to dermatophyte bilateral  Plan of Care:  1. Patient evaluated.  2. Discussed treatment alternatives and plan of care. Explained nail avulsion procedure and post procedure course to patient. 3. Patient opted for permanent partial nail avulsion to the bilateral borders of the right hallux.  4. Prior to  procedure, local anesthesia infiltration utilized using 3 ml of a 50:50 mixture of 2% plain lidocaine and 0.5% plain marcaine in a normal hallux block fashion and a betadine prep performed.  5. Partial permanent nail avulsion with chemical matrixectomy performed using [id number] applications of phenol followed by alcohol flush.  6. Light dressing applied. 7. Mechanical debridement of nails 1-5 bilaterally performed using a nail nipper. Filed with dremel without incident.  8. Prescription for gentamicin cream to be applied daily with a bandage provided to patient.  9. Return to clinic in 2 weeks.   Edrick Kins, DPM [facility]  Dr. Edrick Kins, [address]                                        [place],  [address]                Office [phone]  Fax [phone]

## 2018-02-03 DIAGNOSIS — G2581 Restless legs syndrome: Secondary | ICD-10-CM | POA: Diagnosis not present

## 2018-02-03 DIAGNOSIS — M5442 Lumbago with sciatica, left side: Secondary | ICD-10-CM | POA: Diagnosis not present

## 2018-02-03 DIAGNOSIS — G5603 Carpal tunnel syndrome, bilateral upper limbs: Secondary | ICD-10-CM | POA: Diagnosis not present

## 2018-02-03 DIAGNOSIS — G603 Idiopathic progressive neuropathy: Secondary | ICD-10-CM | POA: Diagnosis not present

## 2018-02-03 DIAGNOSIS — R202 Paresthesia of skin: Secondary | ICD-10-CM | POA: Diagnosis not present

## 2018-02-03 DIAGNOSIS — M5441 Lumbago with sciatica, right side: Secondary | ICD-10-CM | POA: Diagnosis not present

## 2018-02-07 ENCOUNTER — Ambulatory Visit: Payer: Medicare HMO | Admitting: Podiatry

## 2018-02-18 ENCOUNTER — Ambulatory Visit: Payer: Medicare HMO | Admitting: Podiatry

## 2018-02-21 ENCOUNTER — Encounter: Payer: Self-pay | Admitting: Podiatry

## 2018-02-21 ENCOUNTER — Ambulatory Visit: Payer: Medicare HMO | Admitting: Podiatry

## 2018-02-21 DIAGNOSIS — L600 Ingrowing nail: Secondary | ICD-10-CM

## 2018-02-21 DIAGNOSIS — G5761 Lesion of plantar nerve, right lower limb: Secondary | ICD-10-CM

## 2018-02-21 MED ORDER — DOXYCYCLINE HYCLATE 100 MG PO TABS: 100.0000 mg | ORAL_TABLET | Freq: Two times a day (BID) | ORAL | 0 refills | Status: DC

## 2018-02-21 MED ORDER — GENTAMICIN SULFATE 0.1 % EX CREA: 1.0000 "application " | TOPICAL_CREAM | Freq: Three times a day (TID) | CUTANEOUS | 1 refills | Status: DC

## 2018-02-21 NOTE — Progress Notes (Signed)
   Subjective: Patient presents today 2 weeks post ingrown nail permanent nail avulsion procedure of the medial and lateral borders of the right hallux. He reports some continued soreness and has been soaking it for treatment. He has been using the gentamicin cream as directed. There are no modifying factors noted.  He also has a new complaint of burning pain to the right forefoot that began 6 months ago. He has been taking Lyrica but states it does not help. He denies modifying factors. Patient is here for further evaluation and treatment.   Past Medical History:  Diagnosis Date  . Arthritis    NECK  . Diabetes mellitus, type II ([place])    DIET CONTROLLED-NO MEDS  . Dysfunctional alcohol use    now quit  . GERD (gastroesophageal reflux disease)   . Hypertension   . Pancreatitis   . Peripheral vascular disease ([place])   . Stress fracture of ankle     Objective: Skin is warm, dry and supple. Nail and respective nail fold appears to be healing appropriately. Open wound to the associated nail fold with a granular wound base and moderate amount of fibrotic tissue. Minimal drainage noted. Mild erythema around the periungual region likely due to phenol chemical matricectomy. Sharp pain with palpation of the right 4th interspace and lateral compression of the metatarsal heads consistent with neuroma.  Positive Conley Canal sign with loadbearing of the forefoot.   Assessment: #1 postop permanent partial nail avulsion medial and lateral borders right hallux #2 open wound periungual nail fold of respective digit.  #3 Morton's neuroma right 4th interspace #4 Lumbar radiculopathy RLE  Plan of care: #1 patient was evaluated  #2 debridement of open wound was performed to the periungual border of the respective toe using a currette. Antibiotic ointment and Band-Aid was applied. #3 Prescription for gentamicin cream provided to patient.  #4 Prescription for Doxycycline provided to patient.  #5 Injection  of 0.5 mLs Celestone Soluspan injected into the neuroma of the right foot.  #6 Continue taking Gabapentin from neurologist. Follow up with neurology in October.  #7 patient is to return to clinic in 2 weeks.    Edrick Kins, DPM [facility]  Dr. Edrick Kins, [address]                                        [place], [place] [address]                Office [phone]  Fax [phone]

## 2018-03-14 ENCOUNTER — Ambulatory Visit: Payer: Medicare HMO | Admitting: Podiatry

## 2018-04-01 ENCOUNTER — Ambulatory Visit (INDEPENDENT_AMBULATORY_CARE_PROVIDER_SITE_OTHER): Payer: Medicare HMO | Admitting: Podiatry

## 2018-04-01 DIAGNOSIS — G5761 Lesion of plantar nerve, right lower limb: Secondary | ICD-10-CM

## 2018-04-01 NOTE — Progress Notes (Signed)
   HPI: 67 year old male presenting today for follow up evaluation of a neuroma of the right foot. He notes an open wound but states it is improving. Receiving the injection at the previous visit helped alleviate the pain. There are no modifying factors noted. Patient is here for further evaluation and treatment.   Past Medical History:  Diagnosis Date  . Arthritis    NECK  . Diabetes mellitus, type II ([place])    DIET CONTROLLED-NO MEDS  . Dysfunctional alcohol use    now quit  . GERD (gastroesophageal reflux disease)   . Hypertension   . Pancreatitis   . Peripheral vascular disease ([place])   . Stress fracture of ankle       Physical Exam: General: The patient is alert and oriented x3 in no acute distress.  Dermatology: Skin is warm, dry and supple bilateral lower extremities. Negative for open lesions or macerations.  Vascular: Palpable pedal pulses bilaterally. No edema or erythema noted. Capillary refill within normal limits.  Neurological: Epicritic and protective threshold grossly intact bilaterally.   Musculoskeletal Exam: Sharp pain with palpation of the 3rd interspace and lateral compression of the metatarsal heads consistent with neuroma.  Positive Conley Canal sign with loadbearing of the forefoot.  Assessment: 1. Morton's neuroma 3rd interspace right foot 2. Lumbar radiculopathy RLE  Plan of Care:  1. Patient was evaluated. 2. Injection of 0.5 mLs Celestone Soluspan injected into the 3rd interspace neuroma of the right foot.  3. Recommended good shoe gear.  4. Continue taking Gabapentin from neurologist. Follow up with neurology on 04/03/18.  5. Return to clinic as needed.    Edrick Kins, DPM [facility]  Dr. Edrick Kins, [place]                                        [place], [place] [address]                Office [phone]  Fax [phone]

## 2018-04-03 DIAGNOSIS — G5603 Carpal tunnel syndrome, bilateral upper limbs: Secondary | ICD-10-CM | POA: Diagnosis not present

## 2018-04-03 DIAGNOSIS — R202 Paresthesia of skin: Secondary | ICD-10-CM | POA: Diagnosis not present

## 2018-04-03 DIAGNOSIS — M5412 Radiculopathy, cervical region: Secondary | ICD-10-CM | POA: Diagnosis not present

## 2018-04-03 DIAGNOSIS — R201 Hypoesthesia of skin: Secondary | ICD-10-CM | POA: Diagnosis not present

## 2018-04-03 DIAGNOSIS — G2581 Restless legs syndrome: Secondary | ICD-10-CM | POA: Diagnosis not present

## 2018-04-03 DIAGNOSIS — M5417 Radiculopathy, lumbosacral region: Secondary | ICD-10-CM | POA: Diagnosis not present

## 2018-04-03 DIAGNOSIS — G603 Idiopathic progressive neuropathy: Secondary | ICD-10-CM | POA: Diagnosis not present

## 2018-04-03 DIAGNOSIS — R2689 Other abnormalities of gait and mobility: Secondary | ICD-10-CM | POA: Diagnosis not present

## 2018-04-04 ENCOUNTER — Other Ambulatory Visit: Payer: Self-pay | Admitting: Cardiovascular Disease

## 2018-05-19 ENCOUNTER — Other Ambulatory Visit: Payer: Self-pay | Admitting: Cardiovascular Disease

## 2018-05-30 ENCOUNTER — Other Ambulatory Visit: Payer: Self-pay | Admitting: Cardiovascular Disease

## 2018-06-05 ENCOUNTER — Telehealth: Payer: Self-pay | Admitting: Cardiovascular Disease

## 2018-06-05 NOTE — Telephone Encounter (Signed)
No ans no vm   °

## 2018-06-05 NOTE — Telephone Encounter (Signed)
-----   Message from Anselm Pancoast, [place] sent at 06/03/2018  3:30 PM EST ----- Please contact patient for a follow up appointment.  The patient was to follow up in Nov. 2019 with Dr. Fletcher Anon.  Thanks, Ivin Booty

## 2018-06-07 ENCOUNTER — Other Ambulatory Visit: Payer: Self-pay | Admitting: Cardiovascular Disease

## 2018-06-09 ENCOUNTER — Other Ambulatory Visit: Payer: Self-pay | Admitting: Cardiovascular Disease

## 2018-06-09 ENCOUNTER — Telehealth: Payer: Self-pay

## 2018-06-09 MED ORDER — PANTOPRAZOLE SODIUM 40 MG PO TBEC: 40.0000 mg | DELAYED_RELEASE_TABLET | Freq: Every day | ORAL | 0 refills | Status: DC

## 2018-06-09 NOTE — Telephone Encounter (Signed)
Patient pharmacy is requesting refills.  Patient was last seen 10/2017 and was to follow up in 6 months. (04/2018) LMOV to see if now would be a good time to schedule that appointment and get refills sent in for patient.  I will send in a 30 day supply with 0 refills until patient calls our office back.    Requested Prescriptions   Signed Prescriptions Disp Refills  . pantoprazole (PROTONIX) 40 MG tablet 30 tablet 0    Sig: Take 1 tablet (40 mg total) by mouth daily.    Authorizing Provider: [name] A    Ordering User: Janan Ridge

## 2018-06-09 NOTE — Telephone Encounter (Signed)
pantoprazole (PROTONIX) 40 MG tablet 30 tablet 0 06/09/2018    Sig - Route: Take 1 tablet (40 mg total) by mouth daily. - Oral   Sent to pharmacy as: pantoprazole (PROTONIX) 40 MG tablet   Notes to Pharmacy: Pt needs to contact office to schedule future appointment and refills, Thank you. [phone]   E-Prescribing Status: Sent to pharmacy (06/09/2018 11:35 AM EST)   Pharmacy   [id number] [address]on, [place]

## 2018-06-09 NOTE — Telephone Encounter (Signed)
Scheduled in February.

## 2018-06-12 DIAGNOSIS — R202 Paresthesia of skin: Secondary | ICD-10-CM | POA: Diagnosis not present

## 2018-06-12 DIAGNOSIS — M79671 Pain in right foot: Secondary | ICD-10-CM | POA: Diagnosis not present

## 2018-06-12 DIAGNOSIS — G5603 Carpal tunnel syndrome, bilateral upper limbs: Secondary | ICD-10-CM | POA: Diagnosis not present

## 2018-06-12 DIAGNOSIS — G2581 Restless legs syndrome: Secondary | ICD-10-CM | POA: Diagnosis not present

## 2018-06-12 DIAGNOSIS — M5442 Lumbago with sciatica, left side: Secondary | ICD-10-CM | POA: Diagnosis not present

## 2018-06-12 DIAGNOSIS — M5441 Lumbago with sciatica, right side: Secondary | ICD-10-CM | POA: Diagnosis not present

## 2018-07-04 ENCOUNTER — Other Ambulatory Visit: Payer: Self-pay | Admitting: Cardiovascular Disease

## 2018-08-02 ENCOUNTER — Other Ambulatory Visit: Payer: Self-pay | Admitting: Cardiovascular Disease

## 2018-08-08 ENCOUNTER — Ambulatory Visit: Payer: BLUE CROSS/BLUE SHIELD | Admitting: Cardiovascular Disease

## 2018-08-14 DIAGNOSIS — L708 Other acne: Secondary | ICD-10-CM | POA: Diagnosis not present

## 2018-08-14 DIAGNOSIS — L821 Other seborrheic keratosis: Secondary | ICD-10-CM | POA: Diagnosis not present

## 2018-08-14 DIAGNOSIS — L720 Epidermal cyst: Secondary | ICD-10-CM | POA: Diagnosis not present

## 2018-08-21 DIAGNOSIS — M545 Low back pain: Secondary | ICD-10-CM | POA: Diagnosis not present

## 2018-08-21 DIAGNOSIS — M79671 Pain in right foot: Secondary | ICD-10-CM | POA: Diagnosis not present

## 2018-08-21 DIAGNOSIS — G2581 Restless legs syndrome: Secondary | ICD-10-CM | POA: Diagnosis not present

## 2018-08-21 DIAGNOSIS — G5603 Carpal tunnel syndrome, bilateral upper limbs: Secondary | ICD-10-CM | POA: Diagnosis not present

## 2018-10-06 ENCOUNTER — Other Ambulatory Visit: Payer: Self-pay | Admitting: Cardiovascular Disease

## 2018-10-07 ENCOUNTER — Telehealth: Payer: Self-pay | Admitting: Cardiovascular Disease

## 2018-10-07 NOTE — Telephone Encounter (Signed)
Virtual Visit Pre-Appointment Phone Call  Steps For Call:  1. Confirm consent - "In the setting of the current Covid19 crisis, you are scheduled for a (phone or video) visit with your provider on (date) at (time).  Just as we do with many in-office visits, in order for you to participate in this visit, we must obtain consent.  If you'd like, I can send this to your mychart (if signed up) or email for you to review.  Otherwise, I can obtain your verbal consent now.  All virtual visits are billed to your insurance company just like a normal visit would be.  By agreeing to a virtual visit, we'd like you to understand that the technology does not allow for your provider to perform an examination, and thus may limit your provider's ability to fully assess your condition.  Finally, though the technology is pretty good, we cannot assure that it will always work on either your or our end, and in the setting of a video visit, we may have to convert it to a phone-only visit.  In either situation, we cannot ensure that we have a secure connection.  Are you willing to proceed?"  2. Give patient instructions for WebEx download to smartphone as below if video visit  3. Advise patient to be prepared with any vital sign or heart rhythm information, their current medicines, and a piece of paper and pen handy for any instructions they may receive the day of their visit  4. Inform patient they will receive a phone call 15 minutes prior to their appointment time (may be from unknown caller ID) so they should be prepared to answer  5. Confirm that appointment type is correct in Epic appointment notes (video vs telephone)    TELEPHONE CALL NOTE  Oscar Mills has been deemed a candidate for a follow-up tele-health visit to limit community exposure during the Covid-19 pandemic. I spoke with the patient via phone to ensure availability of phone/video source, confirm preferred email & phone number, and discuss  instructions and expectations.  I reminded Oscar Mills to be prepared with any vital sign and/or heart rhythm information that could potentially be obtained via home monitoring, at the time of his visit. I reminded Oscar Mills to expect a phone call at the time of his visit if his visit.  Did the patient verbally acknowledge consent to treatment? Yes   Oscar Mills 10/07/2018 8:45 AM   DOWNLOADING THE WEBEX SOFTWARE TO SMARTPHONE  - If Apple, go to [employer] and type in WebEx in the search bar. [place] [employer], the blue/green circle. The app is free but as with any other app downloads, their phone may require them to verify saved payment information or Apple password. The patient does NOT have to create an account.  - If Android, ask patient to go to [employer] and type in WebEx in the search bar. [place] [employer], the blue/green circle. The app is free but as with any other app downloads, their phone may require them to verify saved payment information or Android password. The patient does NOT have to create an account.   CONSENT FOR TELE-HEALTH VISIT - PLEASE REVIEW  I hereby voluntarily request, consent and authorize [facility] and its employed or contracted physicians, physician assistants, nurse practitioners or other licensed health care professionals (the Practitioner), to provide me with telemedicine health care services (the Services") as deemed necessary by the treating Practitioner. I  acknowledge and consent to receive the Services by the Practitioner via telemedicine. I understand that the telemedicine visit will involve communicating with the Practitioner through live audiovisual communication technology and the disclosure of certain medical information by electronic transmission. I acknowledge that I have been given the opportunity to request an in-person assessment or other available alternative prior to the telemedicine visit  and am voluntarily participating in the telemedicine visit.  I understand that I have the right to withhold or withdraw my consent to the use of telemedicine in the course of my care at any time, without affecting my right to future care or treatment, and that the Practitioner or I may terminate the telemedicine visit at any time. I understand that I have the right to inspect all information obtained and/or recorded in the course of the telemedicine visit and may receive copies of available information for a reasonable fee.  I understand that some of the potential risks of receiving the Services via telemedicine include:   Delay or interruption in medical evaluation due to technological equipment failure or disruption;  Information transmitted may not be sufficient (e.g. poor resolution of images) to allow for appropriate medical decision making by the Practitioner; and/or   In rare instances, security protocols could fail, causing a breach of personal health information.  Furthermore, I acknowledge that it is my responsibility to provide information about my medical history, conditions and care that is complete and accurate to the best of my ability. I acknowledge that Practitioner's advice, recommendations, and/or decision may be based on factors not within their control, such as incomplete or inaccurate data provided by me or distortions of diagnostic images or specimens that may result from electronic transmissions. I understand that the practice of medicine is not an exact science and that Practitioner makes no warranties or guarantees regarding treatment outcomes. I acknowledge that I will receive a copy of this consent concurrently upon execution via email to the email address I last provided but may also request a printed copy by calling the office of [facility].    I understand that my insurance will be billed for this visit.   I have read or had this consent read to me.  I understand  the contents of this consent, which adequately explains the benefits and risks of the Services being provided via telemedicine.   I have been provided ample opportunity to ask questions regarding this consent and the Services and have had my questions answered to my satisfaction.  I give my informed consent for the services to be provided through the use of telemedicine in my medical care  By participating in this telemedicine visit I agree to the above.

## 2018-10-16 ENCOUNTER — Telehealth (INDEPENDENT_AMBULATORY_CARE_PROVIDER_SITE_OTHER): Payer: Medicare HMO | Admitting: Cardiovascular Disease

## 2018-10-16 ENCOUNTER — Other Ambulatory Visit: Payer: Self-pay

## 2018-10-16 ENCOUNTER — Encounter: Payer: Self-pay | Admitting: Cardiovascular Disease

## 2018-10-16 VITALS — HR 62 | Ht 74.0 in | Wt 180.0 lb

## 2018-10-16 DIAGNOSIS — I739 Peripheral vascular disease, unspecified: Secondary | ICD-10-CM

## 2018-10-16 MED ORDER — AMLODIPINE BESYLATE 10 MG PO TABS: 10.0000 mg | ORAL_TABLET | Freq: Every day | ORAL | 3 refills | Status: DC

## 2018-10-16 MED ORDER — CILOSTAZOL 100 MG PO TABS: ORAL_TABLET | ORAL | 3 refills | Status: DC

## 2018-10-16 MED ORDER — PANTOPRAZOLE SODIUM 40 MG PO TBEC: 40.0000 mg | DELAYED_RELEASE_TABLET | Freq: Every day | ORAL | 3 refills | Status: DC

## 2018-10-16 MED ORDER — ATORVASTATIN CALCIUM 10 MG PO TABS: ORAL_TABLET | ORAL | 3 refills | Status: DC

## 2018-10-16 NOTE — Patient Instructions (Signed)
Medication Instructions:  Continue same medications If you need a refill on your cardiac medications before your next appointment, please call your pharmacy.   Lab work: None If you have labs (blood work) drawn today and your tests are completely normal, you will receive your results only by: . MyChart Message (if you have MyChart) OR . A paper copy in the mail If you have any lab test that is abnormal or we need to change your treatment, we will call you to review the results.  Testing/Procedures: None  Follow-Up: At [facility], you and your health needs are our priority.  As part of our continuing mission to provide you with exceptional heart care, we have created designated Provider Care Teams.  These Care Teams include your primary Cardiologist (physician) and Advanced Practice Providers (APPs -  Physician Assistants and Nurse Practitioners) who all work together to provide you with the care you need, when you need it. You will need a follow up appointment in 6 months.  Please call our office 2 months in advance to schedule this appointment.  You may see Keaten Mashek, MD or one of the following Advanced Practice Providers on your designated Care Team:   Christopher Berge, NP Ryan Dunn, PA-C . Jacquelyn Visser, PA-C   

## 2018-10-16 NOTE — Progress Notes (Signed)
Virtual Visit via Telephone Note   This visit type was conducted due to national recommendations for restrictions regarding the COVID-19 Pandemic (e.g. social distancing) in an effort to limit this patient's exposure and mitigate transmission in our community.  Due to his co-morbid illnesses, this patient is at least at moderate risk for complications without adequate follow up.  This format is felt to be most appropriate for this patient at this time.  The patient did not have access to video technology/had technical difficulties with video requiring transitioning to audio format only (telephone).  All issues noted in this document were discussed and addressed.  No physical exam could be performed with this format.  Please refer to the patient's chart for his  consent to telehealth for [facility].   Evaluation Performed:  Follow-up visit  Date:  10/16/2018   ID:  Oscar, Hur Dec Mills, Oscar Mills, MRN [id number]  Patient Location: Other:  work Provider Location: Office  PCP:  Susy Frizzle, MD  Cardiologist:  [name], MD  Electrophysiologist:  None   Chief Complaint: Follow-up visit  History of Present Illness:    NEVAN CREIGHTON is a 68 y.o. male was reached today via phone for follow-up visit regarding peripheral arterial disease. He is a smoker with hypertension and a family history of heart disease. He has known occlusion of the right SFA and proximal popliteal artery with right calf claudication. Noninvasive vascular evaluation in June 2015 indicated an ABI of 0.44 on the right and 1.2 on the left.  He was treated medically with cilostazol with improvement in symptoms.  He has been doing well with no recent chest pain, shortness of breath or palpitations.  He reports stable right calf claudication which happens after walking about 200 to 300 yards.  He has no rest pain.   The patient does not have symptoms concerning for COVID-19 infection (fever, chills,  cough, or new shortness of breath).    Past Medical History:  Diagnosis Date  . Arthritis    NECK  . Diabetes mellitus, type II ([place])    DIET CONTROLLED-NO MEDS  . Dysfunctional alcohol use    now quit  . GERD (gastroesophageal reflux disease)   . Hypertension   . Pancreatitis   . Peripheral vascular disease ([place])   . Stress fracture of ankle    Past Surgical History:  Procedure Laterality Date  . LOWER EXTREMITY ANGIOGRAM Bilateral 02/24/2014   Procedure: LOWER EXTREMITY ANGIOGRAM;  Surgeon: Wellington Hampshire, MD;  Location: [place] CATH LAB;  Service: Cardiovascular;  Laterality: Bilateral;  . PERIPHERAL VASCULAR CATHETERIZATION    . UMBILICAL HERNIA REPAIR N/A 03/05/2017   Procedure: HERNIA REPAIR UMBILICAL ADULT;  Surgeon: Leonie Green, MD;  Location: [facility] ORS;  Service: General;  Laterality: N/A;     Current Meds  Medication Sig  . Acetaminophen (TYLENOL PO) Take by mouth as needed.  Marland Kitchen amLODipine (NORVASC) 10 MG tablet TAKE 1 TABLET(10 MG) BY MOUTH DAILY  . aspirin 81 MG tablet Take 81 mg by mouth daily.  Marland Kitchen atorvastatin (LIPITOR) 10 MG tablet TAKE 1 TABLET BY MOUTH EVERY DAY  . Blood Glucose Monitoring Suppl (ACCU-CHEK AVIVA PLUS) w/Device KIT Check BS bid  . cilostazol (PLETAL) 100 MG tablet TAKE 1 TABLET(100 MG) BY MOUTH TWICE DAILY  . diclofenac sodium (VOLTAREN) 1 % GEL APP UP TO 4 GRAMS EXT AA TID PRN  . gabapentin (NEURONTIN) 300 MG capsule Take 300 mg daily by mouth. Take nightly  . gentamicin  cream (GARAMYCIN) 0.1 % Apply 1 application topically 3 (three) times daily.  Marland Kitchen glucose blood (ACCU-CHEK AVIVA PLUS) test strip Check BS BID - new onset dm E11.9  . Lancets (ACCU-CHEK MULTICLIX) lancets Check BS bid - New onset DM E11.9  . pantoprazole (PROTONIX) 40 MG tablet TAKE 1 TABLET(40 MG) BY MOUTH DAILY     Allergies:   Iohexol   Social History   Tobacco Use  . Smoking status: Current Every Day Smoker    Packs/day: 0.25    Years: 30.00    Pack years: 7.50     Types: Cigarettes  . Smokeless tobacco: Never Used  Substance Use Topics  . Alcohol use: No  . Drug use: No     Family Hx: The patient's family history includes Heart attack in his father.  ROS:   Please see the history of present illness.     All other systems reviewed and are negative.   Prior CV studies:   The following studies were reviewed today:    Labs/Other Tests and Data Reviewed:    EKG:  No ECG reviewed.  Recent Labs: No results found for requested labs within last 8760 hours.   Recent Lipid Panel Lab Results  Component Value Date/Time   CHOL 84 03/01/2016 09:49 AM   CHOL 94 (L) 05/14/2014 08:Mills AM   TRIG 71 03/01/2016 09:49 AM   HDL 30 (L) 03/01/2016 09:49 AM   HDL 38 (L) 05/14/2014 08:Mills AM   CHOLHDL 2.8 03/01/2016 09:49 AM   LDLCALC 40 03/01/2016 09:49 AM   LDLCALC 36 05/14/2014 08:Mills AM    Wt Readings from Last 3 Encounters:  10/16/18 180 lb (81.6 kg)  11/22/17 180 lb 12 oz (82 kg)  06/10/17 185 lb 12 oz (84.3 kg)     Objective:    Vital Signs:  Pulse 62   Ht 6' 2"  (1.88 m)   Wt 180 lb (81.6 kg)   BMI 23.11 kg/m      ASSESSMENT & PLAN:    1.  Peripheral arterial disease: The patient has stable mild right calf claudication due to known SFA occlusion. Symptoms are overall stable on cilostazol.  2. Essential hypertension: Currently on amlodipine which will be refilled.  3. Hyperlipidemia: Continue treatment with atorvastatin with a target LDL of less than 70.  4. Tobacco use: I again discussed the importance of smoking cessation.   COVID-19 Education: The signs and symptoms of COVID-19 were discussed with the patient and how to seek care for testing (follow up with PCP or arrange E-visit).  The importance of social distancing was discussed today.  Time:   Today, I have spent 15 minutes with the patient with telehealth technology discussing the above problems.     Medication Adjustments/Labs and Tests Ordered: Current  medicines are reviewed at length with the patient today.  Concerns regarding medicines are outlined above.   Tests Ordered: No orders of the defined types were placed in this encounter.   Medication Changes: No orders of the defined types were placed in this encounter.   Disposition:  Follow up in 6 month(s)  Signed, [name], MD  10/16/2018 9:03 AM    [facility]

## 2018-10-30 DIAGNOSIS — G2581 Restless legs syndrome: Secondary | ICD-10-CM | POA: Diagnosis not present

## 2018-10-30 DIAGNOSIS — M79671 Pain in right foot: Secondary | ICD-10-CM | POA: Diagnosis not present

## 2018-10-30 DIAGNOSIS — G603 Idiopathic progressive neuropathy: Secondary | ICD-10-CM | POA: Diagnosis not present

## 2018-10-30 DIAGNOSIS — M545 Low back pain: Secondary | ICD-10-CM | POA: Diagnosis not present

## 2018-12-25 DIAGNOSIS — M5441 Lumbago with sciatica, right side: Secondary | ICD-10-CM | POA: Diagnosis not present

## 2018-12-25 DIAGNOSIS — G603 Idiopathic progressive neuropathy: Secondary | ICD-10-CM | POA: Diagnosis not present

## 2018-12-25 DIAGNOSIS — G2581 Restless legs syndrome: Secondary | ICD-10-CM | POA: Diagnosis not present

## 2018-12-25 DIAGNOSIS — M79672 Pain in left foot: Secondary | ICD-10-CM | POA: Diagnosis not present

## 2018-12-25 DIAGNOSIS — M5442 Lumbago with sciatica, left side: Secondary | ICD-10-CM | POA: Diagnosis not present

## 2018-12-25 DIAGNOSIS — M79671 Pain in right foot: Secondary | ICD-10-CM | POA: Diagnosis not present

## 2019-01-02 DIAGNOSIS — H2513 Age-related nuclear cataract, bilateral: Secondary | ICD-10-CM | POA: Diagnosis not present

## 2019-03-05 DIAGNOSIS — G2581 Restless legs syndrome: Secondary | ICD-10-CM | POA: Diagnosis not present

## 2019-03-05 DIAGNOSIS — M545 Low back pain: Secondary | ICD-10-CM | POA: Diagnosis not present

## 2019-03-05 DIAGNOSIS — M79671 Pain in right foot: Secondary | ICD-10-CM | POA: Diagnosis not present

## 2019-04-09 DIAGNOSIS — L570 Actinic keratosis: Secondary | ICD-10-CM | POA: Diagnosis not present

## 2019-04-09 DIAGNOSIS — X32XXXD Exposure to sunlight, subsequent encounter: Secondary | ICD-10-CM | POA: Diagnosis not present

## 2019-04-09 DIAGNOSIS — C44629 Squamous cell carcinoma of skin of left upper limb, including shoulder: Secondary | ICD-10-CM | POA: Diagnosis not present

## 2019-04-30 DIAGNOSIS — G5603 Carpal tunnel syndrome, bilateral upper limbs: Secondary | ICD-10-CM | POA: Diagnosis not present

## 2019-04-30 DIAGNOSIS — Z79899 Other long term (current) drug therapy: Secondary | ICD-10-CM | POA: Diagnosis not present

## 2019-04-30 DIAGNOSIS — G5623 Lesion of ulnar nerve, bilateral upper limbs: Secondary | ICD-10-CM | POA: Diagnosis not present

## 2019-04-30 DIAGNOSIS — M5412 Radiculopathy, cervical region: Secondary | ICD-10-CM | POA: Diagnosis not present

## 2019-04-30 DIAGNOSIS — G2581 Restless legs syndrome: Secondary | ICD-10-CM | POA: Diagnosis not present

## 2019-04-30 DIAGNOSIS — M5417 Radiculopathy, lumbosacral region: Secondary | ICD-10-CM | POA: Diagnosis not present

## 2019-04-30 DIAGNOSIS — M79671 Pain in right foot: Secondary | ICD-10-CM | POA: Diagnosis not present

## 2019-04-30 DIAGNOSIS — G603 Idiopathic progressive neuropathy: Secondary | ICD-10-CM | POA: Diagnosis not present

## 2019-07-09 DIAGNOSIS — M545 Low back pain: Secondary | ICD-10-CM | POA: Diagnosis not present

## 2019-07-09 DIAGNOSIS — Z79899 Other long term (current) drug therapy: Secondary | ICD-10-CM | POA: Diagnosis not present

## 2019-07-09 DIAGNOSIS — G2581 Restless legs syndrome: Secondary | ICD-10-CM | POA: Diagnosis not present

## 2019-07-09 DIAGNOSIS — G603 Idiopathic progressive neuropathy: Secondary | ICD-10-CM | POA: Diagnosis not present

## 2019-07-10 ENCOUNTER — Ambulatory Visit: Payer: Medicare HMO | Admitting: Cardiovascular Disease

## 2019-07-14 DIAGNOSIS — H25042 Posterior subcapsular polar age-related cataract, left eye: Secondary | ICD-10-CM | POA: Diagnosis not present

## 2019-08-14 ENCOUNTER — Ambulatory Visit: Payer: Medicare HMO | Admitting: Cardiovascular Disease

## 2019-08-14 DIAGNOSIS — Z131 Encounter for screening for diabetes mellitus: Secondary | ICD-10-CM | POA: Diagnosis not present

## 2019-08-17 ENCOUNTER — Encounter: Payer: Self-pay | Admitting: Cardiovascular Disease

## 2019-08-21 DIAGNOSIS — I10 Essential (primary) hypertension: Secondary | ICD-10-CM | POA: Diagnosis not present

## 2019-08-21 DIAGNOSIS — H25042 Posterior subcapsular polar age-related cataract, left eye: Secondary | ICD-10-CM | POA: Diagnosis not present

## 2019-08-27 ENCOUNTER — Encounter: Payer: Self-pay | Admitting: Ophthalmology

## 2019-08-27 ENCOUNTER — Other Ambulatory Visit: Payer: Self-pay

## 2019-09-01 ENCOUNTER — Other Ambulatory Visit: Payer: Self-pay

## 2019-09-01 ENCOUNTER — Other Ambulatory Visit
Admission: RE | Admit: 2019-09-01 | Discharge: 2019-09-01 | Disposition: A | Discharge status: Home / Self Care | Payer: Medicare HMO | Source: Ambulatory Visit | Attending: Ophthalmology | Admitting: Ophthalmology

## 2019-09-01 DIAGNOSIS — Z20822 Contact with and (suspected) exposure to covid-19: Secondary | ICD-10-CM | POA: Insufficient documentation

## 2019-09-01 DIAGNOSIS — Z01812 Encounter for preprocedural laboratory examination: Secondary | ICD-10-CM | POA: Insufficient documentation

## 2019-09-01 LAB — SARS CORONAVIRUS 2 (TAT 6-24 HRS): SARS Coronavirus 2: NEGATIVE

## 2019-09-01 NOTE — Discharge Instructions (Signed)

## 2019-09-03 ENCOUNTER — Ambulatory Visit: Payer: Medicare HMO | Admitting: Anesthesiology

## 2019-09-03 ENCOUNTER — Other Ambulatory Visit: Payer: Self-pay

## 2019-09-03 ENCOUNTER — Ambulatory Visit
Admission: RE | Admit: 2019-09-03 | Discharge: 2019-09-03 | Disposition: A | Discharge status: Home / Self Care | Payer: Medicare HMO | Attending: Ophthalmology | Admitting: Ophthalmology

## 2019-09-03 ENCOUNTER — Encounter
Admission: RE | Disposition: A | Discharge status: Home / Self Care | Payer: Self-pay | Source: Home / Self Care | Attending: Ophthalmology

## 2019-09-03 DIAGNOSIS — E1136 Type 2 diabetes mellitus with diabetic cataract: Secondary | ICD-10-CM | POA: Insufficient documentation

## 2019-09-03 DIAGNOSIS — H2512 Age-related nuclear cataract, left eye: Secondary | ICD-10-CM | POA: Diagnosis not present

## 2019-09-03 DIAGNOSIS — K219 Gastro-esophageal reflux disease without esophagitis: Secondary | ICD-10-CM | POA: Diagnosis not present

## 2019-09-03 DIAGNOSIS — H25042 Posterior subcapsular polar age-related cataract, left eye: Secondary | ICD-10-CM | POA: Diagnosis not present

## 2019-09-03 DIAGNOSIS — M1990 Unspecified osteoarthritis, unspecified site: Secondary | ICD-10-CM | POA: Diagnosis not present

## 2019-09-03 DIAGNOSIS — E1151 Type 2 diabetes mellitus with diabetic peripheral angiopathy without gangrene: Secondary | ICD-10-CM | POA: Insufficient documentation

## 2019-09-03 DIAGNOSIS — E1140 Type 2 diabetes mellitus with diabetic neuropathy, unspecified: Secondary | ICD-10-CM | POA: Insufficient documentation

## 2019-09-03 DIAGNOSIS — F17200 Nicotine dependence, unspecified, uncomplicated: Secondary | ICD-10-CM | POA: Insufficient documentation

## 2019-09-03 DIAGNOSIS — Z91041 Radiographic dye allergy status: Secondary | ICD-10-CM | POA: Diagnosis not present

## 2019-09-03 DIAGNOSIS — E785 Hyperlipidemia, unspecified: Secondary | ICD-10-CM | POA: Insufficient documentation

## 2019-09-03 DIAGNOSIS — E7800 Pure hypercholesterolemia, unspecified: Secondary | ICD-10-CM | POA: Diagnosis not present

## 2019-09-03 DIAGNOSIS — H25812 Combined forms of age-related cataract, left eye: Secondary | ICD-10-CM | POA: Diagnosis not present

## 2019-09-03 DIAGNOSIS — I10 Essential (primary) hypertension: Secondary | ICD-10-CM | POA: Diagnosis not present

## 2019-09-03 HISTORY — PX: CATARACT EXTRACTION W/PHACO: SHX586

## 2019-09-03 HISTORY — DX: Polyneuropathy, unspecified: G62.9

## 2019-09-03 SURGERY — PHACOEMULSIFICATION, CATARACT, WITH IOL INSERTION
Anesthesia: Monitor Anesthesia Care | Site: Eye | Laterality: Left

## 2019-09-03 MED ORDER — NA CHONDROIT SULF-NA HYALURON 40-17 MG/ML IO SOLN
INTRAOCULAR | Status: DC | PRN
  Administered 2019-09-03: 1 mL via INTRAOCULAR

## 2019-09-03 MED ORDER — MIDAZOLAM HCL 2 MG/2ML IJ SOLN
INTRAMUSCULAR | Status: DC | PRN
  Administered 2019-09-03: 2 mg via INTRAVENOUS

## 2019-09-03 MED ORDER — TETRACAINE 0.5 % OP SOLN OPTIME - NO CHARGE
OPHTHALMIC | Status: DC | PRN
  Administered 2019-09-03: 2 [drp] via OPHTHALMIC

## 2019-09-03 MED ORDER — ARMC OPHTHALMIC DILATING DROPS
1.0000 "application " | OPHTHALMIC | Status: DC | PRN
  Administered 2019-09-03 (×3): 1 via OPHTHALMIC

## 2019-09-03 MED ORDER — TETRACAINE HCL 0.5 % OP SOLN
1.0000 [drp] | OPHTHALMIC | Status: DC | PRN
  Administered 2019-09-03 (×3): 1 [drp] via OPHTHALMIC

## 2019-09-03 MED ORDER — MOXIFLOXACIN HCL 0.5 % OP SOLN
OPHTHALMIC | Status: DC | PRN
  Administered 2019-09-03: 0.2 mL via OPHTHALMIC

## 2019-09-03 MED ORDER — LIDOCAINE HCL (PF) 2 % IJ SOLN
INTRAOCULAR | Status: DC | PRN
  Administered 2019-09-03: 2 mL via INTRAOCULAR

## 2019-09-03 MED ORDER — ONDANSETRON HCL 4 MG/2ML IJ SOLN: 4.0000 mg | Freq: Once | INTRAMUSCULAR | Status: DC | PRN

## 2019-09-03 MED ORDER — EPINEPHRINE PF 1 MG/ML IJ SOLN
INTRAOCULAR | Status: DC | PRN
  Administered 2019-09-03: 56 mL via OPHTHALMIC

## 2019-09-03 MED ORDER — FENTANYL CITRATE (PF) 100 MCG/2ML IJ SOLN
INTRAMUSCULAR | Status: DC | PRN
  Administered 2019-09-03: 100 ug via INTRAVENOUS

## 2019-09-03 MED ORDER — LACTATED RINGERS IV SOLN: 100.0000 mL/h | INTRAVENOUS | Status: DC

## 2019-09-03 MED ORDER — PROVISC 10 MG/ML IO SOLN
INTRAOCULAR | Status: DC | PRN
  Administered 2019-09-03: 0.55 mL via INTRAOCULAR

## 2019-09-03 MED ORDER — TRYPAN BLUE 0.06 % OP SOLN
OPHTHALMIC | Status: DC | PRN
  Administered 2019-09-03: 0.5 mL via INTRAOCULAR

## 2019-09-03 MED ORDER — BRIMONIDINE TARTRATE-TIMOLOL 0.2-0.5 % OP SOLN
OPHTHALMIC | Status: DC | PRN
  Administered 2019-09-03: 1 [drp] via OPHTHALMIC

## 2019-09-03 SURGICAL SUPPLY — 16 items
DISSECTOR HYDRO NUCLEUS 50X22 (MISCELLANEOUS) ×8 IMPLANT
DRSG TEGADERM 2-3/8X2-3/4 SM (GAUZE/BANDAGES/DRESSINGS) ×2 IMPLANT
GLOVE BIOGEL PI IND STRL 8 (GLOVE) ×1 IMPLANT
GLOVE BIOGEL PI INDICATOR 8 (GLOVE) ×1
GOWN STRL REUS W/ TWL LRG LVL3 (GOWN DISPOSABLE) ×1 IMPLANT
GOWN STRL REUS W/ TWL XL LVL3 (GOWN DISPOSABLE) ×1 IMPLANT
GOWN STRL REUS W/TWL LRG LVL3 (GOWN DISPOSABLE) ×1
GOWN STRL REUS W/TWL XL LVL3 (GOWN DISPOSABLE) ×1
KNIFE 45D UP 2.3 (MISCELLANEOUS) ×2 IMPLANT
LENS IOL TECNIS ITEC 20.5 (Intraocular Lens) ×1 IMPLANT
PACK CATARACT (MISCELLANEOUS) ×2 IMPLANT
PACK DR. KING ARMS (PACKS) ×2 IMPLANT
PACK EYE AFTER SURG (MISCELLANEOUS) ×2 IMPLANT
SOLUTION OPHTHALMIC SALT (MISCELLANEOUS) ×2 IMPLANT
WATER STERILE IRR 250ML POUR (IV SOLUTION) ×2 IMPLANT
WIPE NON LINTING 3.25X3.25 (MISCELLANEOUS) ×2 IMPLANT

## 2019-09-03 NOTE — Anesthesia Preprocedure Evaluation (Signed)
Anesthesia Evaluation  Patient identified by MRN, date of birth, ID band Patient awake    Reviewed: Allergy & Precautions, NPO status   Airway Mallampati: II  TM Distance: >3 FB     Dental   Pulmonary Current SmokerPatient did not abstain from smoking.,    breath sounds clear to auscultation       Cardiovascular hypertension,  Rhythm:Regular Rate:Normal  HLD   Neuro/Psych Hx alcohol abuse   GI/Hepatic GERD  ,Hx pancreatitis   Endo/Other  diabetes (2 - diet controlled)  Renal/GU      Musculoskeletal  (+) Arthritis ,   Abdominal   Peds  Hematology   Anesthesia Other Findings   Reproductive/Obstetrics                             Anesthesia Physical Anesthesia Plan  ASA: III  Anesthesia Plan: MAC   Post-op Pain Management:    Induction: Intravenous  PONV Risk Score and Plan: TIVA, Midazolam and Treatment may vary due to age or medical condition  Airway Management Planned: Natural Airway and Nasal Cannula  Additional Equipment:   Intra-op Plan:   Post-operative Plan:   Informed Consent: I have reviewed the patients History and Physical, chart, labs and discussed the procedure including the risks, benefits and alternatives for the proposed anesthesia with the patient or authorized representative who has indicated his/her understanding and acceptance.       Plan Discussed with: CRNA  Anesthesia Plan Comments:         Anesthesia Quick Evaluation

## 2019-09-03 NOTE — Anesthesia Procedure Notes (Signed)
Procedure Name: MAC Performed by: Wafa Martes M, CRNA Pre-anesthesia Checklist: Timeout performed, Patient being monitored, Suction available, Emergency Drugs available and Patient identified Patient Re-evaluated:Patient Re-evaluated prior to induction Oxygen Delivery Method: Nasal cannula       

## 2019-09-03 NOTE — H&P (Signed)
   I have reviewed the patient's H&P and agree with its findings. There have been no interval changes.  Jaheem Hedgepath MD Ophthalmology 

## 2019-09-03 NOTE — Op Note (Signed)
  PREOPERATIVE DIAGNOSIS:  Nuclear sclerotic cataract of the LEFT eye.   POSTOPERATIVE DIAGNOSIS:  Nuclear sclerotic cataract of the LEFT eye.   OPERATIVE PROCEDURE: Cataract surgery OS   SURGEON:  Marchia Meiers, MD.   ANESTHESIA:  Anesthesiologist: Veda Canning, MD CRNA: Izetta Dakin, CRNA  1.      Managed anesthesia care. 2.     0.60ml of Shugarcaine was instilled following the paracentesis   COMPLICATIONS:  None.   TECHNIQUE:   Divide and conquer   DESCRIPTION OF PROCEDURE:  The patient was examined and consented in the preoperative holding area where the aforementioned topical anesthesia was applied to the LEFT eye and then brought back to the Operating Room where the left eye was prepped and draped in the usual sterile ophthalmic fashion and a lid speculum was placed. A paracentesis was created with the side port blade, the anterior chamber was washed out with trypan blue to stain the anterior capsule, and the anterior chamber was filled with viscoelastic. A near clear corneal incision was performed with the steel keratome. A continuous curvilinear capsulorrhexis was performed with a cystotome followed by the capsulorrhexis forceps. Hydrodissection and hydrodelineation were carried out with BSS on a blunt cannula. The lens was removed in a divide and conquer  technique and the remaining cortical material was removed with the irrigation-aspiration handpiece. The capsular bag was inflated with viscoelastic and the lens was placed in the capsular bag without complication. The remaining viscoelastic was removed from the eye with the irrigation-aspiration handpiece. The wounds were hydrated. The anterior chamber was flushed and the eye was inflated to physiologic pressure. 0.16ml Vigamox was placed in the anterior chamber. The wounds were found to be water tight. The eye was dressed with Vigamox. The patient was given protective glasses to wear throughout the day and a shield with which to  sleep tonight. The patient was also given drops with which to begin a drop regimen today and will follow-up with me in one day. Implant Name Type Inv. Item Serial No. Manufacturer Lot No. LRB No. Used Action  LENS IOL DIOP 20.5 - [id number] Intraocular Lens LENS IOL DIOP 20.5 [id number]  Left 1 Implanted    Procedure(s) with comments: CATARACT EXTRACTION PHACO AND INTRAOCULAR LENS PLACEMENT (IOC) LEFT VISION BLUE 9.84 00:54.9 17.9% (Left) - Diabetic - diet controlled  Electronically signed: Kavaughn Faucett 09/03/2019 8:56 AM

## 2019-09-03 NOTE — Anesthesia Postprocedure Evaluation (Signed)
Anesthesia Post Note  [name]  Procedure(s) Performed: CATARACT EXTRACTION PHACO AND INTRAOCULAR LENS PLACEMENT (IOC) LEFT VISION BLUE 9.84 00:54.9 17.9% (Left Eye)     Patient location during evaluation: PACU Anesthesia Type: MAC Level of consciousness: awake Pain management: pain level controlled Vital Signs Assessment: post-procedure vital signs reviewed and stable Respiratory status: respiratory function stable Cardiovascular status: stable Postop Assessment: no apparent nausea or vomiting Anesthetic complications: no    Veda Canning

## 2019-09-03 NOTE — Transfer of Care (Signed)
Immediate Anesthesia Transfer of Care Note  [name]  Procedure(s) Performed: CATARACT EXTRACTION PHACO AND INTRAOCULAR LENS PLACEMENT (IOC) LEFT VISION BLUE 9.84 00:54.9 17.9% (Left Eye)  Patient Location: PACU  Anesthesia Type: MAC  Level of Consciousness: awake, alert  and patient cooperative  Airway and Oxygen Therapy: Patient Spontanous Breathing and Patient connected to supplemental oxygen  Post-op Assessment: Post-op Vital signs reviewed, Patient's Cardiovascular Status Stable, Respiratory Function Stable, Patent Airway and No signs of Nausea or vomiting  Post-op Vital Signs: Reviewed and stable  Complications: No apparent anesthesia complications

## 2019-09-04 ENCOUNTER — Encounter: Payer: Self-pay | Admitting: *Deleted

## 2019-09-25 NOTE — Progress Notes (Deleted)
Cardiology Office Note    Date:  09/25/2019   ID:  Oscar Mills, Oscar Mills 08/11/1950, MRN [id number]  PCP:  Susy Frizzle, MD  Cardiologist:  [name], MD  Electrophysiologist:  None   Chief Complaint: Follow up  History of Present Illness:   Oscar Mills is a 69 y.o. male with history of PAD, HTN, family history of heart disease, and tobacco use who presents for follow-up of PAD.  He has known occlusion of the right SFA and proximal popliteal artery with right calf claudication.  Noninvasive vascular evaluation in 11/2013 showed an ABI of 0.44 on the right and 1.16 on the left.  He was managed medically with Pletal with improvement of symptoms.  Most recent lower extremity ABI from 04/2017 showed a right-sided ABI of 0.5 and left-sided ABI of 1.2.  Noninvasive lower extremity imaging from 08/2017 showed mild to moderate iliac disease.  He was most recently seen virtually in 09/2018 and was doing well with stable right calf claudication which occurred after walking approximately 200 to 300 yards.  He had no rest pain.  There was no chest pain, shortness of breath, or palpitations.  ***   Labs independently reviewed: ***  Past Medical History:  Diagnosis Date  . Arthritis    NECK  . Diabetes mellitus, type II ([place])    DIET CONTROLLED-NO MEDS  . Dysfunctional alcohol use    now quit  . GERD (gastroesophageal reflux disease)   . Hypertension   . Neuropathy    feet  . Pancreatitis   . Peripheral vascular disease ([place])   . Stress fracture of ankle     Past Surgical History:  Procedure Laterality Date  . CATARACT EXTRACTION W/PHACO Left 09/03/2019   Procedure: CATARACT EXTRACTION PHACO AND INTRAOCULAR LENS PLACEMENT (IOC) LEFT VISION BLUE 9.84 00:54.9 17.9%;  Surgeon: Marchia Meiers, MD;  Location: [place];  Service: Ophthalmology;  Laterality: Left;  Diabetic - diet controlled  . LOWER EXTREMITY ANGIOGRAM Bilateral 02/24/2014   Procedure: LOWER EXTREMITY  ANGIOGRAM;  Surgeon: Wellington Hampshire, MD;  Location: [place] CATH LAB;  Service: Cardiovascular;  Laterality: Bilateral;  . PERIPHERAL VASCULAR CATHETERIZATION    . UMBILICAL HERNIA REPAIR N/A 03/05/2017   Procedure: HERNIA REPAIR UMBILICAL ADULT;  Surgeon: Leonie Green, MD;  Location: [facility] ORS;  Service: General;  Laterality: N/A;    Current Medications: No outpatient medications have been marked as taking for the 09/29/19 encounter (Appointment) with Rise Mu, PA-C.    Allergies:   Iohexol   Social History   Socioeconomic History  . Marital status: Married    Spouse name: Not on file  . Number of children: 2  . Years of education: 27  . Highest education level: Not on file  Occupational History  . Occupation: self-employed  Tobacco Use  . Smoking status: Current Every Day Smoker    Packs/day: 0.50    Years: 40.00    Pack years: 20.00    Types: Cigarettes  . Smokeless tobacco: Never Used  Substance and Sexual Activity  . Alcohol use: Not Currently    Comment: quit around 2012  . Drug use: No  . Sexual activity: Not on file  Other Topics Concern  . Not on file  Social History Narrative  . Not on file   Social Determinants of Health   Financial Resource Strain:   . Difficulty of Paying Living Expenses:   Food Insecurity:   . Worried About [employer] of  Food in the Last Year:   . [place] in the Last Year:   Transportation Needs:   . Film/video editor (Medical):   Marland Kitchen Lack of Transportation (Non-Medical):   Physical Activity:   . Days of Exercise per Week:   . Minutes of Exercise per Session:   Stress:   . Feeling of Stress :   Social Connections:   . Frequency of Communication with Friends and Family:   . Frequency of Social Gatherings with Friends and Family:   . Attends Religious Services:   . Active Member of Clubs or Organizations:   . Attends Archivist Meetings:   Marland Kitchen Marital Status:      Family History:  The patient's  family history includes Heart attack in his father.  ROS:   ROS   EKGs/Labs/Other Studies Reviewed:    Studies reviewed were summarized above. The additional studies were reviewed today: As above.  EKG:  EKG is ordered today.  The EKG ordered today demonstrates ***  Recent Labs: No results found for requested labs within last 8760 hours.  Recent Lipid Panel    Component Value Date/Time   CHOL 84 03/01/2016 0949   CHOL 94 (L) 05/14/2014 0829   TRIG 71 03/01/2016 0949   HDL 30 (L) 03/01/2016 0949   HDL 38 (L) 05/14/2014 0829   CHOLHDL 2.8 03/01/2016 0949   VLDL 14 03/01/2016 0949   LDLCALC 40 03/01/2016 0949   LDLCALC 36 05/14/2014 0829    PHYSICAL EXAM:    VS:  There were no vitals taken for this visit.  BMI: There is no height or weight on file to calculate BMI.  Physical Exam  Wt Readings from Last 3 Encounters:  09/03/19 167 lb (75.8 kg)  10/16/18 180 lb (81.6 kg)  11/22/17 180 lb 12 oz (82 kg)     ASSESSMENT & PLAN:   1. ***  Disposition: F/u with Dr. Fletcher Anon or an APP in ***.   Medication Adjustments/Labs and Tests Ordered: Current medicines are reviewed at length with the patient today.  Concerns regarding medicines are outlined above. Medication changes, Labs and Tests ordered today are summarized above and listed in the Patient Instructions accessible in Encounters.   Signed, Christell Faith, PA-C 09/25/2019 1:49 PM     [address] [place], [place] [address] [phone]

## 2019-09-29 ENCOUNTER — Ambulatory Visit: Payer: Medicare HMO | Admitting: Physician Assistant

## 2019-10-02 ENCOUNTER — Ambulatory Visit: Payer: Medicare HMO | Admitting: Nurse Practitioner

## 2019-10-15 DIAGNOSIS — Z79899 Other long term (current) drug therapy: Secondary | ICD-10-CM | POA: Diagnosis not present

## 2019-10-15 DIAGNOSIS — M79671 Pain in right foot: Secondary | ICD-10-CM | POA: Diagnosis not present

## 2019-10-15 DIAGNOSIS — G2581 Restless legs syndrome: Secondary | ICD-10-CM | POA: Diagnosis not present

## 2019-10-15 DIAGNOSIS — M545 Low back pain: Secondary | ICD-10-CM | POA: Diagnosis not present

## 2019-10-15 DIAGNOSIS — G603 Idiopathic progressive neuropathy: Secondary | ICD-10-CM | POA: Diagnosis not present

## 2019-10-22 ENCOUNTER — Other Ambulatory Visit: Payer: Self-pay

## 2019-10-22 ENCOUNTER — Ambulatory Visit (INDEPENDENT_AMBULATORY_CARE_PROVIDER_SITE_OTHER): Payer: Medicare HMO | Admitting: Cardiovascular Disease

## 2019-10-22 ENCOUNTER — Encounter: Payer: Self-pay | Admitting: Cardiovascular Disease

## 2019-10-22 ENCOUNTER — Other Ambulatory Visit
Admission: RE | Admit: 2019-10-22 | Discharge: 2019-10-22 | Disposition: A | Discharge status: Home / Self Care | Payer: Medicare HMO | Source: Ambulatory Visit | Attending: Cardiovascular Disease | Admitting: Cardiovascular Disease

## 2019-10-22 VITALS — BP 140/60 | HR 72 | Ht 74.0 in | Wt 161.5 lb

## 2019-10-22 DIAGNOSIS — I10 Essential (primary) hypertension: Secondary | ICD-10-CM | POA: Insufficient documentation

## 2019-10-22 DIAGNOSIS — Z720 Tobacco use: Secondary | ICD-10-CM

## 2019-10-22 DIAGNOSIS — R739 Hyperglycemia, unspecified: Secondary | ICD-10-CM | POA: Insufficient documentation

## 2019-10-22 DIAGNOSIS — E785 Hyperlipidemia, unspecified: Secondary | ICD-10-CM

## 2019-10-22 DIAGNOSIS — I739 Peripheral vascular disease, unspecified: Secondary | ICD-10-CM | POA: Diagnosis not present

## 2019-10-22 LAB — CBC WITH DIFFERENTIAL/PLATELET
Abs Immature Granulocytes: 0.08 10*3/uL — ABNORMAL HIGH (ref 0.00–0.07)
Basophils Absolute: 0.1 10*3/uL (ref 0.0–0.1)
Basophils Relative: 0 %
Eosinophils Absolute: 0.1 10*3/uL (ref 0.0–0.5)
Eosinophils Relative: 1 %
HCT: 48.1 % (ref 39.0–52.0)
Hemoglobin: 16.4 g/dL (ref 13.0–17.0)
Immature Granulocytes: 1 %
Lymphocytes Relative: 24 %
Lymphs Abs: 3.9 10*3/uL (ref 0.7–4.0)
MCH: 31 pg (ref 26.0–34.0)
MCHC: 34.1 g/dL (ref 30.0–36.0)
MCV: 90.9 fL (ref 80.0–100.0)
Monocytes Absolute: 1.1 10*3/uL — ABNORMAL HIGH (ref 0.1–1.0)
Monocytes Relative: 7 %
Neutro Abs: 11.2 10*3/uL — ABNORMAL HIGH (ref 1.7–7.7)
Neutrophils Relative %: 67 %
Platelets: 266 10*3/uL (ref 150–400)
RBC: 5.29 MIL/uL (ref 4.22–5.81)
RDW: 13.2 % (ref 11.5–15.5)
WBC: 16.4 10*3/uL — ABNORMAL HIGH (ref 4.0–10.5)
nRBC: 0 % (ref 0.0–0.2)

## 2019-10-22 LAB — COMPREHENSIVE METABOLIC PANEL
ALT: 22 U/L (ref 0–44)
AST: 13 U/L — ABNORMAL LOW (ref 15–41)
Albumin: 4.3 g/dL (ref 3.5–5.0)
Alkaline Phosphatase: 73 U/L (ref 38–126)
Anion gap: 6 (ref 5–15)
BUN: 26 mg/dL — ABNORMAL HIGH (ref 8–23)
CO2: 23 mmol/L (ref 22–32)
Calcium: 9.8 mg/dL (ref 8.9–10.3)
Chloride: 105 mmol/L (ref 98–111)
Creatinine, Ser: 0.65 mg/dL (ref 0.61–1.24)
GFR calc Af Amer: >60 mL/min (ref >60)
GFR calc non Af Amer: >60 mL/min (ref >60)
Glucose, Bld: 321 mg/dL — ABNORMAL HIGH (ref 70–99)
Potassium: 4.4 mmol/L (ref 3.5–5.1)
Sodium: 134 mmol/L — ABNORMAL LOW (ref 135–145)
Total Bilirubin: 0.7 mg/dL (ref 0.3–1.2)
Total Protein: 7.1 g/dL (ref 6.5–8.1)

## 2019-10-22 LAB — HEMOGLOBIN A1C
Hgb A1c MFr Bld: 12 % — ABNORMAL HIGH (ref 4.8–5.6)
Mean Plasma Glucose: 297.7 mg/dL

## 2019-10-22 LAB — LIPID PANEL
Cholesterol: 107 mg/dL (ref 0–200)
HDL: 30 mg/dL — ABNORMAL LOW (ref >40)
LDL Cholesterol: 62 mg/dL (ref 0–99)
Total CHOL/HDL Ratio: 3.6 RATIO
Triglycerides: 73 mg/dL (ref <150)
VLDL: 15 mg/dL (ref 0–40)

## 2019-10-22 MED ORDER — PREDNISONE 50 MG PO TABS: ORAL_TABLET | ORAL | 0 refills | Status: DC

## 2019-10-22 MED ORDER — SODIUM CHLORIDE 0.9% FLUSH: 3.0000 mL | Freq: Two times a day (BID) | INTRAVENOUS | Status: DC

## 2019-10-22 NOTE — H&P (View-Only) (Signed)
Cardiology Office Note   Date:  10/22/2019   ID:  Oscar Mills, Oscar Mills 1950/07/11, MRN [id number]  PCP:  Susy Frizzle, MD  Cardiologist:   [name], MD   Chief Complaint  Patient presents with  . OTHER    6 month f/u c/o back/hip/right leg and foot pain. Meds reviewed verbally with pt.      History of Present Illness: Oscar Mills is a 69 y.o. male who presents for a followup visit regarding peripheral arterial disease. He is a smoker with hypertension and a family history of heart disease. He has known occlusion of the right SFA and proximal popliteal artery with right calf claudication. Noninvasive vascular evaluation in June 2015 indicated an ABI of 0.44 on the right and 1.2 on the left.  He was treated medically with cilostazol with improvement in symptoms.  His symptoms stayed stable for years up until 1 month ago when he had significant worsening of right leg claudication and then started having severe cramping and numbness in the right foot at night.  In addition, he had some dark discoloration of the toes without ulceration.  He also has been checking his blood sugar at home and he found elevated blood sugar around 400 that subsequently went to the 200 range.  He has not informed his primary care physician about this and he does not have history of diabetes.  He continues to take other medications he denies chest pain or worsening dyspnea.  He continues to smoke half a pack per day.   Past Medical History:  Diagnosis Date  . Arthritis    NECK  . Diabetes mellitus, type II ([place])    DIET CONTROLLED-NO MEDS  . Dysfunctional alcohol use    now quit  . GERD (gastroesophageal reflux disease)   . Hypertension   . Neuropathy    feet  . Pancreatitis   . Peripheral vascular disease ([place])   . Stress fracture of ankle     Past Surgical History:  Procedure Laterality Date  . CATARACT EXTRACTION W/PHACO Left 09/03/2019   Procedure: CATARACT EXTRACTION PHACO  AND INTRAOCULAR LENS PLACEMENT (IOC) LEFT VISION BLUE 9.84 00:54.9 17.9%;  Surgeon: Marchia Meiers, MD;  Location: [facility];  Service: Ophthalmology;  Laterality: Left;  Diabetic - diet controlled  . LOWER EXTREMITY ANGIOGRAM Bilateral 02/24/2014   Procedure: LOWER EXTREMITY ANGIOGRAM;  Surgeon: Wellington Hampshire, MD;  Location: [facility];  Service: Cardiovascular;  Laterality: Bilateral;  . PERIPHERAL VASCULAR CATHETERIZATION    . UMBILICAL HERNIA REPAIR N/A 03/05/2017   Procedure: HERNIA REPAIR UMBILICAL ADULT;  Surgeon: Leonie Green, MD;  Location: [facility];  Service: General;  Laterality: N/A;     Current Outpatient Medications  Medication Sig Dispense Refill  . Acetaminophen (TYLENOL PO) Take by mouth as needed.    Marland Kitchen amLODipine (NORVASC) 10 MG tablet Take 1 tablet (10 mg total) by mouth daily. 90 tablet 3  . aspirin 81 MG tablet Take 81 mg by mouth daily.    Marland Kitchen atorvastatin (LIPITOR) 10 MG tablet TAKE 1 TABLET BY MOUTH EVERY DAY 90 tablet 3  . Blood Glucose Monitoring Suppl (ACCU-CHEK AVIVA PLUS) w/Device KIT Check BS bid 1 kit 0  . cilostazol (PLETAL) 100 MG tablet TAKE 1 TABLET(100 MG) BY MOUTH TWICE DAILY 180 tablet 3  . diclofenac sodium (VOLTAREN) 1 % GEL APP UP TO 4 GRAMS EXT AA TID PRN  1  . gabapentin (NEURONTIN) 300 MG capsule Take 300  mg daily by mouth. Take nightly  0  . gentamicin cream (GARAMYCIN) 0.1 % Apply 1 application topically 3 (three) times daily. 30 g 1  . glucose blood (ACCU-CHEK AVIVA PLUS) test strip Check BS BID - new onset dm E11.9 100 each 12  . ibuprofen (ADVIL) 200 MG tablet Take 200 mg by mouth as needed.     . Lancets (ACCU-CHEK MULTICLIX) lancets Check BS bid - New onset DM E11.9 100 each 12  . pantoprazole (PROTONIX) 40 MG tablet Take 1 tablet (40 mg total) by mouth daily. (Patient taking differently: Take 40 mg by mouth daily as needed. ) 90 tablet 3  . Thiamine HCl (VITAMIN B1 PO) Take by mouth daily.     No current  facility-administered medications for this visit.    Allergies:   Iohexol    Social History:  The patient  reports that he has been smoking cigarettes. He has a 20.00 pack-year smoking history. He has never used smokeless tobacco. He reports previous alcohol use. He reports that he does not use drugs.   Family History:  The patient's family history includes Heart attack in his father.    ROS:  Please see the history of present illness.   Otherwise, review of systems are positive for none.   All other systems are reviewed and negative.    PHYSICAL EXAM: VS:  BP 140/60 (BP Location: Left Arm, Patient Position: Sitting, Cuff Size: Normal)   Pulse 72   Ht _0  (1.88 m)   Wt 161 lb 8 oz (73.3 kg)   SpO2 98%   BMI 20.74 kg/m  , BMI Body mass index is 20.74 kg/m. GEN: Well nourished, well developed, in no acute distress  HEENT: normal  Neck: no JVD, carotid bruits, or masses Cardiac: RRR; no murmurs, rubs, or gallops,no edema  Respiratory:  clear to auscultation bilaterally, normal work of breathing GI: soft, nontender, nondistended, + BS MS: no deformity or atrophy  Skin: warm and dry, no rash Neuro:  Strength and sensation are intact Psych: euthymic mood, full affect Vascular: Femoral pulses 0 on the right side and +2 on the left.  Distal pulses are palpable on the left side only.  There is dependent rubor on the right side.  There is dark discoloration of the toes with no ulceration or gangrene.   EKG:  EKG is ordered today. The ekg ordered today demonstrates normal sinus rhythm with short PR interval.   Recent Labs: No results found for requested labs within last 8760 hours.    Lipid Panel    Component Value Date/Time   CHOL 84 03/01/2016 0949   CHOL 94 (L) 05/14/2014 0829   TRIG 71 03/01/2016 0949   HDL 30 (L) 03/01/2016 0949   HDL 38 (L) 05/14/2014 0829   CHOLHDL 2.8 03/01/2016 0949   VLDL 14 03/01/2016 0949   LDLCALC 40 03/01/2016 0949   LDLCALC 36 05/14/2014  0829      Wt Readings from Last 3 Encounters:  10/22/19 161 lb 8 oz (73.3 kg)  09/03/19 167 lb (75.8 kg)  10/16/18 180 lb (81.6 kg)       No flowsheet data found.    ASSESSMENT AND PLAN:  1.  Peripheral arterial disease progression to rest pain: The patient has known history of right SFA and popliteal occlusion.  This has been stable for years up until recently when he started having rest pain affecting the right foot with significant worsening of claudication.  By exam, his  right femoral pulses not palpable and I suspect that he likely has high-grade iliac stenosis or occlusion.  This is a limb threatening situation and have recommended proceeding with urgent abdominal aortogram with lower extremity runoff and possible endovascular intervention.  I discussed the procedure in details as well as risks and benefits.  Planned access is via the left common femoral artery.   2. Essential hypertension: Currently on amlodipine which will be refilled.  3. Hyperlipidemia: Continue treatment with atorvastatin with a target LDL of less than 70.  We will check lipid and liver profile  4. Tobacco use: I again discussed the importance of smoking cessation.  5.  Hyperglycemia: He reports elevated blood sugar at home and it appears that he is diabetic now.  Will check hemoglobin A1c.  If renal function is normal, will start Metformin until he sees his primary care physician.   Disposition:   FU with me in 1 month  Signed,  [name], MD  10/22/2019 4:24 PM    [facility]

## 2019-10-22 NOTE — Patient Instructions (Addendum)
Medication Instructions:  Your physician recommends that you continue on your current medications as directed. Please refer to the Current Medication list given to you today.   Please take Prednisone 67m by mouth at: (An Rx has been sent to your pharmacy) Thirteen hours prior to cath 11:00pm On Tuesday Seven hours prior to cath 5:00am on Wednesday And prior to leaving home please take last dose of Prednisone 537mand Benadryl 5073my mouth.   *If you need a refill on your cardiac medications before your next appointment, please call your pharmacy*   Lab Work: Cmet, Cbc, Lipid, HgA1c today. Please have your labs drawn at the medical mall lab.  You will need a COVID test on 10/26/19. Please report to the medical arts entrance drive up test site between 8am-1pm.  If you have labs (blood work) drawn today and your tests are completely normal, you will receive your results only by: . MMarland KitchenChart Message (if you have MyChart) OR . A paper copy in the mail If you have any lab test that is abnormal or we need to change your treatment, we will call you to review the results.   Testing/Procedures: Dr. AriFletcher Anons recommended you have an abdominal aotogram   Follow-Up: [facility] and your health needs are our priority.  As part of our continuing mission to provide you with exceptional heart care, we have created designated Provider Care Teams.  These Care Teams include your primary Cardiologist (physician) and Advanced Practice Providers (APPs -  Physician Assistants and Nurse Practitioners) who all work together to provide you with the care you need, when you need it.  We recommend signing up for the patient portal called "MyChart".  Sign up information is provided on this After Visit Summary.  MyChart is used to connect with patients for Virtual Visits (Telemedicine).  Patients are able to view lab/test results, encounter notes, upcoming appointments, etc.  Non-urgent messages can be sent  to your provider as well.   To learn more about what you can do with MyChart, go to [email or web address]  Your next appointment:   3 week(s)  The format for your next appointment:   In Person  Provider:    You may see MuhKathlyn SacramentoD or one of the following Advanced Practice Providers on your designated Care Team:    ChrMurray HodgkinsP  RyaChristell FaithA-C  JacMarrianne MoodA-C    Other Instructions     [id number]ATorrie Mayers[place]pt: [phone]: 336Sheldon/29/2021  You are scheduled for a Peripheral Angiogram on Wednesday, May 5 with Dr. MuhKathlyn Sacramento1. [facility] [place] [id number] 10:30 AM (This time is two hours before your procedure to ensure your preparation). Free valet parking service is available.   Special note: Every effort is made to have your procedure done on time. Please understand that emergencies sometimes delay scheduled procedures.  2. Diet: Do not eat solid foods after midnight.  The patient may have clear liquids until 5am upon the day of the procedure.  3. Labs: You [facility] 1st desk on your right to register.  Address: [address] [id number]: 8am -  5pm  Phone: [phone].   4. Medication instructions in preparation for your procedure:   Contrast Allergy: Yes, Please take Prednisone 50m by mouth at: Thirteen hours prior to cath 11:00pm On Tuesday Seven hours prior to cath 5:00am on Wednesday And prior to leaving home please take last dose of Prednisone 5100mand Benadryl 5019my mouth.        Current Outpatient Medications (Cardiovascular):  .  amLODipine (NORVASC) 10 MG tablet, Take  1 tablet (10 mg total) by mouth daily. .  Marland Kitchentorvastatin (LIPITOR) 10 MG tablet, TAKE 1 TABLET BY MOUTH EVERY DAY   Current Outpatient Medications (Analgesics):  .  Marland Kitchencetaminophen (TYLENOL PO), Take by mouth as needed. .  Marland Kitchenspirin 81 MG tablet, Take 81 mg by mouth daily. .  Marland Kitchenbuprofen (ADVIL) 200 MG tablet, Take 200 mg by mouth as needed.   Current Outpatient Medications (Hematological):  .  cilostazol (PLETAL) 100 MG tablet, TAKE 1 TABLET(100 MG) BY MOUTH TWICE DAILY  Current Outpatient Medications (Other):  .  Blood Glucose Monitoring Suppl (ACCU-CHEK AVIVA PLUS) w/Device KIT, Check BS bid .  diclofenac sodium (VOLTAREN) 1 % GEL, APP UP TO 4 GRAMS EXT AA TID PRN .  gabapentin (NEURONTIN) 300 MG capsule, Take 300 mg daily by mouth. Take nightly .  gentamicin cream (GARAMYCIN) 0.1 %, Apply 1 application topically 3 (three) times daily. .  Marland Kitchenlucose blood (ACCU-CHEK AVIVA PLUS) test strip, Check BS BID - new onset dm E11.9 .  Lancets (ACCU-CHEK MULTICLIX) lancets, Check BS bid - New onset DM E11.9 .  pantoprazole (PROTONIX) 40 MG tablet, Take 1 tablet (40 mg total) by mouth daily. (Patient taking differently: Take 40 mg by mouth daily as needed. ) .  Thiamine HCl (VITAMIN B1 PO), Take by mouth daily. *For reference purposes while preparing patient instructions.   Delete this med list prior to printing instructions for patient.*    On the morning of your procedure, take your Aspirin and any morning medicines NOT listed above.  You may use sips of water.  5. Plan for one night stay--bring personal belongings. 6. Bring a current list of your medications and current insurance cards. 7. You MUST have a responsible person to drive you home. 8. Someone MUST be with you the first 24 hours after you arrive home or your discharge will be delayed. 9. Please wear clothes that are easy to get on and off and wear slip-on shoes.  Thank you for allowing us [place] care for you!   -- [facility]iovascular services

## 2019-10-22 NOTE — Progress Notes (Signed)
Cardiology Office Note   Date:  10/22/2019   ID:  Oscar, Mills 1950/07/11, MRN [id number]  PCP:  Oscar Frizzle, MD  Cardiologist:   Oscar Sacramento, MD   Chief Complaint  Patient presents with  . OTHER    6 month f/u c/o back/hip/right leg and foot pain. Meds reviewed verbally with pt.      History of Present Illness: Oscar Mills is a 69 y.o. male who presents for a followup visit regarding peripheral arterial disease. He is a smoker with hypertension and a family history of heart disease. He has known occlusion of the right SFA and proximal popliteal artery with right calf claudication. Noninvasive vascular evaluation in June 2015 indicated an ABI of 0.44 on the right and 1.2 on the left.  He was treated medically with cilostazol with improvement in symptoms.  His symptoms stayed stable for years up until 1 month ago when he had significant worsening of right leg claudication and then started having severe cramping and numbness in the right foot at night.  In addition, he had some dark discoloration of the toes without ulceration.  He also has been checking his blood sugar at home and he found elevated blood sugar around 400 that subsequently went to the 200 range.  He has not informed his primary care physician about this and he does not have history of diabetes.  He continues to take other medications he denies chest pain or worsening dyspnea.  He continues to smoke half a pack per day.   Past Medical History:  Diagnosis Date  . Arthritis    NECK  . Diabetes mellitus, type II ([place])    DIET CONTROLLED-NO MEDS  . Dysfunctional alcohol use    now quit  . GERD (gastroesophageal reflux disease)   . Hypertension   . Neuropathy    feet  . Pancreatitis   . Peripheral vascular disease ([place])   . Stress fracture of ankle     Past Surgical History:  Procedure Laterality Date  . CATARACT EXTRACTION W/PHACO Left 09/03/2019   Procedure: CATARACT EXTRACTION PHACO  AND INTRAOCULAR LENS PLACEMENT (IOC) LEFT VISION BLUE 9.84 00:54.9 17.9%;  Surgeon: Oscar Meiers, MD;  Location: [facility];  Service: Ophthalmology;  Laterality: Left;  Diabetic - diet controlled  . LOWER EXTREMITY ANGIOGRAM Bilateral 02/24/2014   Procedure: LOWER EXTREMITY ANGIOGRAM;  Surgeon: Oscar Hampshire, MD;  Location: [facility];  Service: Cardiovascular;  Laterality: Bilateral;  . PERIPHERAL VASCULAR CATHETERIZATION    . UMBILICAL HERNIA REPAIR N/A 03/05/2017   Procedure: HERNIA REPAIR UMBILICAL ADULT;  Surgeon: Oscar Green, MD;  Location: [facility];  Service: General;  Laterality: N/A;     Current Outpatient Medications  Medication Sig Dispense Refill  . Acetaminophen (TYLENOL PO) Take by mouth as needed.    Marland Kitchen amLODipine (NORVASC) 10 MG tablet Take 1 tablet (10 mg total) by mouth daily. 90 tablet 3  . aspirin 81 MG tablet Take 81 mg by mouth daily.    Marland Kitchen atorvastatin (LIPITOR) 10 MG tablet TAKE 1 TABLET BY MOUTH EVERY DAY 90 tablet 3  . Blood Glucose Monitoring Suppl (ACCU-CHEK AVIVA PLUS) w/Device KIT Check BS bid 1 kit 0  . cilostazol (PLETAL) 100 MG tablet TAKE 1 TABLET(100 MG) BY MOUTH TWICE DAILY 180 tablet 3  . diclofenac sodium (VOLTAREN) 1 % GEL APP UP TO 4 GRAMS EXT AA TID PRN  1  . gabapentin (NEURONTIN) 300 MG capsule Take 300  mg daily by mouth. Take nightly  0  . gentamicin cream (GARAMYCIN) 0.1 % Apply 1 application topically 3 (three) times daily. 30 g 1  . glucose blood (ACCU-CHEK AVIVA PLUS) test strip Check BS BID - new onset dm E11.9 100 each 12  . ibuprofen (ADVIL) 200 MG tablet Take 200 mg by mouth as needed.     . Lancets (ACCU-CHEK MULTICLIX) lancets Check BS bid - New onset DM E11.9 100 each 12  . pantoprazole (PROTONIX) 40 MG tablet Take 1 tablet (40 mg total) by mouth daily. (Patient taking differently: Take 40 mg by mouth daily as needed. ) 90 tablet 3  . Thiamine HCl (VITAMIN B1 PO) Take by mouth daily.     No current  facility-administered medications for this visit.    Allergies:   Iohexol    Social History:  The patient  reports that he has been smoking cigarettes. He has a 20.00 pack-year smoking history. He has never used smokeless tobacco. He reports previous alcohol use. He reports that he does not use drugs.   Family History:  The patient's family history includes Heart attack in his father.    ROS:  Please see the history of present illness.   Otherwise, review of systems are positive for none.   All other systems are reviewed and negative.    PHYSICAL EXAM: VS:  BP 140/60 (BP Location: Left Arm, Patient Position: Sitting, Cuff Size: Normal)   Pulse 72   Ht _0  (1.88 m)   Wt 161 lb 8 oz (73.3 kg)   SpO2 98%   BMI 20.74 kg/m  , BMI Body mass index is 20.74 kg/m. GEN: Well nourished, well developed, in no acute distress  HEENT: normal  Neck: no JVD, carotid bruits, or masses Cardiac: RRR; no murmurs, rubs, or gallops,no edema  Respiratory:  clear to auscultation bilaterally, normal work of breathing GI: soft, nontender, nondistended, + BS MS: no deformity or atrophy  Skin: warm and dry, no rash Neuro:  Strength and sensation are intact Psych: euthymic mood, full affect Vascular: Femoral pulses 0 on the right side and +2 on the left.  Distal pulses are palpable on the left side only.  There is dependent rubor on the right side.  There is dark discoloration of the toes with no ulceration or gangrene.   EKG:  EKG is ordered today. The ekg ordered today demonstrates normal sinus rhythm with short PR interval.   Recent Labs: No results found for requested labs within last 8760 hours.    Lipid Panel    Component Value Date/Time   CHOL 84 03/01/2016 0949   CHOL 94 (L) 05/14/2014 0829   TRIG 71 03/01/2016 0949   HDL 30 (L) 03/01/2016 0949   HDL 38 (L) 05/14/2014 0829   CHOLHDL 2.8 03/01/2016 0949   VLDL 14 03/01/2016 0949   LDLCALC 40 03/01/2016 0949   LDLCALC 36 05/14/2014  0829      Wt Readings from Last 3 Encounters:  10/22/19 161 lb 8 oz (73.3 kg)  09/03/19 167 lb (75.8 kg)  10/16/18 180 lb (81.6 kg)       No flowsheet data found.    ASSESSMENT AND PLAN:  1.  Peripheral arterial disease progression to rest pain: The patient has known history of right SFA and popliteal occlusion.  This has been stable for years up until recently when he started having rest pain affecting the right foot with significant worsening of claudication.  By exam, his  right femoral pulses not palpable and I suspect that he likely has high-grade iliac stenosis or occlusion.  This is a limb threatening situation and have recommended proceeding with urgent abdominal aortogram with lower extremity runoff and possible endovascular intervention.  I discussed the procedure in details as well as risks and benefits.  Planned access is via the left common femoral artery.   2. Essential hypertension: Currently on amlodipine which will be refilled.  3. Hyperlipidemia: Continue treatment with atorvastatin with a target LDL of less than 70.  We will check lipid and liver profile  4. Tobacco use: I again discussed the importance of smoking cessation.  5.  Hyperglycemia: He reports elevated blood sugar at home and it appears that he is diabetic now.  Will check hemoglobin A1c.  If renal function is normal, will start Metformin until he sees his primary care physician.   Disposition:   FU with me in 1 month  Signed,  Oscar Sacramento, MD  10/22/2019 4:24 PM    [facility]

## 2019-10-23 ENCOUNTER — Telehealth: Payer: Self-pay | Admitting: Cardiovascular Disease

## 2019-10-23 ENCOUNTER — Telehealth: Payer: Self-pay | Admitting: *Deleted

## 2019-10-23 ENCOUNTER — Other Ambulatory Visit: Payer: Self-pay | Admitting: Cardiovascular Disease

## 2019-10-23 MED ORDER — METFORMIN HCL 500 MG PO TABS: 500.0000 mg | ORAL_TABLET | Freq: Two times a day (BID) | ORAL | 1 refills | Status: DC

## 2019-10-23 NOTE — Telephone Encounter (Signed)
Left a message for the patient to call back.   Pre angiogram labs are fine with the exception of significantly elevated blood sugar and hemoglobin A1c. Add Metformin 500 mg twice daily. His cholesterol looks good. Forward results to primary care physician

## 2019-10-23 NOTE — Telephone Encounter (Signed)
Patient made aware of results and verbalized understanding.  The patient has been made aware to hold the Metformin the morning of the procedure on 10/28/19 and then 48 hours after.  He has been advised on the prednisone and when to take it prior to the procedure. If he has any further questions, he will call back.

## 2019-10-23 NOTE — Telephone Encounter (Signed)
Please advise if ok to refill Metformin 500 mg bid. Last prescribed by Arida not cardiac med.

## 2019-10-23 NOTE — Telephone Encounter (Signed)
Attempted to call patient. LMTCB 10/23/2019   

## 2019-10-23 NOTE — Telephone Encounter (Signed)
Please call to discuss medications to be taken before cath 5/5.

## 2019-10-23 NOTE — Telephone Encounter (Signed)
Patient returning call about results and also wants to go over medication hold instructions for procedure next week.   Please call

## 2019-10-23 NOTE — Telephone Encounter (Signed)
Pt denies having any current questions regarding cath planning. Encouraged pt to call if any questions come up.

## 2019-10-25 ENCOUNTER — Other Ambulatory Visit: Payer: Self-pay | Admitting: Cardiovascular Disease

## 2019-10-26 ENCOUNTER — Other Ambulatory Visit: Payer: Self-pay

## 2019-10-26 ENCOUNTER — Other Ambulatory Visit
Admission: RE | Admit: 2019-10-26 | Discharge: 2019-10-26 | Disposition: A | Discharge status: Home / Self Care | Payer: Medicare HMO | Source: Ambulatory Visit | Attending: Cardiovascular Disease | Admitting: Cardiovascular Disease

## 2019-10-26 DIAGNOSIS — Z01812 Encounter for preprocedural laboratory examination: Secondary | ICD-10-CM | POA: Insufficient documentation

## 2019-10-26 DIAGNOSIS — Z20822 Contact with and (suspected) exposure to covid-19: Secondary | ICD-10-CM | POA: Diagnosis not present

## 2019-10-26 LAB — SARS CORONAVIRUS 2 (TAT 6-24 HRS): SARS Coronavirus 2: NEGATIVE

## 2019-10-27 ENCOUNTER — Telehealth: Payer: Self-pay | Admitting: *Deleted

## 2019-10-27 NOTE — Telephone Encounter (Addendum)
Pt contacted pre-abdominal aortogram scheduled at [facility] for: Wednesday Oct 28, 2019 12:30 PM Verified arrival time and place: [place] [facility]) at: 10:30 AM   No solid food after midnight prior to cath, clear liquids until 5 AM day of procedure.  Contrast allergy: yes-reviewed with pt 13 hour Prednisone and Benadryl Prep: 10/27/19 11:30 PM Prednisone 50 mg 10/28/19 5:30 AM Prednisone 50 mg 10/28/19 just prior to leaving for hospital AM of procedure Prednisone 50 mg and Benadryl 50mg  Pt advised not to drive.  Hold: Metformin day of procedure and 48 hours post procedure  Except hold medications AM meds can be  taken pre-cath with sip of water including: ASA 81 mg Prednisone 50 mg Benadryl 50 mg  Confirmed patient has responsible adult to drive home post procedure and observe 24 hours after arriving home: yes  You are allowed ONE visitor in the waiting room during your procedure. Both you and your visitor must wear masks.      COVID-19 Pre-Screening Questions:  . In the past 7 to 10 days have you had a cough,  shortness of breath, headache, congestion, fever (100 or greater) body aches, chills, sore throat, or sudden loss of taste or sense of smell? no . Have you been around anyone with known Covid 19 in the past 7 to 10 days? no . Have you been around anyone who is awaiting Covid 19 test results in the past 7 to 10 days?no . Have you been around anyone who has mentioned symptoms of Covid 19 within the past 7 to 10 days? no   Reviewed procedure/mask/visitor instructions, COVID-19 screening questions with patient's wife (DPR), Vaughan Basta.

## 2019-10-28 ENCOUNTER — Ambulatory Visit (HOSPITAL_COMMUNITY)
Admission: RE | Admit: 2019-10-28 | Discharge: 2019-10-29 | Disposition: A | Discharge status: Home / Self Care | Payer: Medicare HMO | Attending: Cardiovascular Disease | Admitting: Cardiovascular Disease

## 2019-10-28 ENCOUNTER — Encounter (HOSPITAL_COMMUNITY)
Admission: RE | Disposition: A | Discharge status: Home / Self Care | Payer: Self-pay | Source: Home / Self Care | Attending: Cardiovascular Disease

## 2019-10-28 ENCOUNTER — Other Ambulatory Visit: Payer: Self-pay

## 2019-10-28 DIAGNOSIS — I739 Peripheral vascular disease, unspecified: Secondary | ICD-10-CM | POA: Diagnosis present

## 2019-10-28 DIAGNOSIS — Z79899 Other long term (current) drug therapy: Secondary | ICD-10-CM | POA: Insufficient documentation

## 2019-10-28 DIAGNOSIS — E118 Type 2 diabetes mellitus with unspecified complications: Secondary | ICD-10-CM

## 2019-10-28 DIAGNOSIS — Z888 Allergy status to other drugs, medicaments and biological substances status: Secondary | ICD-10-CM | POA: Insufficient documentation

## 2019-10-28 DIAGNOSIS — E1151 Type 2 diabetes mellitus with diabetic peripheral angiopathy without gangrene: Secondary | ICD-10-CM | POA: Insufficient documentation

## 2019-10-28 DIAGNOSIS — Z7982 Long term (current) use of aspirin: Secondary | ICD-10-CM | POA: Insufficient documentation

## 2019-10-28 DIAGNOSIS — M1990 Unspecified osteoarthritis, unspecified site: Secondary | ICD-10-CM | POA: Insufficient documentation

## 2019-10-28 DIAGNOSIS — I10 Essential (primary) hypertension: Secondary | ICD-10-CM | POA: Diagnosis not present

## 2019-10-28 DIAGNOSIS — E1140 Type 2 diabetes mellitus with diabetic neuropathy, unspecified: Secondary | ICD-10-CM | POA: Diagnosis not present

## 2019-10-28 DIAGNOSIS — I70221 Atherosclerosis of native arteries of extremities with rest pain, right leg: Secondary | ICD-10-CM | POA: Diagnosis not present

## 2019-10-28 DIAGNOSIS — E785 Hyperlipidemia, unspecified: Secondary | ICD-10-CM | POA: Diagnosis not present

## 2019-10-28 DIAGNOSIS — E1165 Type 2 diabetes mellitus with hyperglycemia: Secondary | ICD-10-CM | POA: Insufficient documentation

## 2019-10-28 DIAGNOSIS — Z8249 Family history of ischemic heart disease and other diseases of the circulatory system: Secondary | ICD-10-CM | POA: Insufficient documentation

## 2019-10-28 DIAGNOSIS — I745 Embolism and thrombosis of iliac artery: Secondary | ICD-10-CM | POA: Insufficient documentation

## 2019-10-28 DIAGNOSIS — F17200 Nicotine dependence, unspecified, uncomplicated: Secondary | ICD-10-CM | POA: Diagnosis present

## 2019-10-28 DIAGNOSIS — I70213 Atherosclerosis of native arteries of extremities with intermittent claudication, bilateral legs: Secondary | ICD-10-CM | POA: Diagnosis not present

## 2019-10-28 DIAGNOSIS — F17210 Nicotine dependence, cigarettes, uncomplicated: Secondary | ICD-10-CM | POA: Insufficient documentation

## 2019-10-28 DIAGNOSIS — K219 Gastro-esophageal reflux disease without esophagitis: Secondary | ICD-10-CM | POA: Diagnosis not present

## 2019-10-28 HISTORY — PX: ABDOMINAL AORTOGRAM W/LOWER EXTREMITY: CATH118223

## 2019-10-28 HISTORY — PX: PERIPHERAL VASCULAR INTERVENTION: CATH118257

## 2019-10-28 LAB — POCT ACTIVATED CLOTTING TIME
Activated Clotting Time: 257 seconds
Activated Clotting Time: 197 seconds

## 2019-10-28 LAB — GLUCOSE, CAPILLARY
Glucose-Capillary: 341 mg/dL — ABNORMAL HIGH (ref 70–99)
Glucose-Capillary: 297 mg/dL — ABNORMAL HIGH (ref 70–99)

## 2019-10-28 SURGERY — ABDOMINAL AORTOGRAM W/LOWER EXTREMITY
Anesthesia: LOCAL | Laterality: Bilateral

## 2019-10-28 MED ORDER — SODIUM CHLORIDE 0.9 % IV SOLN: 250.0000 mL | INTRAVENOUS | Status: DC | PRN

## 2019-10-28 MED ORDER — LABETALOL HCL 5 MG/ML IV SOLN
INTRAVENOUS | Status: DC | PRN
  Administered 2019-10-28 (×2): 10 mg via INTRAVENOUS

## 2019-10-28 MED ORDER — HEPARIN SODIUM (PORCINE) 1000 UNIT/ML IJ SOLN
INTRAMUSCULAR | Status: DC | PRN
  Administered 2019-10-28: 3000 [IU] via INTRAVENOUS
  Administered 2019-10-28: 7000 [IU] via INTRAVENOUS

## 2019-10-28 MED ORDER — SODIUM CHLORIDE 0.9 % WEIGHT BASED INFUSION
3.0000 mL/kg/h | INTRAVENOUS | Status: DC
  Administered 2019-10-28: 3 mL/kg/h via INTRAVENOUS

## 2019-10-28 MED ORDER — LABETALOL HCL 5 MG/ML IV SOLN
INTRAVENOUS | Status: AC
  Filled 2019-10-28: qty 4

## 2019-10-28 MED ORDER — FENTANYL CITRATE (PF) 100 MCG/2ML IJ SOLN
INTRAMUSCULAR | Status: AC
  Filled 2019-10-28: qty 2

## 2019-10-28 MED ORDER — ONDANSETRON HCL 4 MG/2ML IJ SOLN: 4.0000 mg | Freq: Four times a day (QID) | INTRAMUSCULAR | Status: DC | PRN

## 2019-10-28 MED ORDER — HEPARIN (PORCINE) IN NACL 1000-0.9 UT/500ML-% IV SOLN
INTRAVENOUS | Status: AC
  Filled 2019-10-28: qty 1000

## 2019-10-28 MED ORDER — MIDAZOLAM HCL 2 MG/2ML IJ SOLN
INTRAMUSCULAR | Status: DC | PRN
  Administered 2019-10-28 (×2): 1 mg via INTRAVENOUS

## 2019-10-28 MED ORDER — FENTANYL CITRATE (PF) 100 MCG/2ML IJ SOLN
INTRAMUSCULAR | Status: DC | PRN
  Administered 2019-10-28 (×3): 50 ug via INTRAVENOUS

## 2019-10-28 MED ORDER — LIDOCAINE HCL (PF) 1 % IJ SOLN
INTRAMUSCULAR | Status: AC
  Filled 2019-10-28: qty 30

## 2019-10-28 MED ORDER — SODIUM CHLORIDE 0.9 % WEIGHT BASED INFUSION
1.0000 mL/kg/h | INTRAVENOUS | Status: AC
  Administered 2019-10-28: 1 mL/kg/h via INTRAVENOUS

## 2019-10-28 MED ORDER — HEPARIN (PORCINE) IN NACL 1000-0.9 UT/500ML-% IV SOLN
INTRAVENOUS | Status: AC
  Filled 2019-10-28: qty 500

## 2019-10-28 MED ORDER — HEPARIN SODIUM (PORCINE) 1000 UNIT/ML IJ SOLN
INTRAMUSCULAR | Status: AC
  Filled 2019-10-28: qty 1

## 2019-10-28 MED ORDER — SODIUM CHLORIDE 0.9% FLUSH
3.0000 mL | Freq: Two times a day (BID) | INTRAVENOUS | Status: DC
  Administered 2019-10-29: 3 mL via INTRAVENOUS

## 2019-10-28 MED ORDER — MIDAZOLAM HCL 2 MG/2ML IJ SOLN
INTRAMUSCULAR | Status: AC
  Filled 2019-10-28: qty 2

## 2019-10-28 MED ORDER — LABETALOL HCL 5 MG/ML IV SOLN: 10.0000 mg | INTRAVENOUS | Status: DC | PRN

## 2019-10-28 MED ORDER — ASPIRIN EC 81 MG PO TBEC
81.0000 mg | DELAYED_RELEASE_TABLET | Freq: Every day | ORAL | Status: DC
  Administered 2019-10-29: 81 mg via ORAL
  Filled 2019-10-28: qty 1

## 2019-10-28 MED ORDER — CLOPIDOGREL BISULFATE 300 MG PO TABS
ORAL_TABLET | ORAL | Status: DC | PRN
  Administered 2019-10-28: 300 mg via ORAL

## 2019-10-28 MED ORDER — LIDOCAINE HCL (PF) 1 % IJ SOLN
INTRAMUSCULAR | Status: DC | PRN
  Administered 2019-10-28: 30 mL

## 2019-10-28 MED ORDER — CLOPIDOGREL BISULFATE 300 MG PO TABS
ORAL_TABLET | ORAL | Status: AC
  Filled 2019-10-28: qty 1

## 2019-10-28 MED ORDER — HEPARIN (PORCINE) IN NACL 1000-0.9 UT/500ML-% IV SOLN
INTRAVENOUS | Status: DC | PRN
  Administered 2019-10-28 (×3): 500 mL

## 2019-10-28 MED ORDER — SODIUM CHLORIDE 0.9 % WEIGHT BASED INFUSION: 1.0000 mL/kg/h | INTRAVENOUS | Status: DC

## 2019-10-28 MED ORDER — ATORVASTATIN CALCIUM 40 MG PO TABS
40.0000 mg | ORAL_TABLET | Freq: Every day | ORAL | Status: DC
  Administered 2019-10-28 – 2019-10-29 (×2): 40 mg via ORAL
  Filled 2019-10-28 (×2): qty 1

## 2019-10-28 MED ORDER — CLOPIDOGREL BISULFATE 75 MG PO TABS
75.0000 mg | ORAL_TABLET | Freq: Every day | ORAL | Status: DC
  Administered 2019-10-29: 75 mg via ORAL
  Filled 2019-10-28: qty 1

## 2019-10-28 MED ORDER — AMLODIPINE BESYLATE 10 MG PO TABS
10.0000 mg | ORAL_TABLET | Freq: Every day | ORAL | Status: DC
  Administered 2019-10-29: 10 mg via ORAL
  Filled 2019-10-28: qty 1

## 2019-10-28 MED ORDER — SODIUM CHLORIDE 0.9% FLUSH: 3.0000 mL | INTRAVENOUS | Status: DC | PRN

## 2019-10-28 MED ORDER — ACETAMINOPHEN 500 MG PO TABS
1000.0000 mg | ORAL_TABLET | Freq: Four times a day (QID) | ORAL | Status: DC | PRN
  Administered 2019-10-28 – 2019-10-29 (×2): 1000 mg via ORAL
  Filled 2019-10-28 (×2): qty 2

## 2019-10-28 MED ORDER — PANTOPRAZOLE SODIUM 40 MG PO TBEC
40.0000 mg | DELAYED_RELEASE_TABLET | Freq: Every day | ORAL | Status: DC
  Administered 2019-10-28 – 2019-10-29 (×2): 40 mg via ORAL
  Filled 2019-10-28 (×2): qty 1

## 2019-10-28 MED ORDER — ASPIRIN 81 MG PO CHEW: 81.0000 mg | CHEWABLE_TABLET | ORAL | Status: DC

## 2019-10-28 MED ORDER — IODIXANOL 320 MG/ML IV SOLN
INTRAVENOUS | Status: DC | PRN
  Administered 2019-10-28: 150 mL via INTRA_ARTERIAL

## 2019-10-28 MED ORDER — GABAPENTIN 300 MG PO CAPS
300.0000 mg | ORAL_CAPSULE | Freq: Four times a day (QID) | ORAL | Status: DC
  Administered 2019-10-28 – 2019-10-29 (×4): 300 mg via ORAL
  Filled 2019-10-28 (×4): qty 1

## 2019-10-28 SURGICAL SUPPLY — 27 items
BALLN MUSTANG 5X80X75 (BALLOONS) ×3
BALLN MUSTANG 7X60X75 (BALLOONS) ×3
BALLOON MUSTANG 5X80X75 (BALLOONS) IMPLANT
BALLOON MUSTANG 7X60X75 (BALLOONS) IMPLANT
CATH ANGIO 5F PIGTAIL 65CM (CATHETERS) ×1 IMPLANT
CATH CROSS OVER TEMPO 5F (CATHETERS) ×1 IMPLANT
CATH NAVICROSS ST 65CM (CATHETERS) IMPLANT
CATHETER NAVICROSS ST 65CM (CATHETERS) ×3
CLOSURE MYNX CONTROL 6F/7F (Vascular Products) ×2 IMPLANT
GLIDEWIRE ADV .035X180CM (WIRE) ×1 IMPLANT
KIT ENCORE 26 ADVANTAGE (KITS) ×2 IMPLANT
KIT MICROPUNCTURE NIT STIFF (SHEATH) ×2 IMPLANT
KIT PV (KITS) ×3 IMPLANT
SHEATH BRITE TIP 7FR 35CM (SHEATH) ×2 IMPLANT
SHEATH PINNACLE 5F 10CM (SHEATH) ×1 IMPLANT
SHEATH PINNACLE 7F 10CM (SHEATH) ×2 IMPLANT
SHEATH PROBE COVER 6X72 (BAG) ×2 IMPLANT
STENT ABSOLUTE PRO 8X40X135 (Permanent Stent) ×1 IMPLANT
STENT EXPRESS LD 8X27X75 (Permanent Stent) ×1 IMPLANT
STENT EXPRESS LD 9X57X75 (Permanent Stent) ×1 IMPLANT
STENT VIABAHNBX 8X59X80 (Permanent Stent) ×1 IMPLANT
STOPCOCK MORSE 400PSI 3WAY (MISCELLANEOUS) ×1 IMPLANT
SYR MEDRAD MARK 7 150ML (SYRINGE) ×3 IMPLANT
TRANSDUCER W/STOPCOCK (MISCELLANEOUS) ×3 IMPLANT
TRAY PV CATH (CUSTOM PROCEDURE TRAY) ×3 IMPLANT
WIRE BENTSON .035X145CM (WIRE) ×1 IMPLANT
WIRE HI TORQ VERSACORE J 260CM (WIRE) ×1 IMPLANT

## 2019-10-28 NOTE — Interval H&P Note (Signed)
History and Physical Interval Note:  10/28/2019 12:19 PM  Oscar Mills  has presented today for surgery, with the diagnosis of PAD.  The various methods of treatment have been discussed with the patient and family. After consideration of risks, benefits and other options for treatment, the patient has consented to  Procedure(s): ABDOMINAL AORTOGRAM W/LOWER EXTREMITY (Bilateral) as a surgical intervention.  The patient's history has been reviewed, patient examined, no change in status, stable for surgery.  I have reviewed the patient's chart and labs.  Questions were answered to the patient's satisfaction.     [name]

## 2019-10-28 NOTE — Progress Notes (Signed)
Pt arrived to 4e from cath lab. Vitals obtained and stable. Telemetry box applied and CCMD notified x2 verifiers. CHG bath done. Wife at bedside. Pt denies needs.

## 2019-10-29 DIAGNOSIS — I739 Peripheral vascular disease, unspecified: Secondary | ICD-10-CM | POA: Diagnosis not present

## 2019-10-29 DIAGNOSIS — K219 Gastro-esophageal reflux disease without esophagitis: Secondary | ICD-10-CM | POA: Diagnosis not present

## 2019-10-29 DIAGNOSIS — E785 Hyperlipidemia, unspecified: Secondary | ICD-10-CM | POA: Diagnosis not present

## 2019-10-29 DIAGNOSIS — E1151 Type 2 diabetes mellitus with diabetic peripheral angiopathy without gangrene: Secondary | ICD-10-CM | POA: Diagnosis not present

## 2019-10-29 DIAGNOSIS — E1140 Type 2 diabetes mellitus with diabetic neuropathy, unspecified: Secondary | ICD-10-CM | POA: Diagnosis not present

## 2019-10-29 DIAGNOSIS — I10 Essential (primary) hypertension: Secondary | ICD-10-CM | POA: Diagnosis not present

## 2019-10-29 DIAGNOSIS — I70221 Atherosclerosis of native arteries of extremities with rest pain, right leg: Secondary | ICD-10-CM | POA: Diagnosis not present

## 2019-10-29 DIAGNOSIS — F17210 Nicotine dependence, cigarettes, uncomplicated: Secondary | ICD-10-CM | POA: Diagnosis not present

## 2019-10-29 DIAGNOSIS — I745 Embolism and thrombosis of iliac artery: Secondary | ICD-10-CM | POA: Diagnosis not present

## 2019-10-29 DIAGNOSIS — E118 Type 2 diabetes mellitus with unspecified complications: Secondary | ICD-10-CM

## 2019-10-29 DIAGNOSIS — E1165 Type 2 diabetes mellitus with hyperglycemia: Secondary | ICD-10-CM | POA: Diagnosis not present

## 2019-10-29 LAB — GLUCOSE, CAPILLARY
Glucose-Capillary: 520 mg/dL (ref 70–99)
Glucose-Capillary: 320 mg/dL — ABNORMAL HIGH (ref 70–99)

## 2019-10-29 LAB — GLUCOSE, RANDOM: Glucose, Bld: 458 mg/dL — ABNORMAL HIGH (ref 70–99)

## 2019-10-29 MED ORDER — CLOPIDOGREL BISULFATE 75 MG PO TABS: 75.0000 mg | ORAL_TABLET | Freq: Every day | ORAL | 1 refills | Status: DC

## 2019-10-29 MED ORDER — INSULIN STARTER KIT- PEN NEEDLES (ENGLISH): 1.0000 | Freq: Once | Status: DC

## 2019-10-29 MED ORDER — INSULIN ASPART 100 UNIT/ML ~~LOC~~ SOLN
0.0000 [IU] | Freq: Three times a day (TID) | SUBCUTANEOUS | Status: DC
  Administered 2019-10-29: 15 [IU] via SUBCUTANEOUS

## 2019-10-29 MED ORDER — JARDIANCE 10 MG PO TABS
10.0000 mg | ORAL_TABLET | Freq: Every day | ORAL | 0 refills | Status: DC
Start: 2019-10-29 — End: 2019-11-12

## 2019-10-29 MED ORDER — ATORVASTATIN CALCIUM 40 MG PO TABS: 40.0000 mg | ORAL_TABLET | Freq: Every day | ORAL | 6 refills | Status: DC

## 2019-10-29 MED ORDER — LIVING WELL WITH DIABETES BOOK
Freq: Once | Status: AC
  Filled 2019-10-29: qty 1

## 2019-10-29 MED ORDER — INSULIN ASPART 100 UNIT/ML ~~LOC~~ SOLN: 0.0000 [IU] | Freq: Every day | SUBCUTANEOUS | Status: DC

## 2019-10-29 MED FILL — ATORVASTATIN CALCIUM 40 MG: 40 | 30 days supply | Qty: 30 | Fill #0

## 2019-10-29 MED FILL — JARDIANCE 10 MG TABLET: 10 | 30 days supply | Qty: 30 | Fill #0

## 2019-10-29 MED FILL — CLOPIDOGREL 75 MG TABLET: 75 | 90 days supply | Qty: 90 | Fill #0

## 2019-10-29 NOTE — Progress Notes (Signed)
Progress Note  Patient Name: GRIMM BASSANI Date of Encounter: 10/29/2019  Primary Cardiologist: [name], MD   Subjective   Lower extremity cath yesterday showed disease in the rt common iliac artery with significant disease in the prox external iliac artery, diffuse subocclusion disease on the SFA with collateral. Patient was treated with a stent in the right common iliac and external as well as the left common iliac artery.   No complications overnight. Cath sites, B/L groin with no pain, bleeding, bruising.  Possible discharge.   Inpatient Medications    Scheduled Meds: . amLODipine  10 mg Oral Daily  . aspirin EC  81 mg Oral Daily  . atorvastatin  40 mg Oral Daily  . clopidogrel  75 mg Oral Q breakfast  . gabapentin  300 mg Oral QID  . pantoprazole  40 mg Oral Daily  . sodium chloride flush  3 mL Intravenous Q12H  . sodium chloride flush  3 mL Intravenous Q12H   Continuous Infusions: . sodium chloride     PRN Meds: sodium chloride, acetaminophen, labetalol, ondansetron (ZOFRAN) IV, sodium chloride flush   Vital Signs    Vitals:   10/28/19 2258 10/29/19 0003 10/29/19 0400 10/29/19 0416  BP: (!) 133/50 (!) 143/52 (!) 155/59 (!) 141/69  Pulse: 73 66 67 61  Resp: (!) 21 20 20 12   Temp: 98.3 F (36.8 C) 98.2 F (36.8 C)  (!) 97.4 F (36.3 C)  TempSrc: Oral Oral  Oral  SpO2: 94% 95% 93% 96%  Weight:      Height:        Intake/Output Summary (Last 24 hours) at 10/29/2019 0853 Last data filed at 10/29/2019 0420 Gross per 24 hour  Intake 777.28 ml  Output 825 ml  Net -47.72 ml   Last 3 Weights 10/28/2019 10/22/2019 09/03/2019  Weight (lbs) 161 lb 161 lb 8 oz 167 lb  Weight (kg) 73.029 kg 73.256 kg 75.751 kg      Telemetry    NSR, HR 60-70 - Personally Reviewed  ECG    No new - Personally Reviewed  Physical Exam   GEN: No acute distress.   Neck: No JVD Cardiac: RRR, no murmurs, rubs, or gallops; difficult to palpate pulses B/L Respiratory:  Clear to auscultation bilaterally. GI: Soft, nontender, non-distended  MS: No edema; No deformity; right and left groin not tender or firm; no bleeding or bruising Neuro:  Nonfocal  Psych: Normal affect   Labs    High Sensitivity Troponin:  No results for input(s): TROPONINIHS in the last 720 hours.    Chemistry Recent Labs  Lab 10/22/19 1726  NA 134*  K 4.4  CL 105  CO2 23  GLUCOSE 321*  BUN 26*  CREATININE 0.65  CALCIUM 9.8  PROT 7.1  ALBUMIN 4.3  AST 13*  ALT 22  ALKPHOS 73  BILITOT 0.7  GFRNONAA >60  GFRAA >60  ANIONGAP 6     Hematology Recent Labs  Lab 10/22/19 1726  WBC 16.4*  RBC 5.29  HGB 16.4  HCT 48.1  MCV 90.9  MCH 31.0  MCHC 34.1  RDW 13.2  PLT 266    BNPNo results for input(s): BNP, PROBNP in the last 168 hours.   DDimer No results for input(s): DDIMER in the last 168 hours.   Radiology    PERIPHERAL VASCULAR CATHETERIZATION  Result Date: 10/28/2019 1.  No significant aortic stenosis. 2.  Right lower extremity: Flush occlusion of the common iliac artery with significant disease  in the proximal external iliac artery, diffuse subocclusive disease in the SFA with short occlusion in the mid segment with reconstitution via collaterals from the profunda followed by short segment occlusion of the popliteal artery above the knee and likely three-vessel runoff below the knee. 3.  Severe left common iliac artery stenosis.  The left leg was not fully imaged. 4.  Successful complex revascularization of right common iliac and external iliac as well as left common iliac artery with kissing stent placement extending into the distal aorta. Recommendations: Dual antiplatelet therapy for at least 6 months. Aggressive treatment of risk factors. Suspect significant improvement in patient's right leg pain with this revascularization.  However, he has known chronically occluded right SFA and popliteal arteries and this can be addressed in the near future if he has  residual symptoms. Observe overnight and possible discharge home tomorrow.   Cardiac Studies   PV Cath 10/28/19 1.  No significant aortic stenosis. 2.  Right lower extremity: Flush occlusion of the common iliac artery with significant disease in the proximal external iliac artery, diffuse subocclusive disease in the SFA with short occlusion in the mid segment with reconstitution via collaterals from the profunda followed by short segment occlusion of the popliteal artery above the knee and likely three-vessel runoff below the knee. 3.  Severe left common iliac artery stenosis.  The left leg was not fully imaged. 4.  Successful complex revascularization of right common iliac and external iliac as well as left common iliac artery with kissing stent placement extending into the distal aorta.  Recommendations: Dual antiplatelet therapy for at least 6 months. Aggressive treatment of risk factors. Suspect significant improvement in patient's right leg pain with this revascularization.  However, he has known chronically occluded right SFA and popliteal arteries and this can be addressed in the near future if he has residual symptoms. Observe overnight and possible discharge home tomorrow.  Patient Profile     69 y.o. male with pmh of PAD, tobacco use, HTN, family history of heart disease, PAD with known occlusion of the right SFA and prox popliteal artery with right calf claudication who was admitted from the office for urgent abdominal aortogram with lower extremity runoff.   Assessment & Plan    PAD - known right SFA and popliteal occlusion - he was seen in the office and reported worsening symptoms, no pulses on exam, and was admitted for aortogram with lower extremity runoff with possible intervention. - Cath showed right extremity with significant disease treated with complex revascularization of right common iliac/and external iliac as well as left common iliac artery with kissing stent  placement extending into the aorta. Also showed no significant aortic disease, severe left common iliac artery stenosis and known SFA/popliteal disease. - With known occluded right SFA and popliteal could possible addressed in the neat future. Will follow-up with Dr. Fletcher Anon for this - Plan for DAPT for 6 months with Aspirin and plavix.  - Cath sites are stable - Can likely go home.  HTN - amlodipine  HLD - atorvastatin  - LDL 62 - LDL goal <70  DM2 - SSI  For questions or updates, please contact [facility] Please consult [email or web address] for contact info under        Signed, Amarianna Abplanalp Ninfa Meeker, PA-C  10/29/2019, 8:53 AM

## 2019-10-29 NOTE — Progress Notes (Signed)
  RD consulted for nutrition education regarding diabetes.   Lab Results  Component Value Date   HGBA1C 12.0 (H) 10/22/2019    RD provided "Carbohydrate Counting for People with Diabetes" handout from the Academy of [employer] and Dietetics. Discussed different food groups and their effects on blood sugar, emphasizing carbohydrate-containing foods. Provided list of carbohydrates and recommended serving sizes of common foods.  Discussed importance of controlled and consistent carbohydrate intake throughout the day. Provided examples of ways to balance meals/snacks and encouraged intake of high-fiber, whole grain complex carbohydrates. Teach back method used.  Pt endorses consuming 3 meals daily that consist of B-protein, carb L-deli sandwich D- protein, carb, vegetable. Typically drinks diet tea. Was recently diagnosed with DM this week by PCP. Just started taking metformin. Discussed importance of balanced meals, which foods contain carbohydrates, how to read a food label, and how to make appropriate beverage substitutions.    Expect fair compliance.  Labs and medications reviewed. No further nutrition interventions warranted at this time. RD contact information provided. If additional nutrition issues arise, please re-consult RD.  Oscar Mills RD, LDN Clinical Nutrition Pager listed in [place]

## 2019-10-29 NOTE — Progress Notes (Signed)
Patient Oscar Mills. Patient he ate cereal and banana before Oscar check. PA Furth notified and aware of protocol for STAT glucose lab. Patient given 15 unit novolog per order. Pt resting with call bell within reach.  Will continue to monitor.

## 2019-10-29 NOTE — Progress Notes (Signed)
Pt/family given discharge instructions, medication lists, follow up appointments, and when to call the doctor.  Pt/family verbalizes understanding. Pt given signs and symptoms of infection. Pt/ wife given detailed instructions for diabetes education. Transported patient to main entrance. Wife could not find phone. It was in patient room in toilet. Wife said it must have fell out of her pocket. That is where she remembers putting it last. Patient assisted to POV. Payton Emerald, RN

## 2019-10-29 NOTE — Progress Notes (Signed)
Inpatient Diabetes Program Recommendations  AACE/ADA: New Consensus Statement on Inpatient Glycemic Control (2015)  Target Ranges:  Prepandial:   less than 140 mg/dL      Peak postprandial:   less than 180 mg/dL (1-2 hours)      Critically ill patients:  140 - 180 mg/dL   Lab Results  Component Value Date   GLUCAP 320 (H) 10/29/2019   HGBA1C 12.0 (H) 10/22/2019    Review of Glycemic Control  Results for DIVIT, STIPP (MRN [id number]) as of 10/29/2019 15:12  Ref. Range 10/28/2019 10:49 10/28/2019 14:41 10/29/2019 11:16 10/29/2019 11:17 10/29/2019 14:04  Glucose-Capillary Latest Ref Range: 70 - 99 mg/dL 341 (H) 297 (H) 501 (HH) 520 (HH) 320 (H)    Diabetes history: New DM2-recently diagnosed   Outpatient Diabetes medications: Metformin 500 mg daily  Current orders for Inpatient glycemic control: Novolog 0-15 tid + 0-5 QHS  Note:  Spoke with patient and wife at bedside.  Reviewed patient's current A1c of 12% (average blood sugar of 300 mg/dl). Explained what a A1c is and what it measures. Also reviewed goal A1c with patient, importance of good glucose control @ home, and blood sugar goals.  Ordered Living Well with Diabetes, RD consult, Insulin starter kit.  Insulin starter kit was discontinued as cardiology is discharging patient on metformin and Jardiance.  Paged by Katharine Look, RN with concerns of CBG of 520 @ 1117.  Reached out to Dr. Fletcher Anon.  Since patient has been recently diagnosed with DM, Dr. Fletcher Anon would like to discharge on Metformin and Jardiance rather than insulin.    Patient does have a glucometer at home and checks his sugar 2x a day.  Wife states blood sugar is usually 230-260 mg/dl.  Educated patient and wife on CHO's and importance of limiting CHO's to 60-70 CHO per meal.  Patient does not drink any regular sodas.  He does like milk.  Explained milk does contain approx. 20 grams of CHO and to limit the milk intake.  Educated patient on The Plate Method.  He is very active.  Explained  exercise can assist in decreasing blood sugar.  Asked patient to call MD if blood sugars are consistently > 200 mg/dl.  Encouraged patient to drink plenty of water.    I did educate patient on insulin and pen administration incase he is prescribed insulin in the future.  We discussed hypoglycemia symtoms and treatment.    He is current with Dr. Dennard Schaumann for PCP.  Encouraged patient to keep scheduled follow up appointments and bring his meter with him to appointments for MD to review.    Thank you, Reche Dixon, RN, BSN Diabetes Coordinator Inpatient Diabetes Program [phone] (team pager from 8a-5p)

## 2019-10-29 NOTE — Discharge Summary (Signed)
Discharge Summary    Patient ID: Oscar Mills MRN: [id number]; DOB: July 22, 1950  Admit date: 10/28/2019 Discharge date: 10/29/2019  Primary Care Provider: Susy Frizzle, MD  Primary Cardiologist: [name], MD  Primary Electrophysiologist:  None   Discharge Diagnoses    Principal Problem:   PAD (peripheral artery disease) [facility]) Active Problems:   Smoker   Claudication of right lower extremity ([place])   Type 2 diabetes mellitus with complication, without long-term current use of insulin ([name])   Hypertension   Hyperlipidemia   Diagnostic Studies/Procedures    Peripheral vascular catheterization 10/28/19 1.  No significant aortic stenosis. 2.  Right lower extremity: Flush occlusion of the common iliac artery with significant disease in the proximal external iliac artery, diffuse subocclusive disease in the SFA with short occlusion in the mid segment with reconstitution via collaterals from the profunda followed by short segment occlusion of the popliteal artery above the knee and likely three-vessel runoff below the knee. 3.  Severe left common iliac artery stenosis.  The left leg was not fully imaged. 4.  Successful complex revascularization of right common iliac and external iliac as well as left common iliac artery with kissing stent placement extending into the distal aorta.  Recommendations: Dual antiplatelet therapy for at least 6 months. Aggressive treatment of risk factors. Suspect significant improvement in patient's right leg pain with this revascularization.  However, he has known chronically occluded right SFA and popliteal arteries and this can be addressed in the near future if he has residual symptoms. Observe overnight and possible discharge home tomorrow.  _____________   History of Present Illness     Oscar Mills is a 69 y.o. male with history of tobacco use, HTN, family history of heart disease, and PAD with known occlusion of the right SFA and  proximal popliteal artery who was admitted for right claudication symptoms for urgent abdominal aortogram with lower extremity runoff. The patient was seen in the office 5/5 by Dr. Fletcher Anon and reported significantly worsened right leg claudication with severe cramping and numbness in the right foot with pain at rest. Also reported some dark discoloration of the toes without ulceration. Blood sugar checks at home found to be high but now in the 200 range for which he saw his PCP for, recent A1C 12.0. Denied chest pain. On exam the patient did not have palpable pulse and was admitted for lower extremity arteriography.   Hospital Course     Consultants: None  On admission the patient was started on metformin for hyperglycemia. The patient was taken to the cath lab for lower extremity arteriography which showed no significant aortic stenosis, flush occlusion of the common rt iliac artery with significant disease in the proximal external iliac artery, diffuse subocclusive disease in the SFA with short occlusion in the mid segment with reconstitution via collaterals from the profunda followed by short segment occlusion of the popliteal artery above the knee and likely three-vessel runoff below the knee, severe left common iliac artery. The patient was treated with successful complex revascularization of the right common iliac and external iliac as well as left common iliac artery with kissing stent placement. Recommendations for DAPT with aspirin and plavix for 6 months. If patient has residual symptoms can consider intervention of known occluded SFA and popliteal arteries. The patient had no complications overnight. Bilateral groin cath sites remained stable with no significant bleeding, bruising, or pain. The patient had soreness in his right foot but reported improved pain. Given new  diabetes and A1C of 12 will start Jardiance 10 mg daily. Atorvastatin was increased to 40 mg daily. Will send in prescriptions for  aspirin and plavix. Stop Pletal. Continue other home meds. Re-start Metformin in 48 hours.   The patient was evaluated by Dr. Oval Linsey on 10/29/19 and felt to be stable for discharge.   Did the patient have an acute coronary syndrome (MI, NSTEMI, STEMI, etc) this admission?:  No                               Did the patient have a percutaneous coronary intervention (stent / angioplasty)?:  No.   _____________  Discharge Vitals Blood pressure (!) 143/60, pulse 65, temperature 98.2 F (36.8 C), temperature source Oral, resp. rate 18, height 6' 2"  (1.88 m), weight 73 kg, SpO2 97 %.  Filed Weights   10/28/19 1056  Weight: 73 kg    Labs & Radiologic Studies    CBC No results for input(s): WBC, NEUTROABS, HGB, HCT, MCV, PLT in the last 72 hours. Basic Metabolic Panel No results for input(s): NA, K, CL, CO2, GLUCOSE, BUN, CREATININE, CALCIUM, MG, PHOS in the last 72 hours. Liver Function Tests No results for input(s): AST, ALT, ALKPHOS, BILITOT, PROT, ALBUMIN in the last 72 hours. No results for input(s): LIPASE, AMYLASE in the last 72 hours. High Sensitivity Troponin:   No results for input(s): TROPONINIHS in the last 720 hours.  BNP Invalid input(s): POCBNP D-Dimer No results for input(s): DDIMER in the last 72 hours. Hemoglobin A1C No results for input(s): HGBA1C in the last 72 hours. Fasting Lipid Panel No results for input(s): CHOL, HDL, LDLCALC, TRIG, CHOLHDL, LDLDIRECT in the last 72 hours. Thyroid Function Tests No results for input(s): TSH, T4TOTAL, T3FREE, THYROIDAB in the last 72 hours.  Invalid input(s): FREET3 _____________  PERIPHERAL VASCULAR CATHETERIZATION  Result Date: 10/28/2019 1.  No significant aortic stenosis. 2.  Right lower extremity: Flush occlusion of the common iliac artery with significant disease in the proximal external iliac artery, diffuse subocclusive disease in the SFA with short occlusion in the mid segment with reconstitution via collaterals from  the profunda followed by short segment occlusion of the popliteal artery above the knee and likely three-vessel runoff below the knee. 3.  Severe left common iliac artery stenosis.  The left leg was not fully imaged. 4.  Successful complex revascularization of right common iliac and external iliac as well as left common iliac artery with kissing stent placement extending into the distal aorta. Recommendations: Dual antiplatelet therapy for at least 6 months. Aggressive treatment of risk factors. Suspect significant improvement in patient's right leg pain with this revascularization.  However, he has known chronically occluded right SFA and popliteal arteries and this can be addressed in the near future if he has residual symptoms. Observe overnight and possible discharge home tomorrow.  Disposition   Pt is being discharged home today in good condition.  Follow-up Plans & Appointments    Follow-up Information    [facility] up on 11/12/2019.   Specialty: Cardiology Why: @ 3PM Contact information: [address] [place] [phone]           Discharge Medications   Allergies as of 10/29/2019      Reactions   Iohexol     Code: HIVES, Desc: HIVES WITH OMNIPAQUE 300 OBSERVED BY DR Alvester Chou NO MEDS GIVEN  Medication List    STOP taking these medications   cilostazol 100 MG tablet Commonly known as: PLETAL     TAKE these medications   Accu-Chek Aviva Plus w/Device Kit Check BS bid   accu-chek multiclix lancets Check BS bid - New onset DM E11.9   acetaminophen 500 MG tablet Commonly known as: TYLENOL Take 1,000 mg by mouth every 6 (six) hours as needed for moderate pain.   amLODipine 10 MG tablet Commonly known as: NORVASC TAKE 1 TABLET(10 MG) BY MOUTH DAILY   aspirin 81 MG tablet Take 81 mg by mouth daily.   atorvastatin 40 MG tablet Commonly known as: LIPITOR Take 1 tablet (40 mg total) by mouth  daily. Start taking on: Oct 30, 2019 What changed:   medication strength  how much to take  how to take this  when to take this  additional instructions   clopidogrel 75 MG tablet Commonly known as: PLAVIX Take 1 tablet (75 mg total) by mouth daily with breakfast. Start taking on: Oct 30, 2019   gabapentin 300 MG capsule Commonly known as: NEURONTIN Take 300 mg by mouth 4 (four) times daily.   gentamicin cream 0.1 % Commonly known as: GARAMYCIN Apply 1 application topically 3 (three) times daily.   glucose blood test strip Commonly known as: Accu-Chek Aviva Plus Check BS BID - new onset dm E11.9   Jardiance 10 MG Tabs tablet Generic drug: empagliflozin Take 10 mg by mouth daily before breakfast.   metFORMIN 500 MG tablet Commonly known as: GLUCOPHAGE TAKE 1 TABLET(500 MG) BY MOUTH TWICE DAILY WITH A MEAL   pantoprazole 40 MG tablet Commonly known as: PROTONIX Take 1 tablet (40 mg total) by mouth daily. What changed:   when to take this  reasons to take this   predniSONE 50 MG tablet Commonly known as: DELTASONE Take 1 tablet by mouth 13 hours prior to the procedure, 1 tablet 7 hours prior, 1 tablet before leaving home.          Outstanding Labs/Studies   None  Duration of Discharge Encounter   Greater than 30 minutes including physician time.  Signed, Rosibel Giacobbe Ninfa Meeker, PA-C 10/29/2019, 12:24 PM

## 2019-10-29 NOTE — Progress Notes (Signed)
Spoke with PA Kathlen Mody and Diabetes coordinator Tama Headings and Geoffry Paradise.  Diabetes education again given to ensure patient and wife aware of seriousness of maintaining glucose levels. Wife anxious for patient to go home. Wife states she read about diabetes at home and she will read all the paperwork we give her.

## 2019-11-06 ENCOUNTER — Other Ambulatory Visit: Payer: Self-pay

## 2019-11-06 ENCOUNTER — Encounter: Payer: Self-pay | Admitting: Family Medicine

## 2019-11-06 ENCOUNTER — Ambulatory Visit (INDEPENDENT_AMBULATORY_CARE_PROVIDER_SITE_OTHER): Payer: Medicare HMO | Admitting: Family Medicine

## 2019-11-06 VITALS — BP 120/60 | HR 74 | Temp 97.8°F | Resp 18 | Ht 74.0 in | Wt 158.0 lb

## 2019-11-06 DIAGNOSIS — IMO0002 Reserved for concepts with insufficient information to code with codable children: Secondary | ICD-10-CM

## 2019-11-06 DIAGNOSIS — E1165 Type 2 diabetes mellitus with hyperglycemia: Secondary | ICD-10-CM

## 2019-11-06 DIAGNOSIS — E1151 Type 2 diabetes mellitus with diabetic peripheral angiopathy without gangrene: Secondary | ICD-10-CM | POA: Diagnosis not present

## 2019-11-06 MED ORDER — METFORMIN HCL 500 MG PO TABS: 1000.0000 mg | ORAL_TABLET | Freq: Two times a day (BID) | ORAL | 3 refills | Status: DC

## 2019-11-06 MED ORDER — GLIPIZIDE ER 10 MG PO TB24: 10.0000 mg | ORAL_TABLET | Freq: Every day | ORAL | 3 refills | Status: DC

## 2019-11-06 NOTE — Progress Notes (Signed)
Subjective:    Patient ID: Oscar Mills, male    DOB: Jan 27, 1951, 69 y.o.   MRN: [id number]  HPI  Patient has not been seen by me since 2018.  Recently I received lab work from his cardiologist indicating a hemoglobin A1c of 12 and random blood sugars between 300-500!Marland Kitchen  At that time his cardiologist started him on Metformin.  He is currently taking 500 mg twice daily.  He denies any diarrhea.  He did take Jardiance in the past but he stopped it due to "stomach upset.  He is here today to discuss other treatment options. Past Medical History:  Diagnosis Date  . Arthritis    NECK  . Diabetes mellitus, type II ([place])    DIET CONTROLLED-NO MEDS  . Dysfunctional alcohol use    now quit  . GERD (gastroesophageal reflux disease)   . Hypertension   . Neuropathy    feet  . Pancreatitis   . Peripheral vascular disease ([place])   . Stress fracture of ankle    Past Surgical History:  Procedure Laterality Date  . ABDOMINAL AORTOGRAM W/LOWER EXTREMITY Bilateral 10/28/2019   Procedure: ABDOMINAL AORTOGRAM W/LOWER EXTREMITY;  Surgeon: Wellington Hampshire, MD;  Location: [facility] [place] [facility];  Service: Cardiovascular;  Laterality: Bilateral;  . CATARACT EXTRACTION W/PHACO Left 09/03/2019   Procedure: CATARACT EXTRACTION PHACO AND INTRAOCULAR LENS PLACEMENT (IOC) LEFT VISION BLUE 9.84 00:54.9 17.9%;  Surgeon: Marchia Meiers, MD;  Location: [place];  Service: Ophthalmology;  Laterality: Left;  Diabetic - diet controlled  . LOWER EXTREMITY ANGIOGRAM Bilateral 02/24/2014   Procedure: LOWER EXTREMITY ANGIOGRAM;  Surgeon: Wellington Hampshire, MD;  Location: [facility];  Service: Cardiovascular;  Laterality: Bilateral;  . PERIPHERAL VASCULAR CATHETERIZATION    . PERIPHERAL VASCULAR INTERVENTION  10/28/2019   Procedure: PERIPHERAL VASCULAR INTERVENTION;  Surgeon: Wellington Hampshire, MD;  Location: [facility] [facility];  Service: Cardiovascular;;  . UMBILICAL HERNIA REPAIR N/A 03/05/2017   Procedure: HERNIA  REPAIR UMBILICAL ADULT;  Surgeon: Leonie Green, MD;  Location: [facility];  Service: General;  Laterality: N/A;   Current Outpatient Medications on File Prior to Visit  Medication Sig Dispense Refill  . acetaminophen (TYLENOL) 500 MG tablet Take 1,000 mg by mouth every 6 (six) hours as needed for moderate pain.    Marland Kitchen amLODipine (NORVASC) 10 MG tablet TAKE 1 TABLET(10 MG) BY MOUTH DAILY 90 tablet 0  . aspirin 81 MG tablet Take 81 mg by mouth daily.    . Blood Glucose Monitoring Suppl (ACCU-CHEK AVIVA PLUS) w/Device KIT Check BS bid 1 kit 0  . gabapentin (NEURONTIN) 300 MG capsule Take 300 mg by mouth 4 (four) times daily.   0  . gentamicin cream (GARAMYCIN) 0.1 % Apply 1 application topically 3 (three) times daily. 30 g 1  . glucose blood (ACCU-CHEK AVIVA PLUS) test strip Check BS BID - new onset dm E11.9 100 each 12  . Lancets (ACCU-CHEK MULTICLIX) lancets Check BS bid - New onset DM E11.9 100 each 12  . metFORMIN (GLUCOPHAGE) 500 MG tablet TAKE 1 TABLET(500 MG) BY MOUTH TWICE DAILY WITH A MEAL 180 tablet 0  . pantoprazole (PROTONIX) 40 MG tablet Take 1 tablet (40 mg total) by mouth daily. (Patient taking differently: Take 40 mg by mouth daily as needed (acid reflux). ) 90 tablet 3  . predniSONE (DELTASONE) 50 MG tablet Take 1 tablet by mouth 13 hours prior to the procedure, 1 tablet 7 hours prior, 1 tablet before  leaving home. 3 tablet 0  . atorvastatin (LIPITOR) 40 MG tablet Take 1 tablet (40 mg total) by mouth daily. (Patient not taking: Reported on 11/06/2019) 30 tablet 6  . clopidogrel (PLAVIX) 75 MG tablet Take 1 tablet (75 mg total) by mouth daily with breakfast. (Patient not taking: Reported on 11/06/2019) 90 tablet 1  . empagliflozin (JARDIANCE) 10 MG TABS tablet Take 10 mg by mouth daily before breakfast. (Patient not taking: Reported on 11/06/2019) 30 tablet 0   Current Facility-Administered Medications on File Prior to Visit  Medication Dose Route Frequency Provider Last Rate Last  Admin  . sodium chloride flush (NS) 0.9 % injection 3 mL  3 mL Intravenous Q12H Wellington Hampshire, MD       Allergies  Allergen Reactions  . Iohexol      Code: HIVES, Desc: HIVES WITH OMNIPAQUE 300 OBSERVED BY DR Alvester Chou NO MEDS GIVEN    Social History   Socioeconomic History  . Marital status: Married    Spouse name: Not on file  . Number of children: 2  . Years of education: 16  . Highest education level: Not on file  Occupational History  . Occupation: self-employed  Tobacco Use  . Smoking status: Current Every Day Smoker    Packs/day: 0.50    Years: 40.00    Pack years: 20.00    Types: Cigarettes  . Smokeless tobacco: Never Used  Substance and Sexual Activity  . Alcohol use: Not Currently    Comment: quit around 2012  . Drug use: No  . Sexual activity: Not on file  Other Topics Concern  . Not on file  Social History Narrative  . Not on file   Social Determinants of Health   Financial Resource Strain:   . Difficulty of Paying Living Expenses:   Food Insecurity:   . Worried About Charity fundraiser in the Last Year:   . Arboriculturist in the Last Year:   Transportation Needs:   . Film/video editor (Medical):   Marland Kitchen Lack of Transportation (Non-Medical):   Physical Activity:   . Days of Exercise per Week:   . Minutes of Exercise per Session:   Stress:   . Feeling of Stress :   Social Connections:   . Frequency of Communication with Friends and Family:   . Frequency of Social Gatherings with Friends and Family:   . Attends Religious Services:   . Active Member of Clubs or Organizations:   . Attends Archivist Meetings:   Marland Kitchen Marital Status:   Intimate Partner Violence:   . Fear of Current or Ex-Partner:   . Emotionally Abused:   Marland Kitchen Physically Abused:   . Sexually Abused:       Review of Systems  All other systems reviewed and are negative.      Objective:   Physical Exam  Constitutional: He appears well-developed and well-nourished.   Neck: No JVD present.  Cardiovascular: Normal rate, regular rhythm and normal heart sounds.  Pulmonary/Chest: Effort normal. He has wheezes.  Abdominal: Soft. Bowel sounds are normal. He exhibits no distension. There is no abdominal tenderness. There is no rebound and no guarding.  Musculoskeletal:        General: No edema.     Cervical back: Neck supple.  Vitals reviewed.         Assessment & Plan:  Uncontrolled type 2 diabetes mellitus with peripheral circulatory disorder (HCC)  I spent 40 minutes today with the patient  explaining his diagnosis.  We discussed treatment options.  First I recommended increasing Metformin to 1000 mg twice daily.  Also recommended adding glipizide extended release 10 mg every morning.  Will likely need additional medication however I also recommended that he work on his diet.  Recommended less than 45 g of carbohydrates per meal.  Discussed food choices and how to determine what is a high carbohydrate containing food.  I asked the patient to check his fasting blood sugar and 2-hour postprandial sugars and then recheck with me in office visit in 2 weeks.  Goal fasting blood sugars are between 80 and 130.  Goal 2-hour postprandial sugars are between 80 and 160.  In 2 weeks if his sugars are not falling into that range I will add Jardiance.  Again spent more than 40 minutes today with the patient discussing treatment of his diabetes.  Recheck in 2 weeks.  Also counseled smoking cessation.

## 2019-11-08 LAB — GLUCOSE, CAPILLARY: Glucose-Capillary: 501 mg/dL (ref 70–99)

## 2019-11-09 ENCOUNTER — Telehealth: Payer: Self-pay

## 2019-11-09 DIAGNOSIS — I739 Peripheral vascular disease, unspecified: Secondary | ICD-10-CM

## 2019-11-09 NOTE — Telephone Encounter (Signed)
Attempted to schedule.  LMOV to call office.  ° °

## 2019-11-09 NOTE — Telephone Encounter (Signed)
-----   Message from Oscar Hampshire, MD sent at 11/09/2019  2:15 PM EDT ----- This patient had an iliac intervention earlier this month.  Please schedule him for an ABI and aortoiliac duplex as soon as available.

## 2019-11-12 ENCOUNTER — Encounter: Payer: Self-pay | Admitting: Family

## 2019-11-12 ENCOUNTER — Other Ambulatory Visit: Payer: Self-pay

## 2019-11-12 ENCOUNTER — Ambulatory Visit (INDEPENDENT_AMBULATORY_CARE_PROVIDER_SITE_OTHER): Payer: Medicare HMO | Admitting: Family

## 2019-11-12 VITALS — BP 130/60 | HR 75 | Ht 72.0 in | Wt 158.1 lb

## 2019-11-12 DIAGNOSIS — I739 Peripheral vascular disease, unspecified: Secondary | ICD-10-CM | POA: Diagnosis not present

## 2019-11-12 DIAGNOSIS — E1142 Type 2 diabetes mellitus with diabetic polyneuropathy: Secondary | ICD-10-CM

## 2019-11-12 DIAGNOSIS — E785 Hyperlipidemia, unspecified: Secondary | ICD-10-CM | POA: Diagnosis not present

## 2019-11-12 DIAGNOSIS — E1165 Type 2 diabetes mellitus with hyperglycemia: Secondary | ICD-10-CM

## 2019-11-12 DIAGNOSIS — Z720 Tobacco use: Secondary | ICD-10-CM

## 2019-11-12 DIAGNOSIS — I10 Essential (primary) hypertension: Secondary | ICD-10-CM | POA: Diagnosis not present

## 2019-11-12 DIAGNOSIS — E1151 Type 2 diabetes mellitus with diabetic peripheral angiopathy without gangrene: Secondary | ICD-10-CM | POA: Diagnosis not present

## 2019-11-12 MED ORDER — CLOPIDOGREL BISULFATE 75 MG PO TABS: 75.0000 mg | ORAL_TABLET | Freq: Every day | ORAL | 1 refills | Status: DC

## 2019-11-12 MED ORDER — ATORVASTATIN CALCIUM 40 MG PO TABS: 40.0000 mg | ORAL_TABLET | Freq: Every day | ORAL | 1 refills | Status: DC

## 2019-11-12 NOTE — Patient Instructions (Addendum)
Medication Instructions:   START Clopidogrel (Plavix) 75 mg daily to protect your stent  START Atorvastatin (Lipitor) for cholesterol and to prevent further blockages in your legs.  *If you need a refill on your cardiac medications before your next appointment, please call your pharmacy*   Lab Work: None ordered today.   If you have labs (blood work) drawn today and your tests are completely normal, you will receive your results only by: Marland Kitchen MyChart Message (if you have MyChart) OR . A paper copy in the mail If you have any lab test that is abnormal or we need to change your treatment, we will call you to review the results.   Testing/Procedures: EKG today was normal.   Vascular studies of your legs have been ordered for you after your recent stent in your leg. These will be scheduled at check out.   Follow-Up: At [facility], you and your health needs are our priority.  As part of our continuing mission to provide you with exceptional heart care, we have created designated Provider Care Teams.  These Care Teams include your primary Cardiologist (physician) and Advanced Practice Providers (APPs -  Physician Assistants and Nurse Practitioners) who all work together to provide you with the care you need, when you need it.  We recommend signing up for the patient portal called "MyChart".  Sign up information is provided on this After Visit Summary.  MyChart is used to connect with patients for Virtual Visits (Telemedicine).  Patients are able to view lab/test results, encounter notes, upcoming appointments, etc.  Non-urgent messages can be sent to your provider as well.   To learn more about what you can do with MyChart, go to [email or web address].    Your next appointment:   1 month(s)  The format for your next appointment:   In Person  Provider:   You may see [name], MD or one of the following Advanced Practice Providers on your designated Care Team:    Murray Hodgkins, NP  Christell Faith, PA-C  Marrianne Mood, PA-C  Other Instructions  Please take your medications as prescribed to protect the stents that were placed in your iliac arteries.  Stopping smoking is very important to prevent further blockages in your legs.  Your numbness in your toes is likely neuropathy due to your diabetes. Continue to follow with your primary care doctor. This is helped by your Gabapentin.

## 2019-11-12 NOTE — Progress Notes (Signed)
Office Visit    Patient Name: Oscar Mills Date of Encounter: 11/12/2019  Primary Care Provider:  Susy Frizzle, MD Primary Cardiologist:  [name], MD Electrophysiologist:  None   Chief Complaint    Oscar Mills is a 69 y.o. male with a hx of PAD, tobacco abuse, HTN presents today for follow-up after aortogram  Past Medical History    Past Medical History:  Diagnosis Date  . Arthritis    NECK  . Diabetes mellitus, type II ([place])   . Dysfunctional alcohol use    now quit  . GERD (gastroesophageal reflux disease)   . Hypertension   . Neuropathy    feet  . Pancreatitis   . Peripheral vascular disease ([place])   . Stress fracture of ankle    Past Surgical History:  Procedure Laterality Date  . ABDOMINAL AORTOGRAM W/LOWER EXTREMITY Bilateral 10/28/2019   Procedure: ABDOMINAL AORTOGRAM W/LOWER EXTREMITY;  Surgeon: Wellington Hampshire, MD;  Location: [place] [facility];  Service: Cardiovascular;  Laterality: Bilateral;  . CATARACT EXTRACTION W/PHACO Left 09/03/2019   Procedure: CATARACT EXTRACTION PHACO AND INTRAOCULAR LENS PLACEMENT (IOC) LEFT VISION BLUE 9.84 00:54.9 17.9%;  Surgeon: Marchia Meiers, MD;  Location: [place];  Service: Ophthalmology;  Laterality: Left;  Diabetic - diet controlled  . LOWER EXTREMITY ANGIOGRAM Bilateral 02/24/2014   Procedure: LOWER EXTREMITY ANGIOGRAM;  Surgeon: Wellington Hampshire, MD;  Location: [place] [facility];  Service: Cardiovascular;  Laterality: Bilateral;  . PERIPHERAL VASCULAR CATHETERIZATION    . PERIPHERAL VASCULAR INTERVENTION  10/28/2019   Procedure: PERIPHERAL VASCULAR INTERVENTION;  Surgeon: Wellington Hampshire, MD;  Location: [place] [facility];  Service: Cardiovascular;;  . UMBILICAL HERNIA REPAIR N/A 03/05/2017   Procedure: HERNIA REPAIR UMBILICAL ADULT;  Surgeon: Leonie Green, MD;  Location: [facility];  Service: General;  Laterality: N/A;    Allergies  Allergies  Allergen Reactions  . Iohexol    Code: HIVES, Desc: HIVES WITH OMNIPAQUE 300 OBSERVED BY DR Alvester Chou NO MEDS GIVEN     History of Present Illness    Oscar Mills is a 69 y.o. male with a hx of PAD, tobacco abuse, HTN, HLD, DM 2.  He was last seen for aortogram by Dr. Fletcher Anon..  He has known occlusion of the right SFA and proximal popliteal artery with right calf claudication.  Noninvasive vascular evaluation June 2015 with ABI of 0.44 on the right and 1.2 on the left.  He was treated medically with cilostazol with improvement.  Approximately March 2021 he developed worsening symptoms of claudication.  When seen in clinic 10/14/2019 by Dr. Fletcher Anon he was recommended for urgent aortogram due to absence of right femoral pulse.  Underwent abdominal aortogram with lower extremity runoff 10/28/2019.  No noted significant aortic stenosis.  RLE with significant disease to proximal external iliac artery, diffuse subocclusive disease and SFA with short occlusion in the mid segment with collaterals followed by a short segment occlusion of the popliteal artery above the knee and likely three-vessel runoff below the knee.  Severe left common iliac artery stenosis.  Underwent successful complex revascularization of right common iliac and external iliac as well as left common iliac artery with kissing stent placement extending into the distal aorta.  He was recommended for DAPT for at least 6 months and treatment of risk factors.  Reports numbness in his feet - discussed likely diagnosis of neuropathy. Reports leg pain has improved with no symptoms of claudication. Continues to smoke, plans  to reduce. Did not start a number of medications at discharge including Jardiance, Plavix, Atorvastatin. He is unclear why he is not taking them. His wife manages his medications who was not present during visit. Discussed necessity of Plavix and Atorvastatin along with management of DM2 and smoking cessation to prevent further PAD.   Was seen by PCP within the last  week who increased his Metformin to 1000mg  BID with plans to consider Jardiance after assessing response to increased Metformin.  EKGs/Labs/Other Studies Reviewed:   The following studies were reviewed today:  Peripheral vascular catheterization 10/28/19 1.  No significant aortic stenosis. 2.  Right lower extremity: Flush occlusion of the common iliac artery with significant disease in the proximal external iliac artery, diffuse subocclusive disease in the SFA with short occlusion in the mid segment with reconstitution via collaterals from the profunda followed by short segment occlusion of the popliteal artery above the knee and likely three-vessel runoff below the knee. 3.  Severe left common iliac artery stenosis.  The left leg was not fully imaged. 4.  Successful complex revascularization of right common iliac and external iliac as well as left common iliac artery with kissing stent placement extending into the distal aorta.   Recommendations: Dual antiplatelet therapy for at least 6 months. Aggressive treatment of risk factors. Suspect significant improvement in patient's right leg pain with this revascularization.  However, he has known chronically occluded right SFA and popliteal arteries and this can be addressed in the near future if he has residual symptoms. Observe overnight and possible discharge home tomorrow.  EKG:  EKG is ordered today.  The ekg ordered today demonstrates SR 75 bpm with sinus arrhythmia (PAC)  Recent Labs: 10/22/2019: ALT 22; BUN 26; Creatinine, Ser 0.65; Hemoglobin 16.4; Platelets 266; Potassium 4.4; Sodium 134  Recent Lipid Panel    Component Value Date/Time   CHOL 107 10/22/2019 1726   CHOL 94 (L) 05/14/2014 0829   TRIG 73 10/22/2019 1726   HDL 30 (L) 10/22/2019 1726   HDL 38 (L) 05/14/2014 0829   CHOLHDL 3.6 10/22/2019 1726   VLDL 15 10/22/2019 1726   LDLCALC 62 10/22/2019 1726   LDLCALC 36 05/14/2014 0829    Home Medications   No outpatient  medications have been marked as taking for the 11/12/19 encounter (Appointment) with [name], NP.   Current Facility-Administered Medications for the 11/12/19 encounter (Appointment) with [name], NP  Medication  . sodium chloride flush (NS) 0.9 % injection 3 mL      Review of Systems      Review of Systems  Constitution: Negative for chills, fever and malaise/fatigue.  Cardiovascular: Negative for chest pain, dyspnea on exertion, leg swelling, near-syncope, orthopnea, palpitations and syncope.  Respiratory: Negative for cough, shortness of breath and wheezing.   Gastrointestinal: Negative for nausea and vomiting.  Neurological: Positive for numbness (toes of right foot). Negative for dizziness, light-headedness and weakness.   All other systems reviewed and are otherwise negative except as noted above.  Physical Exam    VS:  There were no vitals taken for this visit. , BMI There is no height or weight on file to calculate BMI. GEN: Well nourished, well developed, in no acute distress. HEENT: normal. Neck: Supple, no JVD, carotid bruits, or masses. Cardiac: RRR, no murmurs, rubs, or gallops. No clubbing, cyanosis, edema.  Radials 2+ and equal bilaterally. PT 1+ and equal bilaterally. Bilateral femoral pulses palpable.  Respiratory:  Respirations regular and unlabored, clear to auscultation  bilaterally. GI: Soft, nontender, nondistended, BS + x 4. MS: No deformity or atrophy. Skin: Warm and dry, no rash. Neuro:  Strength and sensation are intact. Psych: Normal affect.  Assessment & Plan    1. PAD - s/p recent complex revascularization of right common iliac and externa liliac as well as left common iliac artery with kissing stent placement 10/28/19. Recommended for DAPT x6 mos. Did not start Plavix after hospitalization for unclear reason. Rx provided for Plavix and discussed necessity for protection of stent. Continue aspirin 81mg  daily. Previously ordered follow up  ABI/duplex were scheduled today.  2. Neuropathy - endorse toes of right foot are numb. Decreased sensation to dorsum of foot on exam. Likely peripheral neuropathy in setting of uncontrolled DM2. On gabapentin, encouraged to discuss with PCP.  3. HTN - BP well controlled. Continue present antihypertensive regimen.  4. HLD, LDL goal less than 70 - Did not start Atorvastatin 80mg  daily prescribed in hospital for unclear reason. Start Atorvastatin 80mg  daily. PLan for repeat lipid/liver in 6-8 weeks. Discussed necessity of lipid control.  5. Tobacco abuse - smoking 0.5 PPD. Discussed necessity of smoking cessation in PAD. Smoking cessation encouraged. Recommend utilization of [id number]. 6. DM2 - New diagnosis. A1c 12. Following with primary care. If additional agent needed consider jardiance for cardioprotective benefit.    Disposition: Follow up in 4 week(s) with Dr. Fletcher Anon or APP to ensure medication compliance and for follow up after ABI/duplex   [name], NP 11/12/2019, 1:10 PM

## 2019-11-13 NOTE — Telephone Encounter (Signed)
Attempted to schedule .  No ans home or cell   Will attempt again on Monday early am for a 1030 pv opening   Patient will be fasting .

## 2019-11-16 ENCOUNTER — Encounter: Payer: Self-pay | Admitting: Cardiovascular Disease

## 2019-11-16 NOTE — Telephone Encounter (Addendum)
Oscar Hampshire, MD  [name], NP; [name], RN; [name]  Is there a reason his vascular studies are not scheduled until the end of June? This should be expedited and should be done next week given that his procedure was in early May. Follow-up Doppler studies should be done within 2 to 3 weeks of revascularization. I added Lattie Haw and Dominica Severin on this message . thanks.

## 2019-11-16 NOTE — Telephone Encounter (Signed)
Attempted to schedule.  LMOV to call office.    Unable to reach x 3 .Mailing letter.

## 2019-11-18 ENCOUNTER — Ambulatory Visit (INDEPENDENT_AMBULATORY_CARE_PROVIDER_SITE_OTHER): Payer: Medicare HMO

## 2019-11-18 ENCOUNTER — Other Ambulatory Visit: Payer: Self-pay

## 2019-11-18 ENCOUNTER — Other Ambulatory Visit: Payer: Self-pay | Admitting: Cardiovascular Disease

## 2019-11-18 DIAGNOSIS — I739 Peripheral vascular disease, unspecified: Secondary | ICD-10-CM | POA: Diagnosis not present

## 2019-11-18 NOTE — Telephone Encounter (Signed)
Attempted to schedule from waitlist today. No ans on cell  Spoke with wife and she will reach out to the patient   Holding open slot today until 10 am for ABI

## 2019-11-18 NOTE — Telephone Encounter (Signed)
Patients PV test scheduled for 11/18/19

## 2019-11-24 ENCOUNTER — Telehealth: Payer: Self-pay

## 2019-11-24 NOTE — Telephone Encounter (Signed)
-----   Message from Wellington Hampshire, MD sent at 11/23/2019 11:34 AM EDT ----- Right iliac stent is patent.  His right ABI continues to be very low given his occluded right SFA and popliteal arteries.  His next follow-up appointment should be with me in few weeks to see if further intervention is needed.

## 2019-11-24 NOTE — Telephone Encounter (Signed)
Called to give the patient LE study results and Dr. Tyrell Antonio recommendation.  DPR on file Lmom with results. Patient needs to contact the office so that his 6/22 appt with CW and can be rescheduled with Dr. Fletcher Anon.

## 2019-11-25 NOTE — Telephone Encounter (Signed)
Patient spouse calling  It appears patient is to be scheduled with Dr Fletcher Anon but Dr Fletcher Anon does not have anything available within a few weeks time frame Are there any suggestions on where we can place patient? Patient does not have any availability on June 11th or July 1st Per spouse, they will be unavailable the rest of the day but ok to schedule and leave message with date and time Please advise

## 2019-11-25 NOTE — Telephone Encounter (Signed)
Possibly  6/8 @ 1:20 pm 6/22 @ 1:20 pm 6/29 @ 1:20pm

## 2019-11-25 NOTE — Telephone Encounter (Signed)
Scheduled for 06/08 at 1:20p and [place] with appointment change Advised if they need another day or time to give [place] a call

## 2019-12-01 ENCOUNTER — Ambulatory Visit: Payer: Medicare HMO | Admitting: Cardiovascular Disease

## 2019-12-01 ENCOUNTER — Other Ambulatory Visit: Payer: Self-pay

## 2019-12-01 ENCOUNTER — Encounter: Payer: Self-pay | Admitting: Cardiovascular Disease

## 2019-12-01 VITALS — BP 108/60 | HR 67 | Ht 74.0 in | Wt 161.1 lb

## 2019-12-01 DIAGNOSIS — E785 Hyperlipidemia, unspecified: Secondary | ICD-10-CM | POA: Diagnosis not present

## 2019-12-01 DIAGNOSIS — Z720 Tobacco use: Secondary | ICD-10-CM | POA: Diagnosis not present

## 2019-12-01 DIAGNOSIS — I739 Peripheral vascular disease, unspecified: Secondary | ICD-10-CM

## 2019-12-01 DIAGNOSIS — I10 Essential (primary) hypertension: Secondary | ICD-10-CM

## 2019-12-01 MED ORDER — ATORVASTATIN CALCIUM 40 MG PO TABS
40.0000 mg | ORAL_TABLET | Freq: Every day | ORAL | 3 refills | Status: DC
Start: 2019-12-01 — End: 2020-06-02

## 2019-12-01 NOTE — Patient Instructions (Signed)
Medication Instructions:  Your physician has recommended you make the following change in your medication:   RESUME Atorvastatin 40 mg daily. An Rx has been sent to your pharmacy.  *If you need a refill on your cardiac medications before your next appointment, please call your pharmacy*   Lab Work: None ordered If you have labs (blood work) drawn today and your tests are completely normal, you will receive your results only by: Marland Kitchen MyChart Message (if you have MyChart) OR . A paper copy in the mail If you have any lab test that is abnormal or we need to change your treatment, we will call you to review the results.   Testing/Procedures: Your physician has requested that you have a lower extremity arterial exercise duplex. During this test, exercise and ultrasound are used to evaluate arterial blood flow in the legs. Allow one hour for this exam. There are no restrictions or special instructions. (To be scheduled in November 2021)  Your physician has requested that you have an ankle brachial index (ABI). During this test an ultrasound and blood pressure cuff are used to evaluate the arteries that supply the arms and legs with blood. Allow thirty minutes for this exam. There are no restrictions or special instructions. (To be scheduled in November 2021)    Follow-Up: At [facility], you and your health needs are our priority.  As part of our continuing mission to provide you with exceptional heart care, we have created designated Provider Care Teams.  These Care Teams include your primary Cardiologist (physician) and Advanced Practice Providers (APPs -  Physician Assistants and Nurse Practitioners) who all work together to provide you with the care you need, when you need it.  We recommend signing up for the patient portal called "MyChart".  Sign up information is provided on this After Visit Summary.  MyChart is used to connect with patients for Virtual Visits (Telemedicine).  Patients  are able to view lab/test results, encounter notes, upcoming appointments, etc.  Non-urgent messages can be sent to your provider as well.   To learn more about what you can do with MyChart, go to [email or web address].    Your next appointment:   6 month(s)  The format for your next appointment:   In Person  Provider:    You may see [name], MD or one of the following Advanced Practice Providers on your designated Care Team:    Murray Hodgkins, NP  Christell Faith, PA-C  Marrianne Mood, PA-C    Other Instructions N/A

## 2019-12-01 NOTE — Progress Notes (Signed)
Cardiology Office Note   Date:  12/01/2019   ID:  Oscar Mills, DOB 1950-07-23, MRN [id number]  PCP:  Susy Frizzle, MD  Cardiologist:   [name], MD   Chief Complaint  Patient presents with  . OTHER    1 month f/u right leg pain. Meds reviewed verbally with pt.      History of Present Illness: Oscar Mills is a 70 y.o. male who presents for a followup visit regarding peripheral arterial disease. He is a smoker with hypertension and a family history of heart disease. He has known occlusion of the right SFA and proximal popliteal artery with right calf claudication. Noninvasive vascular evaluation in June 2015 indicated an ABI of 0.44 on the right and 1.2 on the left.  He was treated medically with cilostazol with improvement in symptoms.   He was seen recently for rest pain affecting the right lower extremity.  He was suspected of having iliac occlusion given inability to palpate right femoral pulse.  He was also diagnosed around that time with type 2 diabetes. I proceeded with angiography last month which showed flush occlusion of the right common iliac artery with significant disease in the proximal external iliac artery, diffuse subocclusive disease in the SFA with short occlusion in the midsegment and another occlusion of the popliteal artery with three-vessel runoff below the knee.  On the left side there was severe left common iliac artery stenosis.  I performed successful complex revascularization of the right common iliac artery and external iliac artery as well as left common iliac artery with kissing stent placement bilaterally extending into the distal aorta. Rest pain resolved with significant improvement of right leg claudication.  His symptoms are back to his baseline.  Post procedure vascular testing showed an ABI of 0.47 on the right and 0.97 on the left.  Duplex showed patent bilateral common iliac artery stents.  Past Medical History:  Diagnosis  Date  . Arthritis    NECK  . Diabetes mellitus, type II ([place])   . Dysfunctional alcohol use    now quit  . GERD (gastroesophageal reflux disease)   . Hypertension   . Neuropathy    feet  . Pancreatitis   . Peripheral vascular disease ([place])   . Stress fracture of ankle     Past Surgical History:  Procedure Laterality Date  . ABDOMINAL AORTOGRAM W/LOWER EXTREMITY Bilateral 10/28/2019   Procedure: ABDOMINAL AORTOGRAM W/LOWER EXTREMITY;  Surgeon: Wellington Hampshire, MD;  Location: [facility];  Service: Cardiovascular;  Laterality: Bilateral;  . CATARACT EXTRACTION W/PHACO Left 09/03/2019   Procedure: CATARACT EXTRACTION PHACO AND INTRAOCULAR LENS PLACEMENT (IOC) LEFT VISION BLUE 9.84 00:54.9 17.9%;  Surgeon: Marchia Meiers, MD;  Location: [facility];  Service: Ophthalmology;  Laterality: Left;  Diabetic - diet controlled  . LOWER EXTREMITY ANGIOGRAM Bilateral 02/24/2014   Procedure: LOWER EXTREMITY ANGIOGRAM;  Surgeon: Wellington Hampshire, MD;  Location: [facility];  Service: Cardiovascular;  Laterality: Bilateral;  . PERIPHERAL VASCULAR CATHETERIZATION    . PERIPHERAL VASCULAR INTERVENTION  10/28/2019   Procedure: PERIPHERAL VASCULAR INTERVENTION;  Surgeon: Wellington Hampshire, MD;  Location: [facility];  Service: Cardiovascular;;  . UMBILICAL HERNIA REPAIR N/A 03/05/2017   Procedure: HERNIA REPAIR UMBILICAL ADULT;  Surgeon: Leonie Green, MD;  Location: [facility];  Service: General;  Laterality: N/A;     Current Outpatient Medications  Medication Sig Dispense Refill  . acetaminophen (TYLENOL) 500 MG tablet Take  1,000 mg by mouth every 6 (six) hours as needed for moderate pain.    Marland Kitchen amLODipine (NORVASC) 10 MG tablet TAKE 1 TABLET(10 MG) BY MOUTH DAILY 90 tablet 0  . aspirin 81 MG tablet Take 81 mg by mouth daily.    . Blood Glucose Monitoring Suppl (ACCU-CHEK AVIVA PLUS) w/Device KIT Check BS bid 1 kit 0  . clopidogrel (PLAVIX) 75 MG tablet Take 1 tablet (75 mg  total) by mouth daily with breakfast. 90 tablet 1  . gabapentin (NEURONTIN) 300 MG capsule Take 300 mg by mouth as needed.   0  . glipiZIDE (GLIPIZIDE XL) 10 MG 24 hr tablet Take 1 tablet (10 mg total) by mouth daily with breakfast. 30 tablet 3  . glucose blood (ACCU-CHEK AVIVA PLUS) test strip Check BS BID - new onset dm E11.9 100 each 12  . Lancets (ACCU-CHEK MULTICLIX) lancets Check BS bid - New onset DM E11.9 100 each 12  . metFORMIN (GLUCOPHAGE) 500 MG tablet Take 2 tablets (1,000 mg total) by mouth 2 (two) times daily with a meal. 120 tablet 3  . pantoprazole (PROTONIX) 40 MG tablet Take 1 tablet (40 mg total) by mouth daily. (Patient taking differently: Take 40 mg by mouth daily as needed (acid reflux). ) 90 tablet 3   No current facility-administered medications for this visit.    Allergies:   Iohexol    Social History:  The patient  reports that he has been smoking cigarettes. He has a 20.00 pack-year smoking history. He has never used smokeless tobacco. He reports previous alcohol use. He reports that he does not use drugs.   Family History:  The patient's family history includes Heart attack in his father.    ROS:  Please see the history of present illness.   Otherwise, review of systems are positive for none.   All other systems are reviewed and negative.    PHYSICAL EXAM: VS:  BP 108/60 (BP Location: Left Arm, Patient Position: Sitting, Cuff Size: Normal)   Pulse 67   Ht _0  (1.88 m)   Wt 161 lb 2 oz (73.1 kg)   SpO2 98%   BMI 20.69 kg/m  , BMI Body mass index is 20.69 kg/m. GEN: Well nourished, well developed, in no acute distress  HEENT: normal  Neck: no JVD, carotid bruits, or masses Cardiac: RRR; no murmurs, rubs, or gallops,no edema  Respiratory:  clear to auscultation bilaterally, normal work of breathing GI: soft, nontender, nondistended, + BS MS: no deformity or atrophy  Skin: warm and dry, no rash Neuro:  Strength and sensation are intact Psych:  euthymic mood, full affect Vascular: Femoral pulses: Slightly diminished bilaterally.   Distal pulses are palpable on the left side only.     EKG:  EKG is ordered today. The ekg ordered today demonstrates normal sinus rhythm with short PR interval.   Recent Labs: 10/22/2019: ALT 22; BUN 26; Creatinine, Ser 0.65; Hemoglobin 16.4; Platelets 266; Potassium 4.4; Sodium 134    Lipid Panel    Component Value Date/Time   CHOL 107 10/22/2019 1726   CHOL 94 (L) 05/14/2014 0829   TRIG 73 10/22/2019 1726   HDL 30 (L) 10/22/2019 1726   HDL 38 (L) 05/14/2014 0829   CHOLHDL 3.6 10/22/2019 1726   VLDL 15 10/22/2019 1726   LDLCALC 62 10/22/2019 1726   LDLCALC 36 05/14/2014 0829      Wt Readings from Last 3 Encounters:  12/01/19 161 lb 2 oz (73.1 kg)  11/12/19  158 lb 2 oz (71.7 kg)  11/06/19 158 lb (71.7 kg)       No flowsheet data found.    ASSESSMENT AND PLAN:  1.  Peripheral arterial disease with claudication: The patient had rest pain recently which was due to occlusion of the right common iliac artery.  His symptoms improved significantly post revascularization.  I am planning to keep him on long-term dual antiplatelet therapy.  He continues to have moderate right calf claudication but reports that this is not lifestyle limiting.   Repeat aortoiliac duplex and ABI in November.  2. Essential hypertension: Blood pressure is reasonably controlled on current medications  3. Hyperlipidemia: He does not know why he is no longer taking atorvastatin although lipid profile in April did show an LDL of 62.  I resumed atorvastatin 40 mg daily  4. Tobacco use: I again discussed the importance of smoking cessation.  5.  Type 2 diabetes: Currently on Metformin and being managed by his primary care physician.   Disposition:   FU with me in 6 months  Signed,  [name], MD  12/01/2019 1:25 PM    [facility]

## 2019-12-15 ENCOUNTER — Ambulatory Visit: Payer: Medicare HMO | Admitting: Family

## 2019-12-24 DIAGNOSIS — M5442 Lumbago with sciatica, left side: Secondary | ICD-10-CM | POA: Diagnosis not present

## 2019-12-24 DIAGNOSIS — M545 Low back pain: Secondary | ICD-10-CM | POA: Diagnosis not present

## 2019-12-24 DIAGNOSIS — R201 Hypoesthesia of skin: Secondary | ICD-10-CM | POA: Diagnosis not present

## 2019-12-24 DIAGNOSIS — G2581 Restless legs syndrome: Secondary | ICD-10-CM | POA: Diagnosis not present

## 2019-12-24 DIAGNOSIS — M25551 Pain in right hip: Secondary | ICD-10-CM | POA: Diagnosis not present

## 2020-01-29 ENCOUNTER — Other Ambulatory Visit: Payer: Self-pay | Admitting: Cardiovascular Disease

## 2020-02-04 ENCOUNTER — Other Ambulatory Visit: Payer: Self-pay | Admitting: Cardiovascular Disease

## 2020-02-09 ENCOUNTER — Other Ambulatory Visit: Payer: Self-pay | Admitting: Family Medicine

## 2020-02-24 ENCOUNTER — Other Ambulatory Visit: Payer: Self-pay | Admitting: Cardiovascular Disease

## 2020-03-03 DIAGNOSIS — M545 Low back pain: Secondary | ICD-10-CM | POA: Diagnosis not present

## 2020-03-03 DIAGNOSIS — R201 Hypoesthesia of skin: Secondary | ICD-10-CM | POA: Diagnosis not present

## 2020-03-03 DIAGNOSIS — Z760 Encounter for issue of repeat prescription: Secondary | ICD-10-CM | POA: Diagnosis not present

## 2020-03-03 DIAGNOSIS — G2581 Restless legs syndrome: Secondary | ICD-10-CM | POA: Diagnosis not present

## 2020-04-26 ENCOUNTER — Other Ambulatory Visit: Payer: Self-pay | Admitting: Family Medicine

## 2020-04-28 DIAGNOSIS — Z79899 Other long term (current) drug therapy: Secondary | ICD-10-CM | POA: Diagnosis not present

## 2020-04-28 DIAGNOSIS — M545 Low back pain, unspecified: Secondary | ICD-10-CM | POA: Diagnosis not present

## 2020-04-28 DIAGNOSIS — G2581 Restless legs syndrome: Secondary | ICD-10-CM | POA: Diagnosis not present

## 2020-05-02 ENCOUNTER — Other Ambulatory Visit: Payer: Self-pay | Admitting: Cardiovascular Disease

## 2020-05-02 ENCOUNTER — Ambulatory Visit (INDEPENDENT_AMBULATORY_CARE_PROVIDER_SITE_OTHER): Payer: Medicare HMO

## 2020-05-02 ENCOUNTER — Other Ambulatory Visit: Payer: Self-pay

## 2020-05-02 ENCOUNTER — Ambulatory Visit: Payer: Medicare HMO

## 2020-05-02 DIAGNOSIS — Z95828 Presence of other vascular implants and grafts: Secondary | ICD-10-CM

## 2020-05-02 DIAGNOSIS — I739 Peripheral vascular disease, unspecified: Secondary | ICD-10-CM

## 2020-05-02 NOTE — Telephone Encounter (Signed)
*  STAT* If patient is at the pharmacy, call can be transferred to refill team.   1. Which medications need to be refilled? (please list name of each medication and dose if known) Metformin  2. Which pharmacy/location (including street and city if local pharmacy) is medication to be sent to?[place]  3. Do they need a 30 day or 90 day supply? [place]

## 2020-05-05 ENCOUNTER — Telehealth: Payer: Self-pay | Admitting: Cardiovascular Disease

## 2020-05-05 NOTE — Telephone Encounter (Signed)
Patient is returning call to discuss  AORTA/IVC/ILIACS results.

## 2020-05-05 NOTE — Telephone Encounter (Signed)
Patient made aware of results and verbalized understanding.  Stable ABI with patent iliac stents. However, there is significant narrowing on the right side. Keep follow up to discuss.

## 2020-05-22 ENCOUNTER — Other Ambulatory Visit: Payer: Self-pay | Admitting: Cardiovascular Disease

## 2020-06-02 ENCOUNTER — Other Ambulatory Visit: Payer: Self-pay | Admitting: Family Medicine

## 2020-06-02 ENCOUNTER — Other Ambulatory Visit: Payer: Self-pay

## 2020-06-02 ENCOUNTER — Other Ambulatory Visit: Payer: Self-pay | Admitting: Cardiovascular Disease

## 2020-06-02 ENCOUNTER — Ambulatory Visit: Payer: Medicare HMO | Admitting: Cardiovascular Disease

## 2020-06-02 ENCOUNTER — Encounter: Payer: Self-pay | Admitting: Cardiovascular Disease

## 2020-06-02 VITALS — BP 144/60 | HR 68 | Ht 74.0 in | Wt 171.2 lb

## 2020-06-02 DIAGNOSIS — I739 Peripheral vascular disease, unspecified: Secondary | ICD-10-CM

## 2020-06-02 DIAGNOSIS — Z720 Tobacco use: Secondary | ICD-10-CM

## 2020-06-02 DIAGNOSIS — I10 Essential (primary) hypertension: Secondary | ICD-10-CM

## 2020-06-02 DIAGNOSIS — E785 Hyperlipidemia, unspecified: Secondary | ICD-10-CM

## 2020-06-02 MED ORDER — ROSUVASTATIN CALCIUM 10 MG PO TABS: 10.0000 mg | ORAL_TABLET | Freq: Every day | ORAL | 11 refills | Status: DC

## 2020-06-02 NOTE — H&P (View-Only) (Signed)
Cardiology Office Note   Date:  06/02/2020   ID:  Oscar Mills, DOB 05-12-1951, MRN [id number]  PCP:  Susy Frizzle, MD  Cardiologist:   [name], MD   Chief Complaint  Patient presents with  . Other    F/u ABI/Aorta c/o right foot pain. Meds reviewed verbally with pt.      History of Present Illness: Oscar Mills is a 69 y.o. male who presents for a followup visit regarding peripheral arterial disease. He is a smoker with hypertension and a family history of heart disease. He has known occlusion of the right SFA and proximal popliteal artery. Noninvasive vascular evaluation in June 2015 indicated an ABI of 0.44 on the right and 1.2 on the left.  He was treated medically with cilostazol with improvement in symptoms.   He was seen in April of this year for rest pain affecting the right lower extremity.  He was suspected of having iliac occlusion given inability to palpate right femoral pulse.  He was also diagnosed around that time with type 2 diabetes. Angiography in May showed flush occlusion of the right common iliac artery with significant disease in the proximal external iliac artery, diffuse subocclusive disease in the SFA with short occlusion in the midsegment and another occlusion of the popliteal artery with three-vessel runoff below the knee.  On the left side there was severe left common iliac artery stenosis.  I performed successful complex revascularization of the right common iliac artery and external iliac artery as well as left common iliac artery with kissing stent placement bilaterally extending into the distal aorta.  He has been doing well with no recent chest pain, shortness of breath or palpitations.  He underwent repeat vascular studies in November which showed an ABI of 0.56 on the right and 0.99 on the left.  Duplex showed severe stenosis in the right mid external iliac artery with peak velocity of 607.  Unfortunately, report recurrent right  leg claudication which is happening with minimal exertion at the present time.  No rest pain or lower extremity ulceration.   Past Medical History:  Diagnosis Date  . Arthritis    NECK  . Diabetes mellitus, type II ([place])   . Dysfunctional alcohol use    now quit  . GERD (gastroesophageal reflux disease)   . Hypertension   . Neuropathy    feet  . Pancreatitis   . Peripheral vascular disease ([place])   . Stress fracture of ankle     Past Surgical History:  Procedure Laterality Date  . ABDOMINAL AORTOGRAM W/LOWER EXTREMITY Bilateral 10/28/2019   Procedure: ABDOMINAL AORTOGRAM W/LOWER EXTREMITY;  Surgeon: Wellington Hampshire, MD;  Location: [facility];  Service: Cardiovascular;  Laterality: Bilateral;  . CATARACT EXTRACTION W/PHACO Left 09/03/2019   Procedure: CATARACT EXTRACTION PHACO AND INTRAOCULAR LENS PLACEMENT (IOC) LEFT VISION BLUE 9.84 00:54.9 17.9%;  Surgeon: Marchia Meiers, MD;  Location: [facility];  Service: Ophthalmology;  Laterality: Left;  Diabetic - diet controlled  . LOWER EXTREMITY ANGIOGRAM Bilateral 02/24/2014   Procedure: LOWER EXTREMITY ANGIOGRAM;  Surgeon: Wellington Hampshire, MD;  Location: [facility];  Service: Cardiovascular;  Laterality: Bilateral;  . PERIPHERAL VASCULAR CATHETERIZATION    . PERIPHERAL VASCULAR INTERVENTION  10/28/2019   Procedure: PERIPHERAL VASCULAR INTERVENTION;  Surgeon: Wellington Hampshire, MD;  Location: [facility];  Service: Cardiovascular;;  . UMBILICAL HERNIA REPAIR N/A 03/05/2017   Procedure: HERNIA REPAIR UMBILICAL ADULT;  Surgeon: Leodis Binet  Lavone Neri, MD;  Location: [facility];  Service: General;  Laterality: N/A;     Current Outpatient Medications  Medication Sig Dispense Refill  . acetaminophen (TYLENOL) 500 MG tablet Take 1,000 mg by mouth every 6 (six) hours as needed for moderate pain.    Marland Kitchen amLODipine (NORVASC) 10 MG tablet TAKE 1 TABLET(10 MG) BY MOUTH DAILY 90 tablet 1  . aspirin 81 MG tablet Take 81 mg by mouth  daily.    . Blood Glucose Monitoring Suppl (ACCU-CHEK AVIVA PLUS) w/Device KIT Check BS bid 1 kit 0  . clopidogrel (PLAVIX) 75 MG tablet Take 1 tablet (75 mg total) by mouth daily with breakfast. 90 tablet 1  . gabapentin (NEURONTIN) 300 MG capsule Take 300 mg by mouth as needed.   0  . glipiZIDE (GLUCOTROL XL) 10 MG 24 hr tablet TAKE 1 TABLET(10 MG) BY MOUTH DAILY WITH BREAKFAST 30 tablet 3  . glucose blood (ACCU-CHEK AVIVA PLUS) test strip Check BS BID - new onset dm E11.9 100 each 12  . Lancets (ACCU-CHEK MULTICLIX) lancets Check BS bid - New onset DM E11.9 100 each 12  . metFORMIN (GLUCOPHAGE) 500 MG tablet Take 2 tablets (1,000 mg total) by mouth 2 (two) times daily with a meal. 120 tablet 3  . pantoprazole (PROTONIX) 40 MG tablet TAKE 1 TABLET BY MOUTH EVERY DAY 30 tablet 0   No current facility-administered medications for this visit.    Allergies:   Iohexol    Social History:  The patient  reports that he has been smoking cigarettes. He has a 20.00 pack-year smoking history. He has never used smokeless tobacco. He reports previous alcohol use. He reports that he does not use drugs.   Family History:  The patient's family history includes Heart attack in his father.    ROS:  Please see the history of present illness.   Otherwise, review of systems are positive for none.   All other systems are reviewed and negative.    PHYSICAL EXAM: VS:  BP (!) 144/60 (BP Location: Left Arm, Patient Position: Sitting, Cuff Size: Normal)   Pulse 68   Ht 6' 2"  (1.88 m)   Wt 171 lb 4 oz (77.7 kg)   SpO2 98%   BMI 21.99 kg/m  , BMI Body mass index is 21.99 kg/m. GEN: Well nourished, well developed, in no acute distress  HEENT: normal  Neck: no JVD, carotid bruits, or masses Cardiac: RRR; no murmurs, rubs, or gallops,no edema  Respiratory:  clear to auscultation bilaterally, normal work of breathing GI: soft, nontender, nondistended, + BS MS: no deformity or atrophy  Skin: warm and dry, no  rash Neuro:  Strength and sensation are intact Psych: euthymic mood, full affect Vascular: Femoral pulse: +1 on the right and +2 on the left.  Distal pulses are +2 on the left and not palpable on the right.   EKG:  EKG is ordered today. The ekg ordered today demonstrates normal sinus rhythm with PACs.   Recent Labs: 10/22/2019: ALT 22; BUN 26; Creatinine, Ser 0.65; Hemoglobin 16.4; Platelets 266; Potassium 4.4; Sodium 134    Lipid Panel    Component Value Date/Time   CHOL 107 10/22/2019 1726   CHOL 94 (L) 05/14/2014 0829   TRIG 73 10/22/2019 1726   HDL 30 (L) 10/22/2019 1726   HDL 38 (L) 05/14/2014 0829   CHOLHDL 3.6 10/22/2019 1726   VLDL 15 10/22/2019 1726   LDLCALC 62 10/22/2019 1726   LDLCALC 36 05/14/2014 0829  Wt Readings from Last 3 Encounters:  06/02/20 171 lb 4 oz (77.7 kg)  12/01/19 161 lb 2 oz (73.1 kg)  11/12/19 158 lb 2 oz (71.7 kg)       No flowsheet data found.    ASSESSMENT AND PLAN:  1.  Peripheral arterial disease with claudication: Status post complex revascularization of his iliac arteries in May of this year.  Unfortunately, he has now recurrent right leg claudication with evidence of severe stenosis in the right external iliac artery which threatens the patency of the right iliac stents.  Given his symptoms and duplex findings, I recommend proceeding with angiography and possible endovascular intervention.  I discussed the procedure in details as well as risk and benefits.  Planned access can be via the right common femoral artery or radial artery.  2. Essential hypertension: Blood pressure is reasonably controlled on current medications  3. Hyperlipidemia: He reports stopping atorvastatin because it bothered his stomach.  I discussed with him the importance of taking a statin medication given his extensive peripheral arterial disease.  I elected to start him on rosuvastatin 10 mg daily instead.  4. Tobacco use: I again discussed the  importance of smoking cessation.  5.  Type 2 diabetes: Managed by his primary care physician.   Disposition:   FU with me in 2 months  Signed,  [name], MD  06/02/2020 3:53 PM    [place]

## 2020-06-02 NOTE — Progress Notes (Signed)
Cardiology Office Note   Date:  06/02/2020   ID:  Oscar Mills, DOB [birth year], MRN [id number]  PCP:  Oscar Frizzle, MD  Cardiologist:   Oscar Sacramento, MD   Chief Complaint  Patient presents with  . Other    F/u ABI/Aorta c/o right foot pain. Meds reviewed verbally with pt.      History of Present Illness: Oscar Mills is a 69 y.o. male who presents for a followup visit regarding peripheral arterial disease. He is a smoker with hypertension and a family history of heart disease. He has known occlusion of the right SFA and proximal popliteal artery. Noninvasive vascular evaluation in June 2015 indicated an ABI of 0.44 on the right and 1.2 on the left.  He was treated medically with cilostazol with improvement in symptoms.   He was seen in April of this year for rest pain affecting the right lower extremity.  He was suspected of having iliac occlusion given inability to palpate right femoral pulse.  He was also diagnosed around that time with type 2 diabetes. Angiography in May showed flush occlusion of the right common iliac artery with significant disease in the proximal external iliac artery, diffuse subocclusive disease in the SFA with short occlusion in the midsegment and another occlusion of the popliteal artery with three-vessel runoff below the knee.  On the left side there was severe left common iliac artery stenosis.  I performed successful complex revascularization of the right common iliac artery and external iliac artery as well as left common iliac artery with kissing stent placement bilaterally extending into the distal aorta.  He has been doing well with no recent chest pain, shortness of breath or palpitations.  He underwent repeat vascular studies in November which showed an ABI of 0.56 on the right and 0.99 on the left.  Duplex showed severe stenosis in the right mid external iliac artery with peak velocity of 607.  Unfortunately, report recurrent right  leg claudication which is happening with minimal exertion at the present time.  No rest pain or lower extremity ulceration.   Past Medical History:  Diagnosis Date  . Arthritis    NECK  . Diabetes mellitus, type II ([place])   . Dysfunctional alcohol use    now quit  . GERD (gastroesophageal reflux disease)   . Hypertension   . Neuropathy    feet  . Pancreatitis   . Peripheral vascular disease ([place])   . Stress fracture of ankle     Past Surgical History:  Procedure Laterality Date  . ABDOMINAL AORTOGRAM W/LOWER EXTREMITY Bilateral 10/28/2019   Procedure: ABDOMINAL AORTOGRAM W/LOWER EXTREMITY;  Surgeon: Oscar Hampshire, MD;  Location: [facility];  Service: Cardiovascular;  Laterality: Bilateral;  . CATARACT EXTRACTION W/PHACO Left 09/03/2019   Procedure: CATARACT EXTRACTION PHACO AND INTRAOCULAR LENS PLACEMENT (IOC) LEFT VISION BLUE 9.84 00:54.9 17.9%;  Surgeon: Oscar Meiers, MD;  Location: [facility];  Service: Ophthalmology;  Laterality: Left;  Diabetic - diet controlled  . LOWER EXTREMITY ANGIOGRAM Bilateral 02/24/2014   Procedure: LOWER EXTREMITY ANGIOGRAM;  Surgeon: Oscar Hampshire, MD;  Location: [facility];  Service: Cardiovascular;  Laterality: Bilateral;  . PERIPHERAL VASCULAR CATHETERIZATION    . PERIPHERAL VASCULAR INTERVENTION  10/28/2019   Procedure: PERIPHERAL VASCULAR INTERVENTION;  Surgeon: Oscar Hampshire, MD;  Location: [facility];  Service: Cardiovascular;;  . UMBILICAL HERNIA REPAIR N/A 03/05/2017   Procedure: HERNIA REPAIR UMBILICAL ADULT;  Surgeon: Oscar Binet  Lavone Neri, MD;  Location: [facility];  Service: General;  Laterality: N/A;     Current Outpatient Medications  Medication Sig Dispense Refill  . acetaminophen (TYLENOL) 500 MG tablet Take 1,000 mg by mouth every 6 (six) hours as needed for moderate pain.    Marland Kitchen amLODipine (NORVASC) 10 MG tablet TAKE 1 TABLET(10 MG) BY MOUTH DAILY 90 tablet 1  . aspirin 81 MG tablet Take 81 mg by mouth  daily.    . Blood Glucose Monitoring Suppl (ACCU-CHEK AVIVA PLUS) w/Device KIT Check BS bid 1 kit 0  . clopidogrel (PLAVIX) 75 MG tablet Take 1 tablet (75 mg total) by mouth daily with breakfast. 90 tablet 1  . gabapentin (NEURONTIN) 300 MG capsule Take 300 mg by mouth as needed.   0  . glipiZIDE (GLUCOTROL XL) 10 MG 24 hr tablet TAKE 1 TABLET(10 MG) BY MOUTH DAILY WITH BREAKFAST 30 tablet 3  . glucose blood (ACCU-CHEK AVIVA PLUS) test strip Check BS BID - new onset dm E11.9 100 each 12  . Lancets (ACCU-CHEK MULTICLIX) lancets Check BS bid - New onset DM E11.9 100 each 12  . metFORMIN (GLUCOPHAGE) 500 MG tablet Take 2 tablets (1,000 mg total) by mouth 2 (two) times daily with a meal. 120 tablet 3  . pantoprazole (PROTONIX) 40 MG tablet TAKE 1 TABLET BY MOUTH EVERY DAY 30 tablet 0   No current facility-administered medications for this visit.    Allergies:   Iohexol    Social History:  The patient  reports that he has been smoking cigarettes. He has a 20.00 pack-year smoking history. He has never used smokeless tobacco. He reports previous alcohol use. He reports that he does not use drugs.   Family History:  The patient's family history includes Heart attack in his father.    ROS:  Please see the history of present illness.   Otherwise, review of systems are positive for none.   All other systems are reviewed and negative.    PHYSICAL EXAM: VS:  BP (!) 144/60 (BP Location: Left Arm, Patient Position: Sitting, Cuff Size: Normal)   Pulse 68   Ht 6' 2"  (1.88 m)   Wt 171 lb 4 oz (77.7 kg)   SpO2 98%   BMI 21.99 kg/m  , BMI Body mass index is 21.99 kg/m. GEN: Well nourished, well developed, in no acute distress  HEENT: normal  Neck: no JVD, carotid bruits, or masses Cardiac: RRR; no murmurs, rubs, or gallops,no edema  Respiratory:  clear to auscultation bilaterally, normal work of breathing GI: soft, nontender, nondistended, + BS MS: no deformity or atrophy  Skin: warm and dry, no  rash Neuro:  Strength and sensation are intact Psych: euthymic mood, full affect Vascular: Femoral pulse: +1 on the right and +2 on the left.  Distal pulses are +2 on the left and not palpable on the right.   EKG:  EKG is ordered today. The ekg ordered today demonstrates normal sinus rhythm with PACs.   Recent Labs: 10/22/2019: ALT 22; BUN 26; Creatinine, Ser 0.65; Hemoglobin 16.4; Platelets 266; Potassium 4.4; Sodium 134    Lipid Panel    Component Value Date/Time   CHOL 107 10/22/2019 1726   CHOL 94 (L) 05/14/2014 0829   TRIG 73 10/22/2019 1726   HDL 30 (L) 10/22/2019 1726   HDL 38 (L) 05/14/2014 0829   CHOLHDL 3.6 10/22/2019 1726   VLDL 15 10/22/2019 1726   LDLCALC 62 10/22/2019 1726   LDLCALC 36 05/14/2014 0829  Wt Readings from Last 3 Encounters:  06/02/20 171 lb 4 oz (77.7 kg)  12/01/19 161 lb 2 oz (73.1 kg)  11/12/19 158 lb 2 oz (71.7 kg)       No flowsheet data found.    ASSESSMENT AND PLAN:  1.  Peripheral arterial disease with claudication: Status post complex revascularization of his iliac arteries in May of this year.  Unfortunately, he has now recurrent right leg claudication with evidence of severe stenosis in the right external iliac artery which threatens the patency of the right iliac stents.  Given his symptoms and duplex findings, I recommend proceeding with angiography and possible endovascular intervention.  I discussed the procedure in details as well as risk and benefits.  Planned access can be via the right common femoral artery or radial artery.  2. Essential hypertension: Blood pressure is reasonably controlled on current medications  3. Hyperlipidemia: He reports stopping atorvastatin because it bothered his stomach.  I discussed with him the importance of taking a statin medication given his extensive peripheral arterial disease.  I elected to start him on rosuvastatin 10 mg daily instead.  4. Tobacco use: I again discussed the  importance of smoking cessation.  5.  Type 2 diabetes: Managed by his primary care physician.   Disposition:   FU with me in 2 months  Signed,  Oscar Sacramento, MD  06/02/2020 3:53 PM    [place]

## 2020-06-02 NOTE — Patient Instructions (Signed)
Medication Instructions:  Your physician has recommended you make the following change in your medication:   STOP Atorvastatin  START Crestor (rosuvastatin) 10 mg daily. An Rx has been sent to your pharmacy.  *If you need a refill on your cardiac medications before your next appointment, please call your pharmacy*   Lab Work: Your physician recommends that you return for lab work (bmp, cbc) on 06/27/20. Please have your lab drawn at the medical mall.  You will need a COVID test the sameday. Please report to the [facility] medical art drive up test site between 8am-1pm.  If you have labs (blood work) drawn today and your tests are completely normal, you will receive your results only by: Marland Kitchen MyChart Message (if you have MyChart) OR . A paper copy in the mail If you have any lab test that is abnormal or we need to change your treatment, we will call you to review the results.   Testing/Procedures: Dr. Fletcher Anon has recommended that you have a Abdominal aortogram.    Follow-Up: At [facility], you and your health needs are our priority.  As part of our continuing mission to provide you with exceptional heart care, we have created designated Provider Care Teams.  These Care Teams include your primary Cardiologist (physician) and Advanced Practice Providers (APPs -  Physician Assistants and Nurse Practitioners) who all work together to provide you with the care you need, when you need it.  We recommend signing up for the patient portal called "MyChart".  Sign up information is provided on this After Visit Summary.  MyChart is used to connect with patients for Virtual Visits (Telemedicine).  Patients are able to view lab/test results, encounter notes, upcoming appointments, etc.  Non-urgent messages can be sent to your provider as well.   To learn more about what you can do with MyChart, go to [email or web address].    Your next appointment:   Mid January 2022   The format for your next  appointment:   In Person  Provider:   You may see [name], MD or one of the following Advanced Practice Providers on your designated Care Team:    Murray Hodgkins, NP  Christell Faith, PA-C  Marrianne Mood, PA-C  Cadence Kathlen Mody, [place]  Laurann Montana, NP    Other Instructions    [address], [place] [address] Dept: [phone] Loc: [place]  06/02/2020  You are scheduled for a Peripheral Angiogram on Wednesday, January 5 with Dr. [name].  1. Please arrive at the [facility] (Main Entrance A) at [facility]: [address] [place], [place] [address] at 6:30 AM (This time is two hours before your procedure to ensure your preparation). Free valet parking service is available.   Special note: Every effort is made to have your procedure done on time. Please understand that emergencies sometimes delay scheduled procedures.  2. Diet: Do not eat solid foods after midnight.  The patient may have clear liquids until 5am upon the day of the procedure.  3. Labs: You will need to have blood drawn on Monday, January 3 at [facility], Go to 1st desk on your right to register.  Address: [address], [place] [address]  Open: 8am - 5pm  Phone: [phone]. You do not need to be fasting.  4. Medication instructions in preparation for your procedure:   Contrast Allergy: No  Do not take Diabetes Med Glucophage (Metformin) on the day of the procedure and HOLD 48 HOURS AFTER THE PROCEDURE.  Do not take Glipizide on the morning of the procedure.  On the morning of your procedure, take your Aspirin and any morning medicines NOT listed above.  You may use sips of water.  5. Plan for one night stay--bring personal belongings. 6. Bring a current list of your medications and current insurance cards. 7.  You MUST have a responsible person to drive you home. 8. Someone MUST be with you the first 24 hours after you arrive home or your discharge will be delayed. 9. Please wear clothes that are easy to get on and off and wear slip-on shoes.  Thank you for allowing [place] to care for you!   -- [facility]

## 2020-06-07 MED ORDER — SODIUM CHLORIDE 0.9% FLUSH: 3.0000 mL | Freq: Two times a day (BID) | INTRAVENOUS | Status: DC

## 2020-06-13 ENCOUNTER — Ambulatory Visit (INDEPENDENT_AMBULATORY_CARE_PROVIDER_SITE_OTHER): Payer: Medicare HMO | Admitting: Family Medicine

## 2020-06-13 ENCOUNTER — Other Ambulatory Visit: Payer: Self-pay

## 2020-06-13 VITALS — BP 140/70 | HR 68 | Temp 97.5°F | Ht 74.0 in | Wt 172.0 lb

## 2020-06-13 DIAGNOSIS — E1151 Type 2 diabetes mellitus with diabetic peripheral angiopathy without gangrene: Secondary | ICD-10-CM | POA: Diagnosis not present

## 2020-06-13 DIAGNOSIS — E785 Hyperlipidemia, unspecified: Secondary | ICD-10-CM

## 2020-06-13 DIAGNOSIS — IMO0002 Reserved for concepts with insufficient information to code with codable children: Secondary | ICD-10-CM

## 2020-06-13 DIAGNOSIS — I739 Peripheral vascular disease, unspecified: Secondary | ICD-10-CM | POA: Diagnosis not present

## 2020-06-13 DIAGNOSIS — E1165 Type 2 diabetes mellitus with hyperglycemia: Secondary | ICD-10-CM

## 2020-06-13 MED ORDER — METFORMIN HCL 500 MG PO TABS
1000.0000 mg | ORAL_TABLET | Freq: Two times a day (BID) | ORAL | 3 refills | Status: DC
Start: 2020-06-13 — End: 2020-09-19

## 2020-06-13 NOTE — Progress Notes (Signed)
Subjective:    Patient ID: Oscar Mills, male    DOB: 09-10-1950, 69 y.o.   MRN: [id number]  HPI  11/06/19 Patient has not been seen by me since 2018.  Recently I received lab work from his cardiologist indicating a hemoglobin A1c of 12 and random blood sugars between 300-500!Marland Kitchen  At that time his cardiologist started him on Metformin.  He is currently taking 500 mg twice daily.  He denies any diarrhea.  He did take Jardiance in the past but he stopped it due to "stomach upset.  He is here today to discuss other treatment options.  At that time, my plan was: I spent 40 minutes today with the patient explaining his diagnosis.  We discussed treatment options.  First I recommended increasing Metformin to 1000 mg twice daily.  Also recommended adding glipizide extended release 10 mg every morning.  Will likely need additional medication however I also recommended that he work on his diet.  Recommended less than 45 g of carbohydrates per meal.  Discussed food choices and how to determine what is a high carbohydrate containing food.  I asked the patient to check his fasting blood sugar and 2-hour postprandial sugars and then recheck with me in office visit in 2 weeks.  Goal fasting blood sugars are between 80 and 130.  Goal 2-hour postprandial sugars are between 80 and 160.  In 2 weeks if his sugars are not falling into that range I will add Jardiance.  Again spent more than 40 minutes today with the patient discussing treatment of his diabetes.  Recheck in 2 weeks.  Also counseled smoking cessation.  06/13/20 Patient is a very pleasant 69 year old gentleman who is here today for follow-up.  His sugar sound extremely good.  Both he and his wife are checking his sugars frequently.  They state that his sugars in the morning are typically 100-130.  The lowest they have seen has been 59.  His 2-hour postprandial sugars later in the day are almost always below 160.  He states that he feels good.  He denies any  episodes of hypoglycemia.  Unfortunately he continues to smoke.  He is not had his flu shot.  He is not had his Covid vaccine.  Past medical history is significant for peripheral artery disease.  They state that his cardiologist is found a blockage in his right leg and they are planning to have an intervention performed.  He does report some neuropathy in his feet.  Foot exam was performed today and he has normal sensation to 10 g monofilament bilaterally.  I am unable to appreciate a dorsalis pedis pulse in the left foot.  Posterior tibialis pulses strong in both feet.  Dorsalis pedis pulses strong in the right foot.  There are no visible ulcers or skin breakdown. Past Medical History:  Diagnosis Date  . Arthritis    NECK  . Diabetes mellitus, type II ([place])   . Dysfunctional alcohol use    now quit  . GERD (gastroesophageal reflux disease)   . Hypertension   . Neuropathy    feet  . Pancreatitis   . Peripheral vascular disease ([place])   . Stress fracture of ankle    Past Surgical History:  Procedure Laterality Date  . ABDOMINAL AORTOGRAM W/LOWER EXTREMITY Bilateral 10/28/2019   Procedure: ABDOMINAL AORTOGRAM W/LOWER EXTREMITY;  Surgeon: Wellington Hampshire, MD;  Location: [facility];  Service: Cardiovascular;  Laterality: Bilateral;  . CATARACT EXTRACTION W/PHACO Left 09/03/2019  Procedure: CATARACT EXTRACTION PHACO AND INTRAOCULAR LENS PLACEMENT (IOC) LEFT VISION BLUE 9.84 00:54.9 17.9%;  Surgeon: Marchia Meiers, MD;  Location: [facility];  Service: Ophthalmology;  Laterality: Left;  Diabetic - diet controlled  . LOWER EXTREMITY ANGIOGRAM Bilateral 02/24/2014   Procedure: LOWER EXTREMITY ANGIOGRAM;  Surgeon: Wellington Hampshire, MD;  Location: [facility];  Service: Cardiovascular;  Laterality: Bilateral;  . PERIPHERAL VASCULAR CATHETERIZATION    . PERIPHERAL VASCULAR INTERVENTION  10/28/2019   Procedure: PERIPHERAL VASCULAR INTERVENTION;  Surgeon: Wellington Hampshire, MD;  Location: [facility];  Service: Cardiovascular;;  . UMBILICAL HERNIA REPAIR N/A 03/05/2017   Procedure: HERNIA REPAIR UMBILICAL ADULT;  Surgeon: Leonie Green, MD;  Location: [facility];  Service: General;  Laterality: N/A;   Current Outpatient Medications on File Prior to Visit  Medication Sig Dispense Refill  . acetaminophen (TYLENOL) 500 MG tablet Take 1,000 mg by mouth every 6 (six) hours as needed for moderate pain.    Marland Kitchen amLODipine (NORVASC) 10 MG tablet TAKE 1 TABLET(10 MG) BY MOUTH DAILY 90 tablet 1  . aspirin 81 MG tablet Take 81 mg by mouth daily.    . Blood Glucose Monitoring Suppl (ACCU-CHEK AVIVA PLUS) w/Device KIT Check BS bid 1 kit 0  . clopidogrel (PLAVIX) 75 MG tablet Take 1 tablet (75 mg total) by mouth daily with breakfast. 90 tablet 1  . gabapentin (NEURONTIN) 300 MG capsule Take 300 mg by mouth as needed.   0  . glipiZIDE (GLUCOTROL XL) 10 MG 24 hr tablet TAKE 1 TABLET(10 MG) BY MOUTH DAILY WITH BREAKFAST 30 tablet 3  . glucose blood (ACCU-CHEK AVIVA PLUS) test strip Check BS BID - new onset dm E11.9 100 each 12  . Lancets (ACCU-CHEK MULTICLIX) lancets Check BS bid - New onset DM E11.9 100 each 12  . pantoprazole (PROTONIX) 40 MG tablet TAKE 1 TABLET BY MOUTH EVERY DAY 30 tablet 0  . rosuvastatin (CRESTOR) 10 MG tablet Take 1 tablet (10 mg total) by mouth daily. 30 tablet 11   Current Facility-Administered Medications on File Prior to Visit  Medication Dose Route Frequency Provider Last Rate Last Admin  . sodium chloride flush (NS) 0.9 % injection 3 mL  3 mL Intravenous Q12H Wellington Hampshire, MD       Allergies  Allergen Reactions  . Iohexol      Code: HIVES, Desc: HIVES WITH OMNIPAQUE 300 OBSERVED BY DR Alvester Chou NO MEDS GIVEN    Social History   Socioeconomic History  . Marital status: Married    Spouse name: Not on file  . Number of children: 2  . Years of education: 27  . Highest education level: Not on file  Occupational History  . Occupation: self-employed   Tobacco Use  . Smoking status: Current Every Day Smoker    Packs/day: 0.50    Years: 40.00    Pack years: 20.00    Types: Cigarettes  . Smokeless tobacco: Never Used  Vaping Use  . Vaping Use: Never used  Substance and Sexual Activity  . Alcohol use: Not Currently    Comment: quit around 2012  . Drug use: No  . Sexual activity: Not on file  Other Topics Concern  . Not on file  Social History Narrative  . Not on file   Social Determinants of Health   Financial Resource Strain: Not on file  Food Insecurity: Not on file  Transportation Needs: Not on file  Physical Activity: Not on file  Stress: Not on file  Social Connections: Not on file  Intimate Partner Violence: Not on file      Review of Systems  All other systems reviewed and are negative.      Objective:   Physical Exam Vitals reviewed.  Constitutional:      Appearance: He is well-developed.  Neck:     Vascular: No JVD.  Cardiovascular:     Rate and Rhythm: Normal rate and regular rhythm.     Heart sounds: Normal heart sounds.  Pulmonary:     Effort: Pulmonary effort is normal.     Breath sounds: Wheezing present.  Abdominal:     General: Bowel sounds are normal. There is no distension.     Palpations: Abdomen is soft.     Tenderness: There is no abdominal tenderness. There is no guarding or rebound.  Musculoskeletal:     Cervical back: Neck supple.           Assessment & Plan:  Uncontrolled type 2 diabetes mellitus with peripheral circulatory disorder (HCC) - Plan: CBC with Differential/Platelet, COMPLETE METABOLIC PANEL WITH GFR, Lipid panel, Microalbumin, urine, Hemoglobin A1c  PAD (peripheral artery disease) (HCC)  Hyperlipidemia, unspecified hyperlipidemia type  I am extremely happy with the blood sugars that he is reporting.  I suspect that his A1c will be less than 7 which is his goal.  Given his peripheral artery disease, I do not feel that Jardiance would be a good option if they  are not at goal.  Instead I would try to push his Metformin to 1000 mg twice a day.  He is only able to tolerate 500 mg twice a day due to stomach upset.  However he has been on the medication now for about 6 months and seems to be doing well with that.  His blood pressure today is acceptable.  I will check a fasting lipid panel.  Goal LDL cholesterol is less than 70.  He is on Crestor.  I will also check a urine microalbumin.  Albumin to creatinine ratio goal is less than 30.  I strongly encouraged the patient to get his Covid vaccine.  Also offered him a flu shot.  Patient is in the precontemplative phase at the present time

## 2020-06-14 LAB — COMPLETE METABOLIC PANEL WITH GFR
AG Ratio: 1.8 (calc) (ref 1.0–2.5)
ALT: 8 U/L — ABNORMAL LOW (ref 9–46)
AST: 9 U/L — ABNORMAL LOW (ref 10–35)
Albumin: 4 g/dL (ref 3.6–5.1)
Alkaline phosphatase (APISO): 69 U/L (ref 35–144)
BUN: 11 mg/dL (ref 7–25)
CO2: 24 mmol/L (ref 20–32)
Calcium: 9.8 mg/dL (ref 8.6–10.3)
Chloride: 107 mmol/L (ref 98–110)
Creat: 0.72 mg/dL (ref 0.70–1.25)
GFR, Est African American: 112 mL/min/{1.73_m2} (ref >=60)
GFR, Est Non African American: 97 mL/min/{1.73_m2} (ref >=60)
Globulin: 2.2 g/dL (calc) (ref 1.9–3.7)
Glucose, Bld: 153 mg/dL — ABNORMAL HIGH (ref 65–99)
Potassium: 4.6 mmol/L (ref 3.5–5.3)
Sodium: 138 mmol/L (ref 135–146)
Total Bilirubin: 0.4 mg/dL (ref 0.2–1.2)
Total Protein: 6.2 g/dL (ref 6.1–8.1)

## 2020-06-14 LAB — HEMOGLOBIN A1C
Hgb A1c MFr Bld: 8 % of total Hgb — ABNORMAL HIGH (ref <5.7)
Mean Plasma Glucose: 183 mg/dL
eAG (mmol/L): 10.1 mmol/L

## 2020-06-14 LAB — CBC WITH DIFFERENTIAL/PLATELET
Absolute Monocytes: 746 cells/uL (ref 200–950)
Basophils Absolute: 45 cells/uL (ref 0–200)
Basophils Relative: 0.4 %
Eosinophils Absolute: 45 cells/uL (ref 15–500)
Eosinophils Relative: 0.4 %
HCT: 45.3 % (ref 38.5–50.0)
Hemoglobin: 15.2 g/dL (ref 13.2–17.1)
Lymphs Abs: 3401 cells/uL (ref 850–3900)
MCH: 30.7 pg (ref 27.0–33.0)
MCHC: 33.6 g/dL (ref 32.0–36.0)
MCV: 91.5 fL (ref 80.0–100.0)
MPV: 10.3 fL (ref 7.5–12.5)
Monocytes Relative: 6.6 %
Neutro Abs: 7063 cells/uL (ref 1500–7800)
Neutrophils Relative %: 62.5 %
Platelets: 298 10*3/uL (ref 140–400)
RBC: 4.95 10*6/uL (ref 4.20–5.80)
RDW: 12.6 % (ref 11.0–15.0)
Total Lymphocyte: 30.1 %
WBC: 11.3 10*3/uL — ABNORMAL HIGH (ref 3.8–10.8)

## 2020-06-14 LAB — LIPID PANEL
Cholesterol: 80 mg/dL (ref <200)
HDL: 31 mg/dL — ABNORMAL LOW (ref >=40)
LDL Cholesterol (Calc): 35 mg/dL (calc)
Non-HDL Cholesterol (Calc): 49 mg/dL (calc) (ref <130)
Total CHOL/HDL Ratio: 2.6 (calc) (ref <5.0)
Triglycerides: 59 mg/dL (ref <150)

## 2020-06-14 LAB — MICROALBUMIN, URINE: Microalb, Ur: 3.4 mg/dL

## 2020-06-26 ENCOUNTER — Other Ambulatory Visit: Payer: Self-pay | Admitting: Cardiovascular Disease

## 2020-06-27 ENCOUNTER — Telehealth: Payer: Self-pay | Admitting: *Deleted

## 2020-06-27 ENCOUNTER — Other Ambulatory Visit
Admission: RE | Admit: 2020-06-27 | Discharge: 2020-06-27 | Disposition: A | Discharge status: Home / Self Care | Payer: Medicare HMO | Source: Ambulatory Visit | Attending: Cardiovascular Disease | Admitting: Cardiovascular Disease

## 2020-06-27 ENCOUNTER — Other Ambulatory Visit: Payer: Self-pay | Admitting: Family

## 2020-06-27 ENCOUNTER — Other Ambulatory Visit: Payer: Self-pay

## 2020-06-27 DIAGNOSIS — I739 Peripheral vascular disease, unspecified: Secondary | ICD-10-CM

## 2020-06-27 DIAGNOSIS — Z20822 Contact with and (suspected) exposure to covid-19: Secondary | ICD-10-CM | POA: Insufficient documentation

## 2020-06-27 DIAGNOSIS — Z01812 Encounter for preprocedural laboratory examination: Secondary | ICD-10-CM | POA: Diagnosis not present

## 2020-06-27 LAB — CBC WITH DIFFERENTIAL/PLATELET
Abs Immature Granulocytes: 0.05 10*3/uL (ref 0.00–0.07)
Basophils Absolute: 0 10*3/uL (ref 0.0–0.1)
Basophils Relative: 0 %
Eosinophils Absolute: 0.1 10*3/uL (ref 0.0–0.5)
Eosinophils Relative: 1 %
HCT: 45.5 % (ref 39.0–52.0)
Hemoglobin: 15.4 g/dL (ref 13.0–17.0)
Immature Granulocytes: 0 %
Lymphocytes Relative: 24 %
Lymphs Abs: 2.9 10*3/uL (ref 0.7–4.0)
MCH: 31.3 pg (ref 26.0–34.0)
MCHC: 33.8 g/dL (ref 30.0–36.0)
MCV: 92.5 fL (ref 80.0–100.0)
Monocytes Absolute: 0.8 10*3/uL (ref 0.1–1.0)
Monocytes Relative: 6 %
Neutro Abs: 8.2 10*3/uL — ABNORMAL HIGH (ref 1.7–7.7)
Neutrophils Relative %: 69 %
Platelets: 274 10*3/uL (ref 150–400)
RBC: 4.92 MIL/uL (ref 4.22–5.81)
RDW: 13.1 % (ref 11.5–15.5)
WBC: 12.1 10*3/uL — ABNORMAL HIGH (ref 4.0–10.5)
nRBC: 0 % (ref 0.0–0.2)

## 2020-06-27 LAB — BASIC METABOLIC PANEL
Anion gap: 6 (ref 5–15)
BUN: 11 mg/dL (ref 8–23)
CO2: 23 mmol/L (ref 22–32)
Calcium: 9.6 mg/dL (ref 8.9–10.3)
Chloride: 107 mmol/L (ref 98–111)
Creatinine, Ser: 0.58 mg/dL — ABNORMAL LOW (ref 0.61–1.24)
GFR, Estimated: >60 mL/min (ref >60)
Glucose, Bld: 218 mg/dL — ABNORMAL HIGH (ref 70–99)
Potassium: 4.2 mmol/L (ref 3.5–5.1)
Sodium: 136 mmol/L (ref 135–145)

## 2020-06-27 LAB — SARS CORONAVIRUS 2 (TAT 6-24 HRS): SARS Coronavirus 2: NEGATIVE

## 2020-06-27 MED ORDER — PREDNISONE 50 MG PO TABS: ORAL_TABLET | ORAL | 0 refills | Status: DC

## 2020-06-27 NOTE — Telephone Encounter (Signed)
Pt contacted pre-abdominal aortogram  scheduled at [facility] for: Wednesday June 29, 2020 8:30 AM Verified arrival time and place: [facility] Main Entrance A [facility]) at: 6:30 AM   No solid food after midnight prior to cath, clear liquids until 5 AM day of procedure.  CONTRAST ALLERGY:  13 hour Prednisone and Benadryl Prep reviewed with patient and patient's wife:  06/29/19 Prednisone 50 mg 7:30 PM 06/30/19 Prednisone 50 mg 1:30 AM 06/30/19 Prednisone 50 mg and benadryl 50 mg just prior to leaving for hospital AM of procedure Pt advised not to drive to hospital.  Hold: Metformin-day of procedure and 48 hours post procedure Glipizide-AM of procedure   Except hold medications AM meds can be  taken pre-cath with sips of water including: ASA 81 mg Plavix 75 mg Prednisone 50 mg Benadryl 50 mg  Confirmed patient has responsible adult to drive home post procedure and be with patient first 24 hours after arriving home: yes  You are allowed ONE visitor in the waiting room during the time you are at the hospital for your procedure. Both you and your visitor must wear a mask once you enter the hospital.       COVID-19 Pre-Screening Questions:  . In the past 14 days have you had any symptoms concerning for COVID-19 infection (fever, chills, cough, or new shortness of breath)? no . In the past 14 days have you been around anyone with known Covid 19? no   Reviewed procedure/mask/visitor instructions, COVID-19 questions with patient's wife (pt verbal permission).

## 2020-06-29 ENCOUNTER — Telehealth: Payer: Self-pay

## 2020-06-29 ENCOUNTER — Other Ambulatory Visit: Payer: Self-pay

## 2020-06-29 ENCOUNTER — Ambulatory Visit (HOSPITAL_COMMUNITY)
Admission: RE | Admit: 2020-06-29 | Discharge: 2020-06-29 | Disposition: A | Discharge status: Home / Self Care | Payer: Medicare HMO | Attending: Cardiovascular Disease | Admitting: Cardiovascular Disease

## 2020-06-29 ENCOUNTER — Encounter (HOSPITAL_COMMUNITY)
Admission: RE | Disposition: A | Discharge status: Home / Self Care | Payer: Self-pay | Source: Home / Self Care | Attending: Cardiovascular Disease

## 2020-06-29 ENCOUNTER — Encounter (HOSPITAL_COMMUNITY): Payer: Self-pay | Admitting: Cardiovascular Disease

## 2020-06-29 DIAGNOSIS — E1151 Type 2 diabetes mellitus with diabetic peripheral angiopathy without gangrene: Secondary | ICD-10-CM | POA: Diagnosis not present

## 2020-06-29 DIAGNOSIS — I10 Essential (primary) hypertension: Secondary | ICD-10-CM | POA: Diagnosis not present

## 2020-06-29 DIAGNOSIS — Z7982 Long term (current) use of aspirin: Secondary | ICD-10-CM | POA: Diagnosis not present

## 2020-06-29 DIAGNOSIS — Z7984 Long term (current) use of oral hypoglycemic drugs: Secondary | ICD-10-CM | POA: Diagnosis not present

## 2020-06-29 DIAGNOSIS — E785 Hyperlipidemia, unspecified: Secondary | ICD-10-CM | POA: Diagnosis not present

## 2020-06-29 DIAGNOSIS — I70211 Atherosclerosis of native arteries of extremities with intermittent claudication, right leg: Secondary | ICD-10-CM | POA: Insufficient documentation

## 2020-06-29 DIAGNOSIS — F17210 Nicotine dependence, cigarettes, uncomplicated: Secondary | ICD-10-CM | POA: Insufficient documentation

## 2020-06-29 DIAGNOSIS — Z79899 Other long term (current) drug therapy: Secondary | ICD-10-CM | POA: Diagnosis not present

## 2020-06-29 DIAGNOSIS — I739 Peripheral vascular disease, unspecified: Secondary | ICD-10-CM

## 2020-06-29 DIAGNOSIS — Z8249 Family history of ischemic heart disease and other diseases of the circulatory system: Secondary | ICD-10-CM | POA: Diagnosis not present

## 2020-06-29 HISTORY — PX: ABDOMINAL AORTOGRAM W/LOWER EXTREMITY: CATH118223

## 2020-06-29 HISTORY — PX: PERIPHERAL VASCULAR INTERVENTION: CATH118257

## 2020-06-29 LAB — POCT ACTIVATED CLOTTING TIME: Activated Clotting Time: 285 seconds

## 2020-06-29 LAB — GLUCOSE, CAPILLARY
Glucose-Capillary: 250 mg/dL — ABNORMAL HIGH (ref 70–99)
Glucose-Capillary: 208 mg/dL — ABNORMAL HIGH (ref 70–99)

## 2020-06-29 SURGERY — ABDOMINAL AORTOGRAM W/LOWER EXTREMITY
Anesthesia: LOCAL | Laterality: Bilateral

## 2020-06-29 MED ORDER — SODIUM CHLORIDE 0.9 % WEIGHT BASED INFUSION
3.0000 mL/kg/h | INTRAVENOUS | Status: AC
  Administered 2020-06-29: 3 mL/kg/h via INTRAVENOUS

## 2020-06-29 MED ORDER — SODIUM CHLORIDE 0.9% FLUSH: 3.0000 mL | INTRAVENOUS | Status: DC | PRN

## 2020-06-29 MED ORDER — HEPARIN SODIUM (PORCINE) 1000 UNIT/ML IJ SOLN
INTRAMUSCULAR | Status: AC
  Filled 2020-06-29: qty 1

## 2020-06-29 MED ORDER — HEPARIN SODIUM (PORCINE) 1000 UNIT/ML IJ SOLN
INTRAMUSCULAR | Status: DC | PRN
  Administered 2020-06-29: 6000 [IU] via INTRAVENOUS

## 2020-06-29 MED ORDER — ACETAMINOPHEN 325 MG PO TABS: 650.0000 mg | ORAL_TABLET | ORAL | Status: DC | PRN

## 2020-06-29 MED ORDER — SODIUM CHLORIDE 0.9% FLUSH: 3.0000 mL | Freq: Two times a day (BID) | INTRAVENOUS | Status: DC

## 2020-06-29 MED ORDER — HEPARIN (PORCINE) IN NACL 2000-0.9 UNIT/L-% IV SOLN
INTRAVENOUS | Status: AC
  Filled 2020-06-29: qty 2000

## 2020-06-29 MED ORDER — MIDAZOLAM HCL 2 MG/2ML IJ SOLN
INTRAMUSCULAR | Status: AC
  Filled 2020-06-29: qty 2

## 2020-06-29 MED ORDER — MIDAZOLAM HCL 2 MG/2ML IJ SOLN
INTRAMUSCULAR | Status: DC | PRN
  Administered 2020-06-29: 1 mg via INTRAVENOUS

## 2020-06-29 MED ORDER — LIDOCAINE HCL (PF) 1 % IJ SOLN
INTRAMUSCULAR | Status: AC
  Filled 2020-06-29: qty 30

## 2020-06-29 MED ORDER — ASPIRIN 81 MG PO CHEW: 81.0000 mg | CHEWABLE_TABLET | ORAL | Status: DC

## 2020-06-29 MED ORDER — SODIUM CHLORIDE 0.9 % IV SOLN: INTRAVENOUS | Status: DC

## 2020-06-29 MED ORDER — SODIUM CHLORIDE 0.9 % WEIGHT BASED INFUSION: 1.0000 mL/kg/h | INTRAVENOUS | Status: DC

## 2020-06-29 MED ORDER — FENTANYL CITRATE (PF) 100 MCG/2ML IJ SOLN
INTRAMUSCULAR | Status: AC
  Filled 2020-06-29: qty 2

## 2020-06-29 MED ORDER — ONDANSETRON HCL 4 MG/2ML IJ SOLN: 4.0000 mg | Freq: Four times a day (QID) | INTRAMUSCULAR | Status: DC | PRN

## 2020-06-29 MED ORDER — IODIXANOL 320 MG/ML IV SOLN
INTRAVENOUS | Status: DC | PRN
  Administered 2020-06-29: 60 mL via INTRA_ARTERIAL

## 2020-06-29 MED ORDER — SODIUM CHLORIDE 0.9 % IV SOLN: 250.0000 mL | INTRAVENOUS | Status: DC | PRN

## 2020-06-29 MED ORDER — FENTANYL CITRATE (PF) 100 MCG/2ML IJ SOLN
INTRAMUSCULAR | Status: DC | PRN
  Administered 2020-06-29: 50 ug via INTRAVENOUS

## 2020-06-29 MED ORDER — LIDOCAINE HCL (PF) 1 % IJ SOLN
INTRAMUSCULAR | Status: DC | PRN
  Administered 2020-06-29: 20 mL

## 2020-06-29 MED ORDER — HEPARIN (PORCINE) IN NACL 1000-0.9 UT/500ML-% IV SOLN
INTRAVENOUS | Status: DC | PRN
  Administered 2020-06-29 (×2): 500 mL

## 2020-06-29 SURGICAL SUPPLY — 19 items
BALLN MUSTANG 6.0X40 75 (BALLOONS) ×3
BALLN MUSTANG 7.0X40 75 (BALLOONS) ×3
BALLOON MUSTANG 6.0X40 75 (BALLOONS) ×2 IMPLANT
BALLOON MUSTANG 7.0X40 75 (BALLOONS) ×2 IMPLANT
CATH ANGIO 5F PIGTAIL 65CM (CATHETERS) ×3 IMPLANT
DEVICE CLOSURE MYNXGRIP 6/7F (Vascular Products) ×3 IMPLANT
KIT ENCORE 26 ADVANTAGE (KITS) ×3 IMPLANT
KIT MICROPUNCTURE NIT STIFF (SHEATH) ×3 IMPLANT
KIT PV (KITS) ×3 IMPLANT
SHEATH BRITE TIP 6FR 35CM (SHEATH) ×3 IMPLANT
SHEATH PINNACLE 5F 10CM (SHEATH) ×3 IMPLANT
SHEATH PINNACLE 6F 10CM (SHEATH) ×3 IMPLANT
SHEATH PROBE COVER 6X72 (BAG) ×3 IMPLANT
STENT INNOVA 8X40X130 (Permanent Stent) ×3 IMPLANT
SYR MEDRAD MARK 7 150ML (SYRINGE) ×3 IMPLANT
TRANSDUCER W/STOPCOCK (MISCELLANEOUS) ×3 IMPLANT
TRAY PV CATH (CUSTOM PROCEDURE TRAY) ×3 IMPLANT
WIRE HITORQ VERSACORE ST 145CM (WIRE) ×3 IMPLANT
WIRE VERSACORE LOC 115CM (WIRE) ×3 IMPLANT

## 2020-06-29 NOTE — Discharge Instructions (Signed)
Femoral Site Care This sheet gives you information about how to care for yourself after your procedure. Your health care provider may also give you more specific instructions. If you have problems or questions, contact your health care provider. What can I expect after the procedure? After the procedure, it is common to have:  Bruising that usually fades within 1-2 weeks.  Tenderness at the site. Follow these instructions at home: Wound care  Follow instructions from your health care provider about how to take care of your insertion site. Make sure you: ? Wash your hands with soap and water before you change your bandage (dressing). If soap and water are not available, use hand sanitizer. ? Change your dressing as told by your health care provider. ? .  Do not take baths, swim, or use a hot tub until your health care provider approves.  You may shower 24-48 hours after the procedure or as told by your health care provider. ? Gently wash the site with plain soap and water. ? Pat the area dry with a clean towel. ? Do not rub the site. This may cause bleeding.  Do not apply powder or lotion to the site. Keep the site clean and dry.  Check your femoral site every day for signs of infection. Check for: ? Redness, swelling, or pain. ? Fluid or blood. ? Warmth. ? Pus or a bad smell. Activity  For the first 2-3 days after your procedure, or as long as directed: ? Avoid climbing stairs as much as possible. ? Do not squat.  Do not lift anything that is heavier than 10 lb (4.5 kg), or the limit that you are told, until your health care provider says that it is safe.  Rest as directed. ? Avoid sitting for a long time without moving. Get up to take short walks every 1-2 hours.  Do not drive for 24 hours if you were given a medicine to help you relax (sedative). General instructions  Take over-the-counter and prescription medicines only as told by your health care provider.  Keep all  follow-up visits as told by your health care provider. This is important. Contact a health care provider if you have:  A fever or chills.  You have redness, swelling, or pain around your insertion site. Get help right away if:  The catheter insertion area swells very fast.  You pass out.  You suddenly start to sweat or your skin gets clammy.  The catheter insertion area is bleeding, and the bleeding does not stop when you hold steady pressure on the area.  The area near or just beyond the catheter insertion site becomes pale, cool, tingly, or numb. These symptoms may represent a serious problem that is an emergency. Do not wait to see if the symptoms will go away. Get medical help right away. Call your local emergency services (911 in the [place] Do not drive yourself to the hospital. Summary  After the procedure, it is common to have bruising that usually fades within 1-2 weeks.  Check your femoral site every day for signs of infection.  Do not lift anything that is heavier than 10 lb (4.5 kg), or the limit that you are told, until your health care provider says that it is safe. This information is not intended to replace advice given to you by your health care provider. Make sure you discuss any questions you have with your health care provider. Document Revised: 06/24/2017 Document Reviewed: 06/24/2017 Elsevier Patient Education  2020   Elsevier Inc.  

## 2020-06-29 NOTE — Telephone Encounter (Signed)
-----   Message from Iran Ouch, MD sent at 06/29/2020 11:01 AM EST ----- Status post stent placement to right external iliac artery.  Schedule an ABI and aortoiliac duplex within the next 2 weeks.

## 2020-06-29 NOTE — Interval H&P Note (Signed)
History and Physical Interval Note:  06/29/2020 8:30 AM  Oscar Mills  has presented today for surgery, with the diagnosis of PAD.  The various methods of treatment have been discussed with the patient and family. After consideration of risks, benefits and other options for treatment, the patient has consented to  Procedure(s): ABDOMINAL AORTOGRAM W/LOWER EXTREMITY (Bilateral) as a surgical intervention.  The patient's history has been reviewed, patient examined, no change in status, stable for surgery.  I have reviewed the patient's chart and labs.  Questions were answered to the patient's satisfaction.     Lorine Bears

## 2020-06-29 NOTE — Telephone Encounter (Signed)
Scheduled.  Called patient to notify.

## 2020-07-06 ENCOUNTER — Ambulatory Visit: Payer: Medicare HMO | Admitting: Family

## 2020-07-21 ENCOUNTER — Ambulatory Visit: Payer: Medicare HMO | Admitting: Family

## 2020-07-25 ENCOUNTER — Other Ambulatory Visit: Payer: Self-pay | Admitting: Family

## 2020-07-25 ENCOUNTER — Other Ambulatory Visit: Payer: Self-pay

## 2020-07-25 ENCOUNTER — Other Ambulatory Visit: Payer: Self-pay | Admitting: Cardiovascular Disease

## 2020-07-25 ENCOUNTER — Ambulatory Visit (INDEPENDENT_AMBULATORY_CARE_PROVIDER_SITE_OTHER): Payer: Medicare HMO

## 2020-07-25 DIAGNOSIS — I739 Peripheral vascular disease, unspecified: Secondary | ICD-10-CM | POA: Diagnosis not present

## 2020-07-25 DIAGNOSIS — Z95820 Peripheral vascular angioplasty status with implants and grafts: Secondary | ICD-10-CM

## 2020-07-25 DIAGNOSIS — Z95828 Presence of other vascular implants and grafts: Secondary | ICD-10-CM

## 2020-07-25 NOTE — Telephone Encounter (Signed)
Rx request sent to pharmacy.  

## 2020-07-28 ENCOUNTER — Ambulatory Visit: Payer: Medicare HMO | Admitting: Cardiovascular Disease

## 2020-08-01 ENCOUNTER — Other Ambulatory Visit: Payer: Self-pay | Admitting: Cardiovascular Disease

## 2020-08-04 DIAGNOSIS — M542 Cervicalgia: Secondary | ICD-10-CM | POA: Diagnosis not present

## 2020-08-04 DIAGNOSIS — G2581 Restless legs syndrome: Secondary | ICD-10-CM | POA: Diagnosis not present

## 2020-08-04 DIAGNOSIS — R201 Hypoesthesia of skin: Secondary | ICD-10-CM | POA: Diagnosis not present

## 2020-08-04 DIAGNOSIS — M545 Low back pain, unspecified: Secondary | ICD-10-CM | POA: Diagnosis not present

## 2020-08-04 DIAGNOSIS — Z79899 Other long term (current) drug therapy: Secondary | ICD-10-CM | POA: Diagnosis not present

## 2020-08-04 DIAGNOSIS — R270 Ataxia, unspecified: Secondary | ICD-10-CM | POA: Diagnosis not present

## 2020-08-25 ENCOUNTER — Ambulatory Visit: Payer: Medicare HMO | Admitting: Cardiovascular Disease

## 2020-08-25 NOTE — Progress Notes (Deleted)
Cardiology Office Note   Date:  08/25/2020   ID:  Ozell, Juhasz 1951-01-31, MRN [id number]  PCP:  Susy Frizzle, MD  Cardiologist:   [name], MD   No chief complaint on file.     History of Present Illness: Oscar Mills is a 70 y.o. male who presents for a followup visit regarding peripheral arterial disease. He is a smoker with hypertension and a family history of heart disease. He has known occlusion of the right SFA and proximal popliteal artery. Noninvasive vascular evaluation in June 2015 indicated an ABI of 0.44 on the right and 1.2 on the left.  He was treated medically with cilostazol with improvement in symptoms.   He was seen in April of this year for rest pain affecting the right lower extremity.  He was suspected of having iliac occlusion given inability to palpate right femoral pulse.  He was also diagnosed around that time with type 2 diabetes. Angiography in May showed flush occlusion of the right common iliac artery with significant disease in the proximal external iliac artery, diffuse subocclusive disease in the SFA with short occlusion in the midsegment and another occlusion of the popliteal artery with three-vessel runoff below the knee.  On the left side there was severe left common iliac artery stenosis.  I performed successful complex revascularization of the right common iliac artery and external iliac artery as well as left common iliac artery with kissing stent placement bilaterally extending into the distal aorta.  He has been doing well with no recent chest pain, shortness of breath or palpitations.  He underwent repeat vascular studies in November which showed an ABI of 0.56 on the right and 0.99 on the left.  Duplex showed severe stenosis in the right mid external iliac artery with peak velocity of 607.  Unfortunately, report recurrent right leg claudication which is happening with minimal exertion at the present time.  No rest pain or  lower extremity ulceration.   Past Medical History:  Diagnosis Date  . Arthritis    NECK  . Diabetes mellitus, type II ([place])   . Dysfunctional alcohol use    now quit  . GERD (gastroesophageal reflux disease)   . Hypertension   . Neuropathy    feet  . Pancreatitis   . Peripheral vascular disease ([place])   . Stress fracture of ankle     Past Surgical History:  Procedure Laterality Date  . ABDOMINAL AORTOGRAM W/LOWER EXTREMITY Bilateral 10/28/2019   Procedure: ABDOMINAL AORTOGRAM W/LOWER EXTREMITY;  Surgeon: Wellington Hampshire, MD;  Location: [facility];  Service: Cardiovascular;  Laterality: Bilateral;  . ABDOMINAL AORTOGRAM W/LOWER EXTREMITY Bilateral 06/29/2020   Procedure: ABDOMINAL AORTOGRAM W/LOWER EXTREMITY;  Surgeon: Wellington Hampshire, MD;  Location: [facility];  Service: Cardiovascular;  Laterality: Bilateral;  . CATARACT EXTRACTION W/PHACO Left 09/03/2019   Procedure: CATARACT EXTRACTION PHACO AND INTRAOCULAR LENS PLACEMENT (IOC) LEFT VISION BLUE 9.84 00:54.9 17.9%;  Surgeon: Marchia Meiers, MD;  Location: [facility];  Service: Ophthalmology;  Laterality: Left;  Diabetic - diet controlled  . LOWER EXTREMITY ANGIOGRAM Bilateral 02/24/2014   Procedure: LOWER EXTREMITY ANGIOGRAM;  Surgeon: Wellington Hampshire, MD;  Location: [facility];  Service: Cardiovascular;  Laterality: Bilateral;  . PERIPHERAL VASCULAR CATHETERIZATION    . PERIPHERAL VASCULAR INTERVENTION  10/28/2019   Procedure: PERIPHERAL VASCULAR INTERVENTION;  Surgeon: Wellington Hampshire, MD;  Location: [facility];  Service: Cardiovascular;;  . PERIPHERAL VASCULAR  INTERVENTION  06/29/2020   Procedure: PERIPHERAL VASCULAR INTERVENTION;  Surgeon: Wellington Hampshire, MD;  Location: [facility];  Service: Cardiovascular;;  . UMBILICAL HERNIA REPAIR N/A 03/05/2017   Procedure: HERNIA REPAIR UMBILICAL ADULT;  Surgeon: Leonie Green, MD;  Location: [facility];  Service: General;  Laterality: N/A;      Current Outpatient Medications  Medication Sig Dispense Refill  . acetaminophen (TYLENOL) 500 MG tablet Take 1,000 mg by mouth every 6 (six) hours as needed for moderate pain.    Marland Kitchen amLODipine (NORVASC) 10 MG tablet TAKE 1 TABLET(10 MG) BY MOUTH DAILY 90 tablet 0  . Amylase-Lipase-Protease (CREON 10 PO)     . aspirin 81 MG tablet Take 81 mg by mouth daily.    . Blood Glucose Monitoring Suppl (ACCU-CHEK AVIVA PLUS) w/Device KIT Check BS bid 1 kit 0  . clopidogrel (PLAVIX) 75 MG tablet Take 1 tablet (75 mg total) by mouth daily with breakfast. 90 tablet 1  . gabapentin (NEURONTIN) 300 MG capsule Take 300 mg by mouth 3 (three) times daily.  0  . glipiZIDE (GLUCOTROL XL) 10 MG 24 hr tablet TAKE 1 TABLET(10 MG) BY MOUTH DAILY WITH BREAKFAST (Patient taking differently: Take 10 mg by mouth daily with breakfast.) 30 tablet 3  . glucose blood (ACCU-CHEK AVIVA PLUS) test strip Check BS BID - new onset dm E11.9 100 each 12  . HYDROcodone-acetaminophen (NORCO) 7.5-325 MG tablet Take 1 tablet by mouth 3 (three) times daily as needed.    . Lancets (ACCU-CHEK MULTICLIX) lancets Check BS bid - New onset DM E11.9 100 each 12  . metFORMIN (GLUCOPHAGE) 500 MG tablet Take 2 tablets (1,000 mg total) by mouth 2 (two) times daily with a meal. (Patient taking differently: Take 500 mg by mouth 2 (two) times daily with a meal.) 120 tablet 3  . pantoprazole (PROTONIX) 40 MG tablet TAKE 1 TABLET BY MOUTH EVERY DAY 90 tablet 1  . predniSONE (DELTASONE) 50 MG tablet 1 tablet 06/28/20 7:30 PM, 1 tablet 06/29/20 1:30 AM, 1 tablet AND benadryl 50 mg just before  leaving for hospital AM of procedure 06/29/20 3 tablet 0  . rosuvastatin (CRESTOR) 10 MG tablet Take 1 tablet (10 mg total) by mouth daily. 30 tablet 11   Current Facility-Administered Medications  Medication Dose Route Frequency Provider Last Rate Last Admin  . sodium chloride flush (NS) 0.9 % injection 3 mL  3 mL Intravenous Q12H Wellington Hampshire, MD         Allergies:   Iohexol    Social History:  The patient  reports that he has been smoking cigarettes. He has a 20.00 pack-year smoking history. He has never used smokeless tobacco. He reports previous alcohol use. He reports that he does not use drugs.   Family History:  The patient's family history includes Heart attack in his father.    ROS:  Please see the history of present illness.   Otherwise, review of systems are positive for none.   All other systems are reviewed and negative.    PHYSICAL EXAM: VS:  There were no vitals taken for this visit. , BMI There is no height or weight on file to calculate BMI. GEN: Well nourished, well developed, in no acute distress  HEENT: normal  Neck: no JVD, carotid bruits, or masses Cardiac: RRR; no murmurs, rubs, or gallops,no edema  Respiratory:  clear to auscultation bilaterally, normal work of breathing GI: soft, nontender, nondistended, + BS MS: no deformity or  atrophy  Skin: warm and dry, no rash Neuro:  Strength and sensation are intact Psych: euthymic mood, full affect Vascular: Femoral pulse: +1 on the right and +2 on the left.  Distal pulses are +2 on the left and not palpable on the right.   EKG:  EKG is ordered today. The ekg ordered today demonstrates normal sinus rhythm with PACs.   Recent Labs: 06/13/2020: ALT 8 06/27/2020: BUN 11; Creatinine, Ser 0.58; Hemoglobin 15.4; Platelets 274; Potassium 4.2; Sodium 136    Lipid Panel    Component Value Date/Time   CHOL 80 06/13/2020 1217   CHOL 94 (L) 05/14/2014 0829   TRIG 59 06/13/2020 1217   HDL 31 (L) 06/13/2020 1217   HDL 38 (L) 05/14/2014 0829   CHOLHDL 2.6 06/13/2020 1217   VLDL 15 10/22/2019 1726   LDLCALC 35 06/13/2020 1217      Wt Readings from Last 3 Encounters:  06/29/20 172 lb (78 kg)  06/13/20 172 lb (78 kg)  06/02/20 171 lb 4 oz (77.7 kg)       No flowsheet data found.    ASSESSMENT AND PLAN:  1.  Peripheral arterial disease with claudication:  Status post complex revascularization of his iliac arteries in May of this year.  Unfortunately, he has now recurrent right leg claudication with evidence of severe stenosis in the right external iliac artery which threatens the patency of the right iliac stents.  Given his symptoms and duplex findings, I recommend proceeding with angiography and possible endovascular intervention.  I discussed the procedure in details as well as risk and benefits.  Planned access can be via the right common femoral artery or radial artery.  2. Essential hypertension: Blood pressure is reasonably controlled on current medications  3. Hyperlipidemia: He reports stopping atorvastatin because it bothered his stomach.  I discussed with him the importance of taking a statin medication given his extensive peripheral arterial disease.  I elected to start him on rosuvastatin 10 mg daily instead.  4. Tobacco use: I again discussed the importance of smoking cessation.  5.  Type 2 diabetes: Managed by his primary care physician.   Disposition:   FU with me in 2 months  Signed,  [name], MD  08/25/2020 8:42 AM    [place]

## 2020-08-28 ENCOUNTER — Other Ambulatory Visit: Payer: Self-pay | Admitting: Family

## 2020-08-28 ENCOUNTER — Other Ambulatory Visit: Payer: Self-pay | Admitting: Family Medicine

## 2020-08-29 ENCOUNTER — Ambulatory Visit: Payer: Medicare HMO | Admitting: Family Medicine

## 2020-08-29 NOTE — Telephone Encounter (Signed)
Scheduled for 3/28.

## 2020-08-29 NOTE — Telephone Encounter (Signed)
Pt overdue for 2 month f/u. Please contact pt for future appointment.

## 2020-09-19 ENCOUNTER — Ambulatory Visit: Payer: Medicare HMO | Admitting: Family

## 2020-09-19 ENCOUNTER — Other Ambulatory Visit: Payer: Self-pay

## 2020-09-19 ENCOUNTER — Encounter: Payer: Self-pay | Admitting: Family

## 2020-09-19 VITALS — BP 128/68 | HR 62 | Ht 74.0 in | Wt 167.0 lb

## 2020-09-19 DIAGNOSIS — Z720 Tobacco use: Secondary | ICD-10-CM | POA: Diagnosis not present

## 2020-09-19 DIAGNOSIS — I739 Peripheral vascular disease, unspecified: Secondary | ICD-10-CM

## 2020-09-19 DIAGNOSIS — E1165 Type 2 diabetes mellitus with hyperglycemia: Secondary | ICD-10-CM

## 2020-09-19 DIAGNOSIS — E785 Hyperlipidemia, unspecified: Secondary | ICD-10-CM | POA: Diagnosis not present

## 2020-09-19 DIAGNOSIS — I491 Atrial premature depolarization: Secondary | ICD-10-CM | POA: Diagnosis not present

## 2020-09-19 DIAGNOSIS — I10 Essential (primary) hypertension: Secondary | ICD-10-CM | POA: Diagnosis not present

## 2020-09-19 NOTE — Patient Instructions (Addendum)
Medication Instructions:  Continue your current medications.   The pharmacy has been called and told to stop filling your atorvastatin.   *If you need a refill on your cardiac medications before your next appointment, please call your pharmacy*  Lab Work: None ordered today. Your cholesterol panel in December showed your LDL (bad cholesterol) was at goal of <70.   Testing/Procedures:  Your physician has requested that you have an aorta/ iliac duplex- July 2022. During this test, an ultrasound is used to evaluate the aorta. Allow 30 minutes for this exam. Do not eat after midnight the day before and avoid carbonated beverages   Your physician has requested that you have an ankle brachial index (ABI) approximately July 2022 for monitoring. During this test an ultrasound and blood pressure cuff are used to evaluate the arteries that supply the arms and legs with blood. Allow thirty minutes for this exam. There are no restrictions or special instructions.  Follow-Up: At [facility], you and your health needs are our priority.  As part of our continuing mission to provide you with exceptional heart care, we have created designated Provider Care Teams.  These Care Teams include your primary Cardiologist (physician) and Advanced Practice Providers (APPs -  Physician Assistants and Nurse Practitioners) who all work together to provide you with the care you need, when you need it.  We recommend signing up for the patient portal called "MyChart".  Sign up information is provided on this After Visit Summary.  MyChart is used to connect with patients for Virtual Visits (Telemedicine).  Patients are able to view lab/test results, encounter notes, upcoming appointments, etc.  Non-urgent messages can be sent to your provider as well.   To learn more about what you can do with MyChart, go to [email or web address].    Your next appointment:   4 month(s)  The format for your next appointment:   In  Person  Provider:   [name], MD (only- PVD follow up)   Other Instructions  Heart Healthy Diet Recommendations: A low-salt diet is recommended. Meats should be grilled, baked, or boiled. Avoid fried foods. Focus on lean protein sources like fish or chicken with vegetables and fruits. The American Heart Association is a Microbiologist!  American Heart Association Diet and Lifeystyle Recommendations   Exercise recommendations: The American Heart Association recommends 150 minutes of moderate intensity exercise weekly. Try 30 minutes of moderate intensity exercise 4-5 times per week. This could include walking, jogging, or swimming.  Managing the Challenge of Quitting Smoking Quitting smoking is a physical and mental challenge. You will face cravings, withdrawal symptoms, and temptation. Before quitting, work with your health care provider to make a plan that can help you manage quitting. Preparation can help you quit and keep you from giving in. How to manage lifestyle changes Managing stress Stress can make you want to smoke, and wanting to smoke may cause stress. It is important to find ways to manage your stress. You might try some of the following:  Practice relaxation techniques. ? Breathe slowly and deeply, in through your nose and out through your mouth. ? Listen to music. ? Soak in a bath or take a shower. ? Imagine a peaceful place or vacation.  Get some support. ? Talk with family or friends about your stress. ? Join a support group. ? Talk with a counselor or therapist.  Get some physical activity. ? Go for a walk, run, or bike ride. ? Play a favorite sport. ? Practice  yoga.   Medicines Talk with your health care provider about medicines that might help you deal with cravings and make quitting easier for you. Relationships Social situations can be difficult when you are quitting smoking. To manage this, you can:  Avoid parties and other social situations  where people might be smoking.  Avoid alcohol.  Leave right away if you have the urge to smoke.  Explain to your family and friends that you are quitting smoking. Ask for support and let them know you might be a bit grumpy.  Plan activities where smoking is not an option. General instructions Be aware that many people gain weight after they quit smoking. However, not everyone does. To keep from gaining weight, have a plan in place before you quit and stick to the plan after you quit. Your plan should include:  Having healthy snacks. When you have a craving, it may help to: ? Eat popcorn, carrots, celery, or other cut vegetables. ? Chew sugar-free gum.  Changing how you eat. ? Eat small portion sizes at meals. ? Eat 4-6 small meals throughout the day instead of 1-2 large meals a day. ? Be mindful when you eat. Do not watch television or do other things that might distract you as you eat.  Exercising regularly. ? Make time to exercise each day. If you do not have time for a long workout, do short bouts of exercise for 5-10 minutes several times a day. ? Do some form of strengthening exercise, such as weight lifting. ? Do some exercise that gets your heart beating and causes you to breathe deeply, such as walking fast, running, swimming, or biking. This is very important.  Drinking plenty of water or other low-calorie or no-calorie drinks. Drink 6-8 glasses of water daily.   How to recognize withdrawal symptoms Your body and mind may experience discomfort as you try to get used to not having nicotine in your system. These effects are called withdrawal symptoms. They may include:  Feeling hungrier than normal.  Having trouble concentrating.  Feeling irritable or restless.  Having trouble sleeping.  Feeling depressed.  Craving a cigarette. To manage withdrawal symptoms:  Avoid places, people, and activities that trigger your cravings.  Remember why you want to quit.  Get  plenty of sleep.  Avoid coffee and other caffeinated drinks. These may worsen some of your symptoms. These symptoms may surprise you. But be assured that they are normal to have when quitting smoking. How to manage cravings Come up with a plan for how to deal with your cravings. The plan should include the following:  A definition of the specific situation you want to deal with.  An alternative action you will take.  A clear idea for how this action will help.  The name of someone who might help you with this. Cravings usually last for 5-10 minutes. Consider taking the following actions to help you with your plan to deal with cravings:  Keep your mouth busy. ? Chew sugar-free gum. ? Suck on hard candies or a straw. ? Brush your teeth.  Keep your hands and body busy. ? Change to a different activity right away. ? Squeeze or play with a ball. ? Do an activity or a hobby, such as making bead jewelry, practicing needlepoint, or working with wood. ? Mix up your normal routine. ? Take a short exercise break. Go for a quick walk or run up and down stairs.  Focus on doing something kind or helpful for  someone else.  Call a friend or family member to talk during a craving.  Join a support group.  Contact a quitline. Where to find support To get help or find a support group:  Call the [facility]'s Smoking Quitline: 1-800-QUIT NOW 718-276-5772)  Visit the website of the Substance Abuse and [place]: ktimeonline.com  Text QUIT to SmokefreeTXT: 962836 Where to find more information Visit these websites to find more information on quitting smoking:  [place]: www.smokefree.gov  American Lung Association: www.lung.org  American Cancer Society: www.cancer.org  Centers for Disease Control and Prevention: http://www.wolf.info/  American Heart Association: www.heart.org Contact a health care provider if:  You want to change your  plan for quitting.  The medicines you are taking are not helping.  Your eating feels out of control or you cannot sleep. Get help right away if:  You feel depressed or become very anxious. Summary  Quitting smoking is a physical and mental challenge. You will face cravings, withdrawal symptoms, and temptation to smoke again. Preparation can help you as you go through these challenges.  Try different techniques to manage stress, handle social situations, and prevent weight gain.  You can deal with cravings by keeping your mouth busy (such as by chewing gum), keeping your hands and body busy, calling family or friends, or contacting a quitline for people who want to quit smoking.  You can deal with withdrawal symptoms by avoiding places where people smoke, getting plenty of rest, and avoiding drinks with caffeine. This information is not intended to replace advice given to you by your health care provider. Make sure you discuss any questions you have with your health care provider. Document Revised: 03/31/2019 Document Reviewed: 03/31/2019 Elsevier Patient Education  [place].

## 2020-09-19 NOTE — Progress Notes (Signed)
Office Visit    Patient Name: Oscar Mills Date of Encounter: 09/19/2020  PCP:  Susy Frizzle, MD   [place]  Cardiologist:  [name], MD  Advanced Practice Provider:  No care team member to display Electrophysiologist:  None    Chief Complaint    Oscar Mills is a 70 y.o. male with a hx of PAD, tobacco use, HTN  presents today for follow-up of PAD  Past Medical History    Past Medical History:  Diagnosis Date  . Arthritis    NECK  . Diabetes mellitus, type II ([place])   . Dysfunctional alcohol use    now quit  . GERD (gastroesophageal reflux disease)   . Hypertension   . Neuropathy    feet  . Pancreatitis   . Peripheral vascular disease ([place])   . Stress fracture of ankle    Past Surgical History:  Procedure Laterality Date  . ABDOMINAL AORTOGRAM W/LOWER EXTREMITY Bilateral 10/28/2019   Procedure: ABDOMINAL AORTOGRAM W/LOWER EXTREMITY;  Surgeon: Wellington Hampshire, MD;  Location: [facility];  Service: Cardiovascular;  Laterality: Bilateral;  . ABDOMINAL AORTOGRAM W/LOWER EXTREMITY Bilateral 06/29/2020   Procedure: ABDOMINAL AORTOGRAM W/LOWER EXTREMITY;  Surgeon: Wellington Hampshire, MD;  Location: [facility];  Service: Cardiovascular;  Laterality: Bilateral;  . CATARACT EXTRACTION W/PHACO Left 09/03/2019   Procedure: CATARACT EXTRACTION PHACO AND INTRAOCULAR LENS PLACEMENT (IOC) LEFT VISION BLUE 9.84 00:54.9 17.9%;  Surgeon: Marchia Meiers, MD;  Location: [facility];  Service: Ophthalmology;  Laterality: Left;  Diabetic - diet controlled  . LOWER EXTREMITY ANGIOGRAM Bilateral 02/24/2014   Procedure: LOWER EXTREMITY ANGIOGRAM;  Surgeon: Wellington Hampshire, MD;  Location: [facility];  Service: Cardiovascular;  Laterality: Bilateral;  . PERIPHERAL VASCULAR CATHETERIZATION    . PERIPHERAL VASCULAR INTERVENTION  10/28/2019   Procedure: PERIPHERAL VASCULAR INTERVENTION;  Surgeon: Wellington Hampshire, MD;  Location: [facility];  Service: Cardiovascular;;  . PERIPHERAL VASCULAR INTERVENTION  06/29/2020   Procedure: PERIPHERAL VASCULAR INTERVENTION;  Surgeon: Wellington Hampshire, MD;  Location: [facility];  Service: Cardiovascular;;  . UMBILICAL HERNIA REPAIR N/A 03/05/2017   Procedure: HERNIA REPAIR UMBILICAL ADULT;  Surgeon: Leonie Green, MD;  Location: [facility];  Service: General;  Laterality: N/A;    Allergies  Allergies  Allergen Reactions  . Iohexol      Code: HIVES, Desc: HIVES WITH OMNIPAQUE 300 OBSERVED BY DR Alvester Chou NO MEDS GIVEN     History of Present Illness    Oscar Mills is a 70 y.o. male with a hx of PAD, tobacco use, HTN last seen for lower extremity angiography with Dr. Fletcher Anon 06/29/2020.  He follows closely with Dr. Fletcher Anon regarding peripheral arterial disease.  He has known occlusion of the right SFA and proximal popliteal artery.  Noninvasive vascular evaluation in June 2015 with ABI 0.44 on the right and 1.2 on the left.  Treated medically with cilostazol with improvement in symptoms.  Seen April 2021 for rest pain affecting RLE.  Angiography in May with flush occlusion of the right common iliac artery with significant disease in proximal external iliac artery, diffuse subocclusive disease in the SFA with short occlusion in the mid segment and another occlusion of the popliteal artery and three-vessel runoff below the knee.  On the left side there is severe left common iliac artery stenosis.  He underwent complex revascularization of the right common iliac artery and external iliac  artery as well as left common iliac artery with kissing stent placement bilaterally extending into distal aorta.  Vascular studies November 2021 with ABI of 0.56 on the right and 0.9 on the left.  Duplex with severe stenosis of the right mid external iliac artery with peak velocity of 607.  He underwent angiography 06/29/2020 with patent bilateral common iliac artery stents with minimal  restenosis, severe subtotal occlusion in the right mid external iliac artery with occluded internal iliac artery with successful angioplasty and self-expanding stent placement to the right external iliac artery.  Follow-up duplex studies 07/25/2020 showed patent iliac stents recommended for repeat imaging in 6 months.    EKGs/Labs/Other Studies Reviewed:   The following studies were reviewed today: US aorta/IVC/iliac 07/25/2018 Summary:  Stenosis:  +--------------------+-------------+-----------+---------------------------  ----+  Location           Stenosis     Stent      Comments                          +--------------------+-------------+-----------+---------------------------  ----+  Right Common Iliac               no stenosis                                  +--------------------+-------------+-----------+---------------------------  ----+  Left Common Iliac   >50% stenosisno stenosiselevated velocities in the                                                    distal common iliac artery        +--------------------+-------------+-----------+---------------------------  ----+  Right External Iliac             no stenosis                                  +--------------------+-------------+-----------+---------------------------  ----+  Left External Iliac <50% stenosis                                             +--------------------+-------------+-----------+---------------------------  ----+   Atherosclerosis in the aorta and iliac arteres.    Compared to prior ultrasound in 04/2020, there is improvement.   [place] ABI 07/25/2020 Summary:  Right: Resting right ankle-brachial index indicates moderate right lower  extremity arterial disease. The right toe-brachial index is abnormal.   Left: Resting left ankle-brachial index is within normal range. No  evidence of significant left lower extremity arterial disease. The left  toe-brachial index  is normal.   Peripheral vascular catheterization 06/29/20 1.  Patent bilateral common iliac artery stents with minimal restenosis.  There is severe subtotal occlusion in the right mid external iliac artery with occluded internal iliac artery. 2.  Successful angioplasty and self-expanding stent placement to the right external iliac artery.   Recommendations: Continue dual antiplatelet therapy. Aggressive treatment of risk factors and smoking cessation.  EKG:  EKG is  ordered today.  The ekg ordered today demonstrates SB 57 bpm with no acute ST/T wave changes.   Recent Labs: 06/13/2020: ALT 8 06/27/2020: BUN 11;  Creatinine, Ser 0.58; Hemoglobin 15.4; Platelets 274; Potassium 4.2; Sodium 136  Recent Lipid Panel    Component Value Date/Time   CHOL 80 06/13/2020 1217   CHOL 94 (L) 05/14/2014 0829   TRIG 59 06/13/2020 1217   HDL 31 (L) 06/13/2020 1217   HDL 38 (L) 05/14/2014 0829   CHOLHDL 2.6 06/13/2020 1217   VLDL 15 10/22/2019 1726   LDLCALC 35 06/13/2020 1217    Home Medications   Current Meds  Medication Sig  . acetaminophen (TYLENOL) 500 MG tablet Take 1,000 mg by mouth every 6 (six) hours as needed for moderate pain.  Marland Kitchen amLODipine (NORVASC) 10 MG tablet TAKE 1 TABLET(10 MG) BY MOUTH DAILY  . aspirin 81 MG tablet Take 81 mg by mouth daily.  . Blood Glucose Monitoring Suppl (ACCU-CHEK AVIVA PLUS) w/Device KIT Check BS bid  . clopidogrel (PLAVIX) 75 MG tablet TAKE 1 TABLET(75 MG) BY MOUTH DAILY WITH BREAKFAST  . gabapentin (NEURONTIN) 300 MG capsule Take 300 mg by mouth 3 (three) times daily.  Marland Kitchen glipiZIDE (GLUCOTROL XL) 10 MG 24 hr tablet TAKE 1 TABLET(10 MG) BY MOUTH DAILY WITH BREAKFAST  . glucose blood (ACCU-CHEK AVIVA PLUS) test strip Check BS BID - new onset dm E11.9  . HYDROcodone-acetaminophen (NORCO) 7.5-325 MG tablet Take 1 tablet by mouth 3 (three) times daily as needed.  . Lancets (ACCU-CHEK MULTICLIX) lancets Check BS bid - New onset DM E11.9  . metFORMIN (GLUCOPHAGE)  500 MG tablet Take 500 mg by mouth 2 (two) times daily with a meal.  . pantoprazole (PROTONIX) 40 MG tablet TAKE 1 TABLET BY MOUTH EVERY DAY  . rosuvastatin (CRESTOR) 10 MG tablet Take 1 tablet (10 mg total) by mouth daily.   Current Facility-Administered Medications for the 09/19/20 encounter (Office Visit) with [name], NP  Medication  . sodium chloride flush (NS) 0.9 % injection 3 mL     Review of Systems  All other systems reviewed and are otherwise negative except as noted above.  Physical Exam    VS:  BP (!) 148/70 (BP Location: Left Arm, Patient Position: Sitting, Cuff Size: Normal)   Pulse 62   Ht 6' 2"  (1.88 m)   Wt 167 lb (75.8 kg)   SpO2 95%   BMI 21.44 kg/m  , BMI Body mass index is 21.44 kg/m.  Wt Readings from Last 3 Encounters:  09/19/20 167 lb (75.8 kg)  06/29/20 172 lb (78 kg)  06/13/20 172 lb (78 kg)    GEN: Well nourished, well developed, in no acute distress. HEENT: normal. Neck: Supple, no JVD, carotid bruits, or masses. Cardiac: irregular, no murmurs, rubs, or gallops. No clubbing, cyanosis, edema.  Radials 2+ and equal bilaterally. LLE PT 2+. RLE PT 1+. Respiratory:  Respirations regular and unlabored, clear to auscultation bilaterally. GI: Soft, nontender, nondistended. MS: No deformity or atrophy. Skin: Warm and dry, no rash. Neuro:  Strength and sensation are intact. Psych: Normal affect.  Assessment & Plan    1. PAD - No new claudication symptoms.  Endorses doing a walking regimen.  Continue aspirin, Plavix, Crestor.  Plan for repeat aortoiliac duplex as well as ABIs in July 2022 for monitoring.  Smoking cessation encouraged.  2. PAC - Noted on previous EKG. NO palpitations. HR irregular on exam. EKG with sinus bradycardia. Likely etiology PAC's.  Recommend he stay well-hydrated, quit smoking, avoid caffeine.  3. HTN - BP well controlled. Continue current antihypertensive regimen.   4. HLD - Previous intolerance of atorvastain.  Tolerates rosuvastatin 10 mg daily without difficulty.  Most recent LDL less than 70.  5. Tobacco use - Smoking cessation encouraged. Recommend utilization of 1800QUITNOW.  6. DM2 - Continue to follow with PCP.  He reports some numbness and tingling to his right foot that is unchanged with activity.  Discussed likely etiology of neuropathy.  He is presently prescribed gabapentin 300 mg 3 times takes only twice per day.  Encouraged to trial dose of CPAP for symptoms of follow-up with his primary care provider.  Disposition: Follow up 3-4 with Dr. Fletcher Anon or APP   Signed, [name], NP 09/19/2020, 9:00 AM [place]

## 2020-10-02 ENCOUNTER — Other Ambulatory Visit: Payer: Self-pay | Admitting: Family Medicine

## 2020-10-11 ENCOUNTER — Other Ambulatory Visit: Payer: Self-pay | Admitting: Family Medicine

## 2020-11-03 ENCOUNTER — Other Ambulatory Visit: Payer: Self-pay | Admitting: Cardiovascular Disease

## 2020-11-10 DIAGNOSIS — R200 Anesthesia of skin: Secondary | ICD-10-CM | POA: Diagnosis not present

## 2020-11-10 DIAGNOSIS — M542 Cervicalgia: Secondary | ICD-10-CM | POA: Diagnosis not present

## 2020-11-10 DIAGNOSIS — M545 Low back pain, unspecified: Secondary | ICD-10-CM | POA: Diagnosis not present

## 2020-11-10 DIAGNOSIS — M79671 Pain in right foot: Secondary | ICD-10-CM | POA: Diagnosis not present

## 2020-11-10 DIAGNOSIS — G5603 Carpal tunnel syndrome, bilateral upper limbs: Secondary | ICD-10-CM | POA: Diagnosis not present

## 2020-11-22 ENCOUNTER — Ambulatory Visit: Payer: Medicare HMO

## 2020-11-22 ENCOUNTER — Ambulatory Visit (INDEPENDENT_AMBULATORY_CARE_PROVIDER_SITE_OTHER): Payer: Medicare HMO

## 2020-11-22 ENCOUNTER — Other Ambulatory Visit: Payer: Self-pay

## 2020-11-22 ENCOUNTER — Ambulatory Visit: Payer: Medicare HMO | Admitting: Podiatry

## 2020-11-22 DIAGNOSIS — S9031XA Contusion of right foot, initial encounter: Secondary | ICD-10-CM

## 2020-11-22 MED ORDER — DOXYCYCLINE HYCLATE 100 MG PO TABS
100.0000 mg | ORAL_TABLET | Freq: Two times a day (BID) | ORAL | 0 refills | Status: DC
Start: 2020-11-22 — End: 2021-03-14

## 2020-11-22 NOTE — Progress Notes (Signed)
   HPI: 69 y.o. male presenting today for new complaint regarding an injury that the patient sustained on 11/20/2020.  Patient states that he dropped a piece of steel on his right foot while working.  He was wearing leather shoes at the time.  He states that his first toe as well as the adjacent toes are very swollen and bruised.  He did lacerate the top of his foot in between the first and second toes.  His wife has been soaking his foot in Epsom salt and applying antibiotic cream.  They present for further treatment and evaluation  Past Medical History:  Diagnosis Date  . Arthritis    NECK  . Diabetes mellitus, type II ([place])   . Dysfunctional alcohol use    now quit  . GERD (gastroesophageal reflux disease)   . Hypertension   . Neuropathy    feet  . Pancreatitis   . Peripheral vascular disease ([place])   . Stress fracture of ankle      Physical Exam: General: The patient is alert and oriented x3 in no acute distress.  Dermatology: Superficial laceration noted between the first and second digits of the injured foot right.  This appears to be more like a deep abrasion extending through the dermal skin into some of the superficial subcutaneous tissue.  No exposed bone muscle tendon ligament or joint.  Minimal drainage noted.  No purulence noted.  No erythema to the area.  No malodor.  Vascular: Palpable pedal pulses bilaterally.  Capillary refill immediate.  Ecchymosis with some edema noted throughout the entire forefoot  Neurological: Epicritic and protective threshold grossly intact bilaterally.   Musculoskeletal Exam: No pedal deformities.  Associated tenderness to palpation forefoot  Radiographic Exam:  Normal osseous mineralization. Joint spaces preserved. No fracture/dislocation/boney destruction.    Assessment: 1.  No fracture identified 2.  Blunt force trauma right dorsal forefoot with deep abrasion/laceration   Plan of Care:  1. Patient evaluated. X-Rays reviewed.  2.   Silvadene cream provided for the patient to apply daily to the laceration area 3.  Prescription for doxycycline 100 mg 2 times daily #20 4.  Postsurgical shoe dispensed.  Weightbearing as tolerated 5.  Return to clinic in 4 weeks      Edrick Kins, DPM [facility]  Dr. Edrick Kins, DPM    [address], [place] [address]                Office [phone]  Fax [phone]

## 2020-11-26 ENCOUNTER — Other Ambulatory Visit: Payer: Self-pay | Admitting: Family

## 2020-12-15 ENCOUNTER — Other Ambulatory Visit: Payer: Self-pay | Admitting: Cardiovascular Disease

## 2020-12-15 DIAGNOSIS — I739 Peripheral vascular disease, unspecified: Secondary | ICD-10-CM

## 2020-12-23 ENCOUNTER — Ambulatory Visit: Payer: Medicare HMO | Admitting: Podiatry

## 2020-12-27 ENCOUNTER — Ambulatory Visit (INDEPENDENT_AMBULATORY_CARE_PROVIDER_SITE_OTHER): Payer: Medicare HMO

## 2020-12-27 ENCOUNTER — Other Ambulatory Visit: Payer: Self-pay

## 2020-12-27 DIAGNOSIS — Z95828 Presence of other vascular implants and grafts: Secondary | ICD-10-CM | POA: Diagnosis not present

## 2020-12-27 DIAGNOSIS — I739 Peripheral vascular disease, unspecified: Secondary | ICD-10-CM

## 2020-12-29 ENCOUNTER — Telehealth: Payer: Self-pay | Admitting: *Deleted

## 2020-12-29 NOTE — Chronic Care Management (AMB) (Signed)
  Chronic Care Management   Note  12/29/2020 Name: Oscar Mills MRN: [id number] DOB: May 22, 1951  ADRIANN BALLWEG is a 70 y.o. year old male who is a primary care patient of Susy Frizzle, MD. I reached out to Gennaro Africa by phone today in response to a referral sent by Mr. Sherrill Mckamie [facility] PCP, Dr. Dennard Schaumann.      Mr. Tesler was given information about Chronic Care Management services today including:  CCM service includes personalized support from designated clinical staff supervised by his physician, including individualized plan of care and coordination with other care providers 24/7 contact phone numbers for assistance for urgent and routine care needs. Service will only be billed when office clinical staff spend 20 minutes or more in a month to coordinate care. Only one practitioner may furnish and bill the service in a calendar month. The patient may stop CCM services at any time (effective at the end of the month) by phone call to the office staff. The patient will be responsible for cost sharing (co-pay) of up to 20% of the service fee (after annual deductible is met).  Patient did not agree to enrollment in care management services and does not wish to consider at this time.  Follow up plan: Patient declines engagement by the care management team. Appropriate care team members and provider have been notified via electronic communication. The care management team is available to follow up with the patient after provider conversation with the patient regarding recommendation for care management engagement and subsequent re-referral to the care management team.   [place] [facility]

## 2021-01-03 ENCOUNTER — Ambulatory Visit: Payer: Medicare HMO | Admitting: Cardiovascular Disease

## 2021-01-03 ENCOUNTER — Other Ambulatory Visit: Payer: Self-pay

## 2021-01-03 DIAGNOSIS — I739 Peripheral vascular disease, unspecified: Secondary | ICD-10-CM

## 2021-01-23 ENCOUNTER — Ambulatory Visit: Payer: Medicare HMO | Admitting: Family Medicine

## 2021-01-23 ENCOUNTER — Other Ambulatory Visit: Payer: Self-pay

## 2021-01-23 ENCOUNTER — Other Ambulatory Visit: Payer: Self-pay | Admitting: Cardiovascular Disease

## 2021-02-04 ENCOUNTER — Other Ambulatory Visit: Payer: Self-pay | Admitting: Cardiovascular Disease

## 2021-02-16 ENCOUNTER — Ambulatory Visit: Payer: Medicare HMO | Admitting: Cardiovascular Disease

## 2021-02-16 DIAGNOSIS — M542 Cervicalgia: Secondary | ICD-10-CM | POA: Diagnosis not present

## 2021-02-16 DIAGNOSIS — M545 Low back pain, unspecified: Secondary | ICD-10-CM | POA: Diagnosis not present

## 2021-02-16 DIAGNOSIS — G5603 Carpal tunnel syndrome, bilateral upper limbs: Secondary | ICD-10-CM | POA: Diagnosis not present

## 2021-02-16 DIAGNOSIS — G2581 Restless legs syndrome: Secondary | ICD-10-CM | POA: Diagnosis not present

## 2021-02-16 DIAGNOSIS — G603 Idiopathic progressive neuropathy: Secondary | ICD-10-CM | POA: Diagnosis not present

## 2021-02-18 ENCOUNTER — Other Ambulatory Visit: Payer: Self-pay | Admitting: Family Medicine

## 2021-02-28 ENCOUNTER — Other Ambulatory Visit: Payer: Self-pay | Admitting: Family

## 2021-03-11 ENCOUNTER — Other Ambulatory Visit: Payer: Self-pay | Admitting: Cardiovascular Disease

## 2021-03-13 NOTE — Progress Notes (Signed)
Cardiology Office Note    Date:  03/14/2021   ID:  Oscar Mills, Oscar Mills 1951/02/05, MRN [id number]  PCP:  Donita Brooks, MD  Cardiologist:  Lorine Bears, MD  Electrophysiologist:  None   Chief Complaint: Follow-up  History of Present Illness:   Oscar Mills is a 70 y.o. male with history of PAD status post prior iliac stenting, tobacco use, HTN, HLD, and DM2 who presents for follow-up of PAD.  He is followed by Dr. Kirke Corin for PAD.  He has known occlusion of the right SFA and proximal popliteal artery, dating back to 11/2013.  Initially, he was managed medically with cilostazol with improvement in symptoms.  He was seen in 09/2019 with rest pain affecting the right lower extremity.  Angiography in 10/2019 demonstrated a flush occlusion of the right common iliac artery with significant disease in the proximal external iliac artery, diffuse subocclusive disease in the SFA with a short occlusion in the midsegment, and another occlusion in the popliteal artery along with three-vessel runoff below the knee.  On the left side there was severe common iliac artery stenosis.  He underwent complex revascularization of the right common iliac artery and external iliac artery as well as left common iliac artery with kissing stent placement bilaterally extending into the distal aorta.  Repeat vascular studies in 04/2020 showed an ABI of 0.56 on the right and 0.9 on the left.  Duplex demonstrated severe stenosis of the right mid external iliac artery with a peak velocity of 607.  In this setting he underwent repeat angiography in 06/2020 which demonstrated patent bilateral common iliac artery stents with minimal restenosis, severe subtotal occlusion of the right mid external iliac artery with occluded internal iliac artery.  He underwent successful angioplasty and self-expanding stenting to the right external iliac artery.  Follow-up duplex studies in late 06/2020 showed patent iliac stents.  Most recent  noninvasive lower extremity imaging in 12/2020 demonstrated stable ABIs bilaterally with right-sided ABI 0.57 (prior 0.56), and left-sided ABI 1.03 (prior 1.02) and patent iliac stents as outlined below.  He comes in doing well today.  No angina, dyspnea, palpitations, dizziness, presyncope, syncope, lower extremity swelling, abdominal distention, orthopnea, PND, or early satiety.  No significant claudication or ambulatory limitation.  He is tolerating cardiac medications without issues.  He does continue to smoke, though has tapered down to less than 1/2 pack/day.  He is uninterested in assistance with further smoking cessation at this time.  He is not checking his blood pressures at home.  He does check his feet regularly for wounds and has not noted any.  He does not have any issues or concerns at this time.   Labs independently reviewed: 06/2020 - potassium 4.2, BUN 11, serum creatinine 0.58, Hgb 15.4, PLT 274 05/2020 - A1c 8.0, TC 80, TG 59, HDL 31, LDL 35, albumin 4.0, AST/ALT not elevated 09/2012 - TSH normal  Past Medical History:  Diagnosis Date   Arthritis    NECK   Diabetes mellitus, type II (HCC)    Dysfunctional alcohol use    now quit   GERD (gastroesophageal reflux disease)    Hypertension    Neuropathy    feet   Pancreatitis    Peripheral vascular disease (HCC)    Stress fracture of ankle     Past Surgical History:  Procedure Laterality Date   ABDOMINAL AORTOGRAM W/LOWER EXTREMITY Bilateral 10/28/2019   Procedure: ABDOMINAL AORTOGRAM W/LOWER EXTREMITY;  Surgeon: Iran Ouch, MD;  Location:  [facility];  Service: Cardiovascular;  Laterality: Bilateral;   ABDOMINAL AORTOGRAM W/LOWER EXTREMITY Bilateral 06/29/2020   Procedure: ABDOMINAL AORTOGRAM W/LOWER EXTREMITY;  Surgeon: Wellington Hampshire, MD;  Location: [facility];  Service: Cardiovascular;  Laterality: Bilateral;   CATARACT EXTRACTION W/PHACO Left 09/03/2019   Procedure: CATARACT EXTRACTION PHACO AND  INTRAOCULAR LENS PLACEMENT (IOC) LEFT VISION BLUE 9.84 00:54.9 17.9%;  Surgeon: Marchia Meiers, MD;  Location: [facility];  Service: Ophthalmology;  Laterality: Left;  Diabetic - diet controlled   LOWER EXTREMITY ANGIOGRAM Bilateral 02/24/2014   Procedure: LOWER EXTREMITY ANGIOGRAM;  Surgeon: Wellington Hampshire, MD;  Location: [facility];  Service: Cardiovascular;  Laterality: Bilateral;   PERIPHERAL VASCULAR CATHETERIZATION     PERIPHERAL VASCULAR INTERVENTION  10/28/2019   Procedure: PERIPHERAL VASCULAR INTERVENTION;  Surgeon: Wellington Hampshire, MD;  Location: [facility];  Service: Cardiovascular;;   PERIPHERAL VASCULAR INTERVENTION  06/29/2020   Procedure: PERIPHERAL VASCULAR INTERVENTION;  Surgeon: Wellington Hampshire, MD;  Location: [facility];  Service: Cardiovascular;;   UMBILICAL HERNIA REPAIR N/A 03/05/2017   Procedure: HERNIA REPAIR UMBILICAL ADULT;  Surgeon: Leonie Green, MD;  Location: [facility];  Service: General;  Laterality: N/A;    Current Medications: Current Meds  Medication Sig   acetaminophen (TYLENOL) 500 MG tablet Take 1,000 mg by mouth every 6 (six) hours as needed for moderate pain.   amLODipine (NORVASC) 10 MG tablet Take 1 tablet (10 mg total) by mouth daily.   aspirin 81 MG tablet Take 81 mg by mouth daily.   Blood Glucose Monitoring Suppl (ACCU-CHEK AVIVA PLUS) w/Device KIT Check BS bid   clopidogrel (PLAVIX) 75 MG tablet TAKE 1 TABLET(75 MG) BY MOUTH DAILY WITH BREAKFAST   gabapentin (NEURONTIN) 300 MG capsule Take 300 mg by mouth 3 (three) times daily.   glipiZIDE (GLUCOTROL XL) 10 MG 24 hr tablet TAKE 1 TABLET(10 MG) BY MOUTH DAILY WITH BREAKFAST   glucose blood (ACCU-CHEK AVIVA PLUS) test strip Check BS BID - new onset dm E11.9   HYDROcodone-acetaminophen (NORCO) 7.5-325 MG tablet Take 1 tablet by mouth 3 (three) times daily as needed.   Lancets (ACCU-CHEK MULTICLIX) lancets Check BS bid - New onset DM E11.9   metFORMIN (GLUCOPHAGE) 500 MG  tablet Take 2 tablets in AM and 1 tablet in PM with meals   pantoprazole (PROTONIX) 40 MG tablet TAKE 1 TABLET BY MOUTH EVERY DAY    Allergies:   Iohexol   Social History   Socioeconomic History   Marital status: Married    Spouse name: Not on file   Number of children: 2   Years of education: 12   Highest education level: Not on file  Occupational History   Occupation: self-employed  Tobacco Use   Smoking status: Every Day    Packs/day: 0.50    Years: 40.00    Pack years: 20.00    Types: Cigarettes   Smokeless tobacco: Never  Vaping Use   Vaping Use: Never used  Substance and Sexual Activity   Alcohol use: Not Currently    Comment: quit around 2012   Drug use: No   Sexual activity: Not on file  Other Topics Concern   Not on file  Social History Narrative   Not on file   Social Determinants of Health   Financial Resource Strain: Not on file  Food Insecurity: Not on file  Transportation Needs: Not on file  Physical Activity: Not on file  Stress:  Not on file  Social Connections: Not on file     Family History:  The patient's family history includes Heart attack in his father.  ROS:   Review of Systems  Constitutional:  Negative for chills, diaphoresis, fever, malaise/fatigue and weight loss.  HENT:  Negative for congestion.   Eyes:  Negative for discharge and redness.  Respiratory:  Negative for cough, sputum production, shortness of breath and wheezing.   Cardiovascular:  Negative for chest pain, palpitations, orthopnea, claudication, leg swelling and PND.  Gastrointestinal:  Negative for abdominal pain, blood in stool, heartburn, melena, nausea and vomiting.  Musculoskeletal:  Negative for falls and myalgias.  Skin:  Negative for rash.  Neurological:  Negative for dizziness, tingling, tremors, sensory change, speech change, focal weakness, loss of consciousness and weakness.  Endo/Heme/Allergies:  Does not bruise/bleed easily.  Psychiatric/Behavioral:   Negative for substance abuse. The patient is not nervous/anxious.     EKGs/Labs/Other Studies Reviewed:    Studies reviewed were summarized above. The additional studies were reviewed today:  PVD ultrasound 12/2020: Abdominal Aorta: No evidence of an abdominal aortic aneurysm was  visualized. The largest aortic measurement is 1.7 cm.  Stenosis: +-------------------+-------------+  Location           Stenosis       +-------------------+-------------+  Left External Iliac>50% stenosis  +-------------------+-------------+   All stents appear to be widely patent      Suggest follow up study in 12 months.  __________  ABIs 12/2020: Right: Resting right ankle-brachial index indicates moderate right lower  extremity arterial disease, 0.57 (prior 0.56). The right toe-brachial index is abnormal.   Left: Resting left ankle-brachial index is within normal range.  No evidence of significant left lower extremity arterial disease.  The left toe-brachial index is normal, 1.03 (prior 1.02).    EKG:  EKG is ordered today.  The EKG ordered today demonstrates NSR, 63 bpm, normal axis, no acute ST-T changes  Recent Labs: 06/13/2020: ALT 8 06/27/2020: BUN 11; Creatinine, Ser 0.58; Hemoglobin 15.4; Platelets 274; Potassium 4.2; Sodium 136  Recent Lipid Panel    Component Value Date/Time   CHOL 80 06/13/2020 1217   CHOL 94 (L) 05/14/2014 0829   TRIG 59 06/13/2020 1217   HDL 31 (L) 06/13/2020 1217   HDL 38 (L) 05/14/2014 0829   CHOLHDL 2.6 06/13/2020 1217   VLDL 15 10/22/2019 1726   LDLCALC 35 06/13/2020 1217    PHYSICAL EXAM:    VS:  BP 136/62   Pulse 63   Ht $R'6\' 2"'MH$  (1.88 m)   Wt 162 lb (73.5 kg)   SpO2 96%   BMI 20.80 kg/m   BMI: Body mass index is 20.8 kg/m.  Physical Exam Vitals reviewed.  Constitutional:      Appearance: He is well-developed.  HENT:     Head: Normocephalic and atraumatic.  Eyes:     General:        Right eye: No discharge.        Left eye: No  discharge.  Neck:     Vascular: No JVD.  Cardiovascular:     Rate and Rhythm: Normal rate and regular rhythm.     Pulses:          Dorsalis pedis pulses are 1+ on the right side and 2+ on the left side.       Posterior tibial pulses are 1+ on the right side and 2+ on the left side.     Heart sounds: Normal heart sounds,  S1 normal and S2 normal. Heart sounds not distant. No midsystolic click and no opening snap. No murmur heard.   No friction rub.     Comments: No wounds along the bilateral lower extremities Pulmonary:     Effort: Pulmonary effort is normal. No respiratory distress.     Breath sounds: Normal breath sounds. No decreased breath sounds, wheezing or rales.  Chest:     Chest wall: No tenderness.  Abdominal:     General: There is no distension.     Palpations: Abdomen is soft.     Tenderness: There is no abdominal tenderness.  Musculoskeletal:     Cervical back: Normal range of motion.     Right lower leg: No edema.     Left lower leg: No edema.  Skin:    General: Skin is warm and dry.     Nails: There is no clubbing.  Neurological:     Mental Status: He is alert and oriented to person, place, and time.  Psychiatric:        Speech: Speech normal.        Behavior: Behavior normal.        Thought Content: Thought content normal.        Judgment: Judgment normal.    Wt Readings from Last 3 Encounters:  03/14/21 162 lb (73.5 kg)  09/19/20 167 lb (75.8 kg)  06/29/20 172 lb (78 kg)     ASSESSMENT & PLAN:   PAD with claudication: Symptoms are overall stable without critical limb ischemia or lifestyle limitation.  He remains on aspirin, clopidogrel, and rosuvastatin.  Risk factor modification including complete smoking cessation is recommended.  HTN: Blood pressure was mildly elevated at triage with recheck BP improved to 136/62.  He will continue to monitor blood pressure.  He remains on amlodipine.  HLD: LDL 35 in 05/2020.  He remains on rosuvastatin.  Tobacco  use: He has tapered his tobacco use to less than half pack per day.  He declines our assistance at this time.  He will contact us if he would like our assistance and smoking cessation  DM2: A1c 8.0 in 05/2020.  Follow-up with PCP as directed.  Disposition: F/u with Dr. Fletcher Anon or an APP in 12 months, sooner if needed.   Medication Adjustments/Labs and Tests Ordered: Current medicines are reviewed at length with the patient today.  Concerns regarding medicines are outlined above. Medication changes, Labs and Tests ordered today are summarized above and listed in the Patient Instructions accessible in Encounters.   Signed, Christell Faith, PA-C 03/14/2021 9:44 AM     [facility] [address] [place] [address] [place], [place] [address] [phone]

## 2021-03-14 ENCOUNTER — Other Ambulatory Visit: Payer: Self-pay

## 2021-03-14 ENCOUNTER — Encounter: Payer: Self-pay | Admitting: Physician Assistant

## 2021-03-14 ENCOUNTER — Ambulatory Visit: Payer: Medicare HMO | Admitting: Physician Assistant

## 2021-03-14 VITALS — BP 136/62 | HR 63 | Ht 74.0 in | Wt 162.0 lb

## 2021-03-14 DIAGNOSIS — Z720 Tobacco use: Secondary | ICD-10-CM | POA: Diagnosis not present

## 2021-03-14 DIAGNOSIS — Z95828 Presence of other vascular implants and grafts: Secondary | ICD-10-CM

## 2021-03-14 DIAGNOSIS — E1165 Type 2 diabetes mellitus with hyperglycemia: Secondary | ICD-10-CM

## 2021-03-14 DIAGNOSIS — E785 Hyperlipidemia, unspecified: Secondary | ICD-10-CM

## 2021-03-14 DIAGNOSIS — I739 Peripheral vascular disease, unspecified: Secondary | ICD-10-CM

## 2021-03-14 DIAGNOSIS — I10 Essential (primary) hypertension: Secondary | ICD-10-CM

## 2021-03-14 NOTE — Patient Instructions (Signed)
Medication Instructions:  No changes at this time.  *If you need a refill on your cardiac medications before your next appointment, please call your pharmacy*   Lab Work: None If you have labs (blood work) drawn today and your tests are completely normal, you will receive your results only by: [place] (if you have MyChart) OR A paper copy in the mail If you have any lab test that is abnormal or we need to change your treatment, we will call you to review the results.   Testing/Procedures: None   Follow-Up: At [facility], you and your health needs are our priority.  As part of our continuing mission to provide you with exceptional heart care, we have created designated Provider Care Teams.  These Care Teams include your primary Cardiologist (physician) and Advanced Practice Providers (APPs -  Physician Assistants and Nurse Practitioners) who all work together to provide you with the care you need, when you need it.  We recommend signing up for the patient portal called "MyChart".  Sign up information is provided on this After Visit Summary.  MyChart is used to connect with patients for Virtual Visits (Telemedicine).  Patients are able to view lab/test results, encounter notes, upcoming appointments, etc.  Non-urgent messages can be sent to your provider as well.   To learn more about what you can do with MyChart, go to [email or web address].    Your next appointment:   1 year(s)  The format for your next appointment:   In Person  Provider:   You may see [name], MD or one of the following Advanced Practice Providers on your designated Care Team:   Murray Hodgkins, NP Christell Faith, PA-C Marrianne Mood, PA-C Cadence Wade, [place]

## 2021-04-09 ENCOUNTER — Other Ambulatory Visit: Payer: Self-pay | Admitting: Cardiovascular Disease

## 2021-04-27 ENCOUNTER — Other Ambulatory Visit: Payer: Self-pay | Admitting: Cardiovascular Disease

## 2021-05-11 DIAGNOSIS — M79671 Pain in right foot: Secondary | ICD-10-CM | POA: Diagnosis not present

## 2021-05-11 DIAGNOSIS — G603 Idiopathic progressive neuropathy: Secondary | ICD-10-CM | POA: Diagnosis not present

## 2021-05-11 DIAGNOSIS — G2581 Restless legs syndrome: Secondary | ICD-10-CM | POA: Diagnosis not present

## 2021-05-11 DIAGNOSIS — M545 Low back pain, unspecified: Secondary | ICD-10-CM | POA: Diagnosis not present

## 2021-05-11 DIAGNOSIS — Z760 Encounter for issue of repeat prescription: Secondary | ICD-10-CM | POA: Diagnosis not present

## 2021-05-30 ENCOUNTER — Other Ambulatory Visit: Payer: Self-pay | Admitting: Cardiovascular Disease

## 2021-07-01 ENCOUNTER — Other Ambulatory Visit: Payer: Self-pay | Admitting: Family Medicine

## 2021-07-03 ENCOUNTER — Other Ambulatory Visit: Payer: Self-pay | Admitting: Family Medicine

## 2021-07-27 ENCOUNTER — Other Ambulatory Visit: Payer: Self-pay | Admitting: Cardiovascular Disease

## 2021-08-17 DIAGNOSIS — M5442 Lumbago with sciatica, left side: Secondary | ICD-10-CM | POA: Diagnosis not present

## 2021-08-17 DIAGNOSIS — M5441 Lumbago with sciatica, right side: Secondary | ICD-10-CM | POA: Diagnosis not present

## 2021-08-17 DIAGNOSIS — Z79899 Other long term (current) drug therapy: Secondary | ICD-10-CM | POA: Diagnosis not present

## 2021-08-17 DIAGNOSIS — G2581 Restless legs syndrome: Secondary | ICD-10-CM | POA: Diagnosis not present

## 2021-08-21 ENCOUNTER — Other Ambulatory Visit: Payer: Self-pay

## 2021-10-11 ENCOUNTER — Other Ambulatory Visit: Payer: Self-pay | Admitting: Family Medicine

## 2021-10-12 ENCOUNTER — Other Ambulatory Visit: Payer: Self-pay | Admitting: Family Medicine

## 2021-11-23 DIAGNOSIS — Z79899 Other long term (current) drug therapy: Secondary | ICD-10-CM | POA: Diagnosis not present

## 2021-11-23 DIAGNOSIS — G603 Idiopathic progressive neuropathy: Secondary | ICD-10-CM | POA: Diagnosis not present

## 2021-11-23 DIAGNOSIS — M5417 Radiculopathy, lumbosacral region: Secondary | ICD-10-CM | POA: Diagnosis not present

## 2021-11-23 DIAGNOSIS — G2581 Restless legs syndrome: Secondary | ICD-10-CM | POA: Diagnosis not present

## 2021-11-23 DIAGNOSIS — M5412 Radiculopathy, cervical region: Secondary | ICD-10-CM | POA: Diagnosis not present

## 2021-12-03 ENCOUNTER — Other Ambulatory Visit: Payer: Self-pay | Admitting: Cardiovascular Disease

## 2021-12-04 NOTE — Telephone Encounter (Signed)
Rx request sent to pharmacy.  

## 2021-12-25 ENCOUNTER — Telehealth: Payer: Self-pay | Admitting: Cardiovascular Disease

## 2021-12-25 DIAGNOSIS — I739 Peripheral vascular disease, unspecified: Secondary | ICD-10-CM

## 2021-12-25 NOTE — Telephone Encounter (Signed)
Updated orders placed for PV testing.

## 2021-12-25 NOTE — Telephone Encounter (Signed)
Patient's wife called to reschedule vascular test, but the orders expired prior to the next available. Patient needs orders updated in order to schedule.

## 2021-12-29 NOTE — Telephone Encounter (Signed)
LVM TO SCHEDULE

## 2022-01-03 ENCOUNTER — Telehealth: Payer: Self-pay | Admitting: Family Medicine

## 2022-01-03 NOTE — Telephone Encounter (Signed)
Patients wife left vm asking for a referral to Dr. Buford Dresser GI doctor.

## 2022-01-04 NOTE — Telephone Encounter (Signed)
Spoke with pt and made an OV appt to get referral for GI specialist on 01/18/22 at 11:15

## 2022-01-18 ENCOUNTER — Ambulatory Visit (INDEPENDENT_AMBULATORY_CARE_PROVIDER_SITE_OTHER): Payer: Medicare HMO | Admitting: Family Medicine

## 2022-01-18 VITALS — BP 118/58 | HR 83 | Temp 98.4°F | Ht 74.0 in | Wt 133.0 lb

## 2022-01-18 DIAGNOSIS — I739 Peripheral vascular disease, unspecified: Secondary | ICD-10-CM

## 2022-01-18 DIAGNOSIS — F17200 Nicotine dependence, unspecified, uncomplicated: Secondary | ICD-10-CM | POA: Diagnosis not present

## 2022-01-18 DIAGNOSIS — R634 Abnormal weight loss: Secondary | ICD-10-CM | POA: Diagnosis not present

## 2022-01-18 DIAGNOSIS — E118 Type 2 diabetes mellitus with unspecified complications: Secondary | ICD-10-CM

## 2022-01-18 DIAGNOSIS — R1032 Left lower quadrant pain: Secondary | ICD-10-CM

## 2022-01-18 DIAGNOSIS — E785 Hyperlipidemia, unspecified: Secondary | ICD-10-CM

## 2022-01-18 MED ORDER — AMOXICILLIN-POT CLAVULANATE 875-125 MG PO TABS: 1.0000 | ORAL_TABLET | Freq: Two times a day (BID) | ORAL | 0 refills | Status: DC

## 2022-01-18 NOTE — Progress Notes (Signed)
Subjective:    Patient ID: Oscar Mills, male    DOB: 06/06/1951, 71 y.o.   MRN: [id number]  HPI  11/06/19 Patient has not been seen by me since 2018.  Recently I received lab work from his cardiologist indicating a hemoglobin A1c of 12 and random blood sugars between 300-500!Marland Kitchen  At that time his cardiologist started him on Metformin.  He is currently taking 500 mg twice daily.  He denies any diarrhea.  He did take Jardiance in the past but he stopped it due to "stomach upset.  He is here today to discuss other treatment options.  At that time, my plan was: I spent 40 minutes today with the patient explaining his diagnosis.  We discussed treatment options.  First I recommended increasing Metformin to 1000 mg twice daily.  Also recommended adding glipizide extended release 10 mg every morning.  Will likely need additional medication however I also recommended that he work on his diet.  Recommended less than 45 g of carbohydrates per meal.  Discussed food choices and how to determine what is a high carbohydrate containing food.  I asked the patient to check his fasting blood sugar and 2-hour postprandial sugars and then recheck with me in office visit in 2 weeks.  Goal fasting blood sugars are between 80 and 130.  Goal 2-hour postprandial sugars are between 80 and 160.  In 2 weeks if his sugars are not falling into that range I will add Jardiance.  Again spent more than 40 minutes today with the patient discussing treatment of his diabetes.  Recheck in 2 weeks.  Also counseled smoking cessation.  06/13/20 Patient is a very pleasant 71 year old gentleman who is here today for follow-up.  His sugar sound extremely good.  Both he and his wife are checking his sugars frequently.  They state that his sugars in the morning are typically 100-130.  The lowest they have seen has been 27.  His 2-hour postprandial sugars later in the day are almost always below 160.  He states that he feels good.  He denies any  episodes of hypoglycemia.  Unfortunately he continues to smoke.  He is not had his flu shot.  He is not had his Covid vaccine.  Past medical history is significant for peripheral artery disease.  They state that his cardiologist is found a blockage in his right leg and they are planning to have an intervention performed.  He does report some neuropathy in his feet.  Foot exam was performed today and he has normal sensation to 10 g monofilament bilaterally.  I am unable to appreciate a dorsalis pedis pulse in the left foot.  Posterior tibialis pulses strong in both feet.  Dorsalis pedis pulses strong in the right foot.  There are no visible ulcers or skin breakdown.  At that time, my plan was:  I am extremely happy with the blood sugars that he is reporting.  I suspect that his A1c will be less than 7 which is his goal.  Given his peripheral artery disease, I do not feel that Jardiance would be a good option if they are not at goal.  Instead I would try to push his Metformin to 1000 mg twice a day.  He is only able to tolerate 500 mg twice a day due to stomach upset.  However he has been on the medication now for about 6 months and seems to be doing well with that.  His blood pressure today is acceptable.  I will check a fasting lipid panel.  Goal LDL cholesterol is less than 70.  He is on Crestor.  I will also check a urine microalbumin.  Albumin to creatinine ratio goal is less than 30.  I strongly encouraged the patient to get his Covid vaccine.  Also offered him a flu shot.  Patient is in the precontemplative phase at the present time  01/18/22 I have not seen the patient since 2021.  Last HgA1c was 8 in 2021.  Patient has lost considerable weight since I last saw him.  He reports polyuria.  He reports polydipsia.  However 3 weeks ago he developed left lower quadrant abdominal pain.  He describes it as a "stomachache".  It sounds like he is having cramp-like abdominal pain in the left lower side.  He is also  having diarrhea.  He denies any melena or hematochezia.  He does report weight loss.  Food does not make the pain worse.  He denies fever or chills.  He has never had a colonoscopy.  He does have a remote history of pancreatitis more than 10 years ago.  His wife raises the concern about pancreatic insufficiency.  She states that he has been having a lot of bloating, greasy stool, and diarrhea.  However 3 weeks ago he developed the pain in his left side.  He quit metformin however the symptoms have not improved.  Today on examination he has hyperactive bowel sounds.  He is tender to palpation on the right side due to a fractured rib.  However there is no mass or tenderness in the right upper quadrant.  There is no tenderness over the epigastric area or his pancreas.  He does have some tenderness in his left lower side. Past Medical History:  Diagnosis Date   Arthritis    NECK   Diabetes mellitus, type II ([place])    Dysfunctional alcohol use    now quit   GERD (gastroesophageal reflux disease)    Hypertension    Neuropathy    feet   Pancreatitis    Peripheral vascular disease (HCC)    Stress fracture of ankle    Past Surgical History:  Procedure Laterality Date   ABDOMINAL AORTOGRAM W/LOWER EXTREMITY Bilateral 10/28/2019   Procedure: ABDOMINAL AORTOGRAM W/LOWER EXTREMITY;  Surgeon: Wellington Hampshire, MD;  Location: [facility];  Service: Cardiovascular;  Laterality: Bilateral;   ABDOMINAL AORTOGRAM W/LOWER EXTREMITY Bilateral 06/29/2020   Procedure: ABDOMINAL AORTOGRAM W/LOWER EXTREMITY;  Surgeon: Wellington Hampshire, MD;  Location: [facility];  Service: Cardiovascular;  Laterality: Bilateral;   CATARACT EXTRACTION W/PHACO Left 09/03/2019   Procedure: CATARACT EXTRACTION PHACO AND INTRAOCULAR LENS PLACEMENT (IOC) LEFT VISION BLUE 9.84 00:54.9 17.9%;  Surgeon: Marchia Meiers, MD;  Location: [facility];  Service: Ophthalmology;  Laterality: Left;  Diabetic - diet controlled   LOWER  EXTREMITY ANGIOGRAM Bilateral 02/24/2014   Procedure: LOWER EXTREMITY ANGIOGRAM;  Surgeon: Wellington Hampshire, MD;  Location: [facility];  Service: Cardiovascular;  Laterality: Bilateral;   PERIPHERAL VASCULAR CATHETERIZATION     PERIPHERAL VASCULAR INTERVENTION  10/28/2019   Procedure: PERIPHERAL VASCULAR INTERVENTION;  Surgeon: Wellington Hampshire, MD;  Location: [facility];  Service: Cardiovascular;;   PERIPHERAL VASCULAR INTERVENTION  06/29/2020   Procedure: PERIPHERAL VASCULAR INTERVENTION;  Surgeon: Wellington Hampshire, MD;  Location: [facility];  Service: Cardiovascular;;   UMBILICAL HERNIA REPAIR N/A 03/05/2017   Procedure: HERNIA REPAIR UMBILICAL ADULT;  Surgeon: Leonie Green,  MD;  Location: [facility];  Service: General;  Laterality: N/A;   Current Outpatient Medications on File Prior to Visit  Medication Sig Dispense Refill   acetaminophen (TYLENOL) 500 MG tablet Take 1,000 mg by mouth every 6 (six) hours as needed for moderate pain.     amLODipine (NORVASC) 10 MG tablet TAKE 1 TABLET(10 MG) BY MOUTH DAILY 90 tablet 2   aspirin 81 MG tablet Take 81 mg by mouth daily.     Blood Glucose Monitoring Suppl (ACCU-CHEK AVIVA PLUS) w/Device KIT Check BS bid 1 kit 0   clopidogrel (PLAVIX) 75 MG tablet TAKE 1 TABLET(75 MG) BY MOUTH DAILY WITH BREAKFAST 90 tablet 0   gabapentin (NEURONTIN) 300 MG capsule Take 300 mg by mouth 3 (three) times daily.  0   glipiZIDE (GLUCOTROL XL) 10 MG 24 hr tablet TAKE 1 TABLET(10 MG) BY MOUTH DAILY WITH BREAKFAST 90 tablet 0   glucose blood (ACCU-CHEK AVIVA PLUS) test strip Check BS BID - new onset dm E11.9 100 each 12   HYDROcodone-acetaminophen (NORCO) 7.5-325 MG tablet Take 1 tablet by mouth 3 (three) times daily as needed.     Lancets (ACCU-CHEK MULTICLIX) lancets Check BS bid - New onset DM E11.9 100 each 12   metFORMIN (GLUCOPHAGE) 500 MG tablet TAKE 2 TABLETS(1000 MG) BY MOUTH TWICE DAILY WITH A MEAL 120 tablet 0   pantoprazole (PROTONIX) 40 MG  tablet TAKE 1 TABLET BY MOUTH EVERY DAY 90 tablet 2   rosuvastatin (CRESTOR) 10 MG tablet TAKE 1 TABLET(10 MG) BY MOUTH DAILY 90 tablet 0   tiZANidine (ZANAFLEX) 4 MG tablet  (Patient not taking: Reported on 01/18/2022)     No current facility-administered medications on file prior to visit.   Allergies  Allergen Reactions   Iohexol      Code: HIVES, Desc: HIVES WITH OMNIPAQUE 300 OBSERVED BY DR Alvester Chou NO MEDS GIVEN    Social History   Socioeconomic History   Marital status: Married    Spouse name: Not on file   Number of children: 2   Years of education: 12   Highest education level: Not on file  Occupational History   Occupation: self-employed  Tobacco Use   Smoking status: Every Day    Packs/day: 0.50    Years: 40.00    Total pack years: 20.00    Types: Cigarettes   Smokeless tobacco: Never  Vaping Use   Vaping Use: Never used  Substance and Sexual Activity   Alcohol use: Not Currently    Comment: quit around 2012   Drug use: No   Sexual activity: Not on file  Other Topics Concern   Not on file  Social History Narrative   Not on file   Social Determinants of Health   Financial Resource Strain: Not on file  Food Insecurity: Not on file  Transportation Needs: Not on file  Physical Activity: Not on file  Stress: Not on file  Social Connections: Not on file  Intimate Partner Violence: Not on file      Review of Systems  All other systems reviewed and are negative.      Objective:   Physical Exam Vitals reviewed.  Constitutional:      Appearance: He is well-developed.  Neck:     Vascular: No JVD.  Cardiovascular:     Rate and Rhythm: Normal rate and regular rhythm.     Heart sounds: Normal heart sounds.  Pulmonary:     Effort: Pulmonary effort is normal.  Breath sounds: Wheezing present.  Abdominal:     General: Abdomen is flat. Bowel sounds are increased. There is no distension.     Palpations: Abdomen is soft.     Tenderness: There is  abdominal tenderness in the left lower quadrant. There is no guarding or rebound.    Musculoskeletal:     Cervical back: Neck supple.           Assessment & Plan:  Type 2 diabetes mellitus with complication, without long-term current use of insulin (HCC) - Plan: CBC with Differential/Platelet, Lipid panel, COMPLETE METABOLIC PANEL WITH GFR, Microalbumin, urine, Hemoglobin A1c  PAD (peripheral artery disease) (HCC)  Hyperlipidemia, unspecified hyperlipidemia type  Smoker  Left lower quadrant abdominal pain - Plan: Lipase  Weight loss Given the location, I am concerned about colitis.  Differential diagnosis includes infectious colitis similar to diverticulitis versus ischemic colitis given his history of peripheral artery disease versus inflammatory bowel disease.  Malignancy is also on the differential diagnosis given his age and his significant weight loss.  For these reasons I will schedule the patient for a CT scan to help differentiate.  Meanwhile, we will start Augmentin 875 mg twice daily to cover for possible infectious colitis until we have the results of the CT scan.  Obtain CBC CMP A1c and lipid panel to determine management of his diabetes.  I suspect that his diabetes is out of control which could be contributing to his weight loss.  Also on the differential diagnosis would be pancreatic insufficiency however I do not believe that that would explain the pain that he has been having.  Therefore I put that further down on the differential.  Await the results of the CT scan.  If CT scan is inconclusive, colonoscopy would be the next step.

## 2022-01-19 ENCOUNTER — Other Ambulatory Visit: Payer: Self-pay

## 2022-01-19 ENCOUNTER — Ambulatory Visit
Admission: RE | Admit: 2022-01-19 | Discharge: 2022-01-19 | Disposition: A | Discharge status: Home / Self Care | Payer: Medicare HMO | Source: Ambulatory Visit | Attending: Family Medicine | Admitting: Family Medicine

## 2022-01-19 DIAGNOSIS — R634 Abnormal weight loss: Secondary | ICD-10-CM | POA: Diagnosis not present

## 2022-01-19 DIAGNOSIS — R102 Pelvic and perineal pain: Secondary | ICD-10-CM | POA: Diagnosis not present

## 2022-01-19 DIAGNOSIS — R1032 Left lower quadrant pain: Secondary | ICD-10-CM

## 2022-01-19 DIAGNOSIS — R109 Unspecified abdominal pain: Secondary | ICD-10-CM | POA: Diagnosis not present

## 2022-01-19 DIAGNOSIS — K8020 Calculus of gallbladder without cholecystitis without obstruction: Secondary | ICD-10-CM | POA: Diagnosis not present

## 2022-01-19 MED ORDER — GLIPIZIDE ER 10 MG PO TB24: ORAL_TABLET | ORAL | 1 refills | Status: DC

## 2022-01-19 MED ORDER — SITAGLIPTIN PHOSPHATE 100 MG PO TABS: 100.0000 mg | ORAL_TABLET | Freq: Every day | ORAL | 1 refills | Status: DC

## 2022-01-22 ENCOUNTER — Other Ambulatory Visit: Payer: Self-pay | Admitting: Family Medicine

## 2022-01-22 DIAGNOSIS — N2889 Other specified disorders of kidney and ureter: Secondary | ICD-10-CM

## 2022-01-22 DIAGNOSIS — K8689 Other specified diseases of pancreas: Secondary | ICD-10-CM

## 2022-01-22 LAB — COMPLETE METABOLIC PANEL WITH GFR
AG Ratio: 1.7 (calc) (ref 1.0–2.5)
ALT: 13 U/L (ref 9–46)
AST: 9 U/L — ABNORMAL LOW (ref 10–35)
Albumin: 3.7 g/dL (ref 3.6–5.1)
Alkaline phosphatase (APISO): 79 U/L (ref 35–144)
BUN: 16 mg/dL (ref 7–25)
CO2: 24 mmol/L (ref 20–32)
Calcium: 10 mg/dL (ref 8.6–10.3)
Chloride: 105 mmol/L (ref 98–110)
Creat: 0.76 mg/dL (ref 0.70–1.35)
Globulin: 2.2 g/dL (calc) (ref 1.9–3.7)
Glucose, Bld: 342 mg/dL — ABNORMAL HIGH (ref 65–99)
Potassium: 4.7 mmol/L (ref 3.5–5.3)
Sodium: 136 mmol/L (ref 135–146)
Total Bilirubin: 0.5 mg/dL (ref 0.2–1.2)
Total Protein: 5.9 g/dL — ABNORMAL LOW (ref 6.1–8.1)
eGFR: 97 mL/min/{1.73_m2} (ref >=60)

## 2022-01-22 LAB — LIPID PANEL
Cholesterol: 131 mg/dL (ref <200)
HDL: 36 mg/dL — ABNORMAL LOW (ref >=40)
LDL Cholesterol (Calc): 78 mg/dL (calc)
Non-HDL Cholesterol (Calc): 95 mg/dL (calc) (ref <130)
Total CHOL/HDL Ratio: 3.6 (calc) (ref <5.0)
Triglycerides: 90 mg/dL (ref <150)

## 2022-01-22 LAB — CBC WITH DIFFERENTIAL/PLATELET
Absolute Monocytes: 906 cells/uL (ref 200–950)
Basophils Absolute: 64 cells/uL (ref 0–200)
Basophils Relative: 0.4 %
Eosinophils Absolute: 64 cells/uL (ref 15–500)
Eosinophils Relative: 0.4 %
HCT: 45.5 % (ref 38.5–50.0)
Hemoglobin: 15.4 g/dL (ref 13.2–17.1)
Lymphs Abs: 2560 cells/uL (ref 850–3900)
MCH: 32 pg (ref 27.0–33.0)
MCHC: 33.8 g/dL (ref 32.0–36.0)
MCV: 94.4 fL (ref 80.0–100.0)
MPV: 10.3 fL (ref 7.5–12.5)
Monocytes Relative: 5.7 %
Neutro Abs: 12307 cells/uL — ABNORMAL HIGH (ref 1500–7800)
Neutrophils Relative %: 77.4 %
Platelets: 289 10*3/uL (ref 140–400)
RBC: 4.82 10*6/uL (ref 4.20–5.80)
RDW: 12.6 % (ref 11.0–15.0)
Total Lymphocyte: 16.1 %
WBC: 15.9 10*3/uL — ABNORMAL HIGH (ref 3.8–10.8)

## 2022-01-22 LAB — LIPASE: Lipase: 69 U/L — ABNORMAL HIGH (ref 7–60)

## 2022-01-22 LAB — HEMOGLOBIN A1C
Hgb A1c MFr Bld: 12.5 % of total Hgb — ABNORMAL HIGH (ref <5.7)
Mean Plasma Glucose: 312 mg/dL
eAG (mmol/L): 17.3 mmol/L

## 2022-01-22 LAB — MICROALBUMIN, URINE

## 2022-01-22 MED ORDER — PANCRELIPASE (LIP-PROT-AMYL) 36000-114000 UNITS PO CPEP: ORAL_CAPSULE | ORAL | 11 refills | Status: DC

## 2022-01-29 ENCOUNTER — Other Ambulatory Visit: Payer: Self-pay | Admitting: Physician Assistant

## 2022-02-01 ENCOUNTER — Ambulatory Visit: Payer: Medicare HMO

## 2022-02-01 ENCOUNTER — Ambulatory Visit (INDEPENDENT_AMBULATORY_CARE_PROVIDER_SITE_OTHER): Payer: Medicare HMO

## 2022-02-01 ENCOUNTER — Other Ambulatory Visit: Payer: Self-pay | Admitting: Cardiovascular Disease

## 2022-02-01 DIAGNOSIS — I739 Peripheral vascular disease, unspecified: Secondary | ICD-10-CM

## 2022-02-02 ENCOUNTER — Telehealth: Payer: Self-pay

## 2022-02-02 DIAGNOSIS — I739 Peripheral vascular disease, unspecified: Secondary | ICD-10-CM

## 2022-02-02 NOTE — Telephone Encounter (Signed)
Patient made aware of PV test results. Pt denies any PAD symptoms at this time. Pt will f/u as planned in Oct. Orders placed for 1 yr repeat testing.

## 2022-02-02 NOTE — Telephone Encounter (Signed)
-----   Message from Wellington Hampshire, MD sent at 02/02/2022 11:21 AM EDT ----- Patent iliac stents.  However, ABI decreased on the left side likely due to SFA disease.  Repeat studies in 1 year.  Keep follow-up with me in October.  If he is having leg symptoms, he should follow-up earlier.

## 2022-02-14 ENCOUNTER — Other Ambulatory Visit: Payer: Self-pay | Admitting: Family Medicine

## 2022-02-14 NOTE — Telephone Encounter (Signed)
Refilled 01/19/2022 #90 1 refill confirmed by pharmacy. Requested Prescriptions  Pending Prescriptions Disp Refills  . glipiZIDE (GLUCOTROL XL) 10 MG 24 hr tablet [Pharmacy Med Name: GLIPIZIDE ER '10MG'$  TABLETS] 90 tablet 1    Sig: TAKE 1 TABLET(10 MG) BY MOUTH DAILY WITH BREAKFAST     Endocrinology:  Diabetes - Sulfonylureas Failed - 02/14/2022  3:39 AM      Failed - HBA1C is between 0 and 7.9 and within 180 days    Hgb A1c MFr Bld  Date Value Ref Range Status  01/18/2022 12.5 (H) <5.7 % of total Hgb Final    Comment:    For someone without known diabetes, a hemoglobin A1c value of 6.5% or greater indicates that they may have  diabetes and this should be confirmed with a follow-up  test. . For someone with known diabetes, a value <7% indicates  that their diabetes is well controlled and a value  greater than or equal to 7% indicates suboptimal  control. A1c targets should be individualized based on  duration of diabetes, age, comorbid conditions, and  other considerations. . Currently, no consensus exists regarding use of hemoglobin A1c for diagnosis of diabetes for children. .          Failed - Valid encounter within last 6 months    Recent Outpatient Visits          1 year ago Uncontrolled type 2 diabetes mellitus with peripheral circulatory disorder ([place])   [place] Susy Frizzle, MD   2 years ago Uncontrolled type 2 diabetes mellitus with peripheral circulatory disorder ([place])   [place] Pickard, Cammie Mcgee, MD   4 years ago PAD (peripheral artery disease) ([place])   [place] Orlena Sheldon, PA-C   5 years ago Diabetes type II with atherosclerosis of arteries of extremities ([place])   [place] Pickard, Cammie Mcgee, MD   5 years ago PVD (peripheral vascular disease) ([place])   [place] Pickard, Cammie Mcgee, MD      Future Appointments            In 1 month [place], Mertie Clause, MD [facility]           [place] in normal range and within 360 days    Creat  Date Value Ref Range Status  01/18/2022 0.76 0.70 - 1.35 mg/dL Final

## 2022-02-17 ENCOUNTER — Other Ambulatory Visit: Payer: Self-pay | Admitting: Family Medicine

## 2022-02-19 NOTE — Telephone Encounter (Signed)
Refilled 01/19/2022 #30 1 refill. Requested Prescriptions  Pending Prescriptions Disp Refills  . JANUVIA 100 MG tablet [Pharmacy Med Name: JANUVIA '100MG'$  TABLETS] 30 tablet 1    Sig: TAKE 1 TABLET(100 MG) BY MOUTH DAILY     Endocrinology:  Diabetes - DPP-4 Inhibitors Failed - 02/17/2022  8:05 AM      Failed - HBA1C is between 0 and 7.9 and within 180 days    Hgb A1c MFr Bld  Date Value Ref Range Status  01/18/2022 12.5 (H) <5.7 % of total Hgb Final    Comment:    For someone without known diabetes, a hemoglobin A1c value of 6.5% or greater indicates that they may have  diabetes and this should be confirmed with a follow-up  test. . For someone with known diabetes, a value <7% indicates  that their diabetes is well controlled and a value  greater than or equal to 7% indicates suboptimal  control. A1c targets should be individualized based on  duration of diabetes, age, comorbid conditions, and  other considerations. . Currently, no consensus exists regarding use of hemoglobin A1c for diagnosis of diabetes for children. .          Failed - Valid encounter within last 6 months    Recent Outpatient Visits          1 year ago Uncontrolled type 2 diabetes mellitus with peripheral circulatory disorder ([place])   [place] Susy Frizzle, MD   2 years ago Uncontrolled type 2 diabetes mellitus with peripheral circulatory disorder ([place])   [place] Susy Frizzle, MD   4 years ago PAD (peripheral artery disease) ([place])   [place], Mary B, PA-C   5 years ago Diabetes type II with atherosclerosis of arteries of extremities ([place])   [place] Pickard, Cammie Mcgee, MD   5 years ago PVD (peripheral vascular disease) ([place])   [place] Pickard, Cammie Mcgee, MD      Future Appointments            In 1 month [place] Anon, Mertie Clause, MD [place]. [facility]            Passed - Cr in normal range and within 360 days    Creat  Date Value Ref Range Status  01/18/2022 0.76 0.70 - 1.35 mg/dL Final

## 2022-02-25 ENCOUNTER — Other Ambulatory Visit: Payer: Self-pay | Admitting: Cardiovascular Disease

## 2022-03-07 DIAGNOSIS — G8929 Other chronic pain: Secondary | ICD-10-CM | POA: Diagnosis not present

## 2022-03-07 DIAGNOSIS — R200 Anesthesia of skin: Secondary | ICD-10-CM | POA: Diagnosis not present

## 2022-03-07 DIAGNOSIS — M545 Low back pain, unspecified: Secondary | ICD-10-CM | POA: Diagnosis not present

## 2022-03-07 DIAGNOSIS — R202 Paresthesia of skin: Secondary | ICD-10-CM | POA: Diagnosis not present

## 2022-03-13 ENCOUNTER — Other Ambulatory Visit: Payer: Self-pay | Admitting: Physician Assistant

## 2022-03-13 DIAGNOSIS — M5416 Radiculopathy, lumbar region: Secondary | ICD-10-CM

## 2022-03-13 DIAGNOSIS — M4807 Spinal stenosis, lumbosacral region: Secondary | ICD-10-CM

## 2022-03-15 DIAGNOSIS — M545 Low back pain, unspecified: Secondary | ICD-10-CM | POA: Diagnosis not present

## 2022-03-15 DIAGNOSIS — Z79899 Other long term (current) drug therapy: Secondary | ICD-10-CM | POA: Diagnosis not present

## 2022-03-15 DIAGNOSIS — G2581 Restless legs syndrome: Secondary | ICD-10-CM | POA: Diagnosis not present

## 2022-03-15 DIAGNOSIS — G603 Idiopathic progressive neuropathy: Secondary | ICD-10-CM | POA: Diagnosis not present

## 2022-03-15 DIAGNOSIS — M542 Cervicalgia: Secondary | ICD-10-CM | POA: Diagnosis not present

## 2022-03-22 ENCOUNTER — Telehealth: Payer: Self-pay

## 2022-03-22 ENCOUNTER — Other Ambulatory Visit: Payer: Self-pay | Admitting: Family Medicine

## 2022-03-22 MED ORDER — AMOXICILLIN-POT CLAVULANATE 875-125 MG PO TABS: 1.0000 | ORAL_TABLET | Freq: Two times a day (BID) | ORAL | 0 refills | Status: DC

## 2022-03-22 NOTE — Telephone Encounter (Signed)
Pt called with c/o diverticulitis pain. Pt states it is in the same spot it was in last time. Pt asks if abx can be sent in to treat? Thank you.

## 2022-03-23 ENCOUNTER — Other Ambulatory Visit: Payer: Self-pay | Admitting: Family Medicine

## 2022-03-23 MED ORDER — ONDANSETRON HCL 4 MG PO TABS: 4.0000 mg | ORAL_TABLET | Freq: Three times a day (TID) | ORAL | 0 refills | Status: DC | PRN

## 2022-03-23 NOTE — Telephone Encounter (Signed)
Requested Prescriptions  Pending Prescriptions Disp Refills  . JANUVIA 100 MG tablet [Pharmacy Med Name: JANUVIA '100MG'$  TABLETS] 30 tablet 1    Sig: TAKE 1 TABLET(100 MG) BY MOUTH DAILY     Endocrinology:  Diabetes - DPP-4 Inhibitors Failed - 03/23/2022  9:47 AM      Failed - HBA1C is between 0 and 7.9 and within 180 days    Hgb A1c MFr Bld  Date Value Ref Range Status  01/18/2022 12.5 (H) <5.7 % of total Hgb Final    Comment:    For someone without known diabetes, a hemoglobin A1c value of 6.5% or greater indicates that they may have  diabetes and this should be confirmed with a follow-up  test. . For someone with known diabetes, a value <7% indicates  that their diabetes is well controlled and a value  greater than or equal to 7% indicates suboptimal  control. A1c targets should be individualized based on  duration of diabetes, age, comorbid conditions, and  other considerations. . Currently, no consensus exists regarding use of hemoglobin A1c for diagnosis of diabetes for children. .          Failed - Valid encounter within last 6 months    Recent Outpatient Visits          1 year ago Uncontrolled type 2 diabetes mellitus with peripheral circulatory disorder ([place])   [place] Susy Frizzle, MD   2 years ago Uncontrolled type 2 diabetes mellitus with peripheral circulatory disorder ([place])   [place] Susy Frizzle, MD   4 years ago PAD (peripheral artery disease) ([place])   [place], Mary B, PA-C   5 years ago Diabetes type II with atherosclerosis of arteries of extremities ([place])   [place] Pickard, Cammie Mcgee, MD   5 years ago PVD (peripheral vascular disease) ([place])   [place] Pickard, Cammie Mcgee, MD      Future Appointments            In 1 week [place] Anon, Mertie Clause, MD [place]. [facility]           Passed - Cr in  normal range and within 360 days    Creat  Date Value Ref Range Status  01/18/2022 0.76 0.70 - 1.35 mg/dL Final

## 2022-03-30 ENCOUNTER — Ambulatory Visit: Payer: Medicare HMO | Attending: Cardiovascular Disease | Admitting: Cardiovascular Disease

## 2022-03-30 NOTE — Progress Notes (Deleted)
Cardiology Office Note   Date:  03/30/2022   ID:  Oscar Mills, Oscar Mills 13-Dec-1950, MRN [id number]  PCP:  Susy Frizzle, MD  Cardiologist:   [name], MD   No chief complaint on file.     History of Present Illness: Oscar Mills is a 71 y.o. male who presents for a followup visit regarding peripheral arterial disease. He is a smoker with hypertension and a family history of heart disease. He has known occlusion of the right SFA and proximal popliteal artery. Noninvasive vascular evaluation in June 2015 showed an ABI of 0.44 on the right and 1.2 on the left.  He was treated medically with cilostazol with improvement in symptoms.   He was seen in April of 2021 for rest pain affecting the right lower extremity.  He was suspected of having iliac occlusion given inability to palpate right femoral pulse.  He was also diagnosed around that time with type 2 diabetes. Angiography in May of 2021 showed flush occlusion of the right common iliac artery with significant disease in the proximal external iliac artery, diffuse subocclusive disease in the SFA with short occlusion in the midsegment and another occlusion of the popliteal artery with three-vessel runoff below the knee.  On the left side there was severe left common iliac artery stenosis.  I performed successful complex revascularization of the right common iliac artery and external iliac artery as well as left common iliac artery with kissing stent placement bilaterally extending into the distal aorta.  He had recurrent right leg claudication in early 2022.  Angiography in January 2022 showed patent bilateral common iliac artery stents with minimal restenosis with severe subocclusive disease in the right mid external iliac artery.  I performed successful angioplasty and self-expanding stent placement to the right external iliac artery. Most recent Doppler studies in August showed an ABI of 0.53 on the right and 0.66 on the  left.  Right ABI was stable but the left decreased from normal in the past.  Duplex showed patent iliac stents.  Duplex on the left showed borderline significant stenosis in the common femoral artery and severe stenosis in the proximal SFA.   Past Medical History:  Diagnosis Date   Arthritis    NECK   Diabetes mellitus, type II ([place])    Dysfunctional alcohol use    now quit   GERD (gastroesophageal reflux disease)    Hypertension    Neuropathy    feet   Pancreatitis    Peripheral vascular disease (HCC)    Stress fracture of ankle     Past Surgical History:  Procedure Laterality Date   ABDOMINAL AORTOGRAM W/LOWER EXTREMITY Bilateral 10/28/2019   Procedure: ABDOMINAL AORTOGRAM W/LOWER EXTREMITY;  Surgeon: Wellington Hampshire, MD;  Location: [facility];  Service: Cardiovascular;  Laterality: Bilateral;   ABDOMINAL AORTOGRAM W/LOWER EXTREMITY Bilateral 06/29/2020   Procedure: ABDOMINAL AORTOGRAM W/LOWER EXTREMITY;  Surgeon: Wellington Hampshire, MD;  Location: [facility];  Service: Cardiovascular;  Laterality: Bilateral;   CATARACT EXTRACTION W/PHACO Left 09/03/2019   Procedure: CATARACT EXTRACTION PHACO AND INTRAOCULAR LENS PLACEMENT (IOC) LEFT VISION BLUE 9.84 00:54.9 17.9%;  Surgeon: Marchia Meiers, MD;  Location: [facility];  Service: Ophthalmology;  Laterality: Left;  Diabetic - diet controlled   LOWER EXTREMITY ANGIOGRAM Bilateral 02/24/2014   Procedure: LOWER EXTREMITY ANGIOGRAM;  Surgeon: Wellington Hampshire, MD;  Location: [facility];  Service: Cardiovascular;  Laterality: Bilateral;   PERIPHERAL VASCULAR CATHETERIZATION  PERIPHERAL VASCULAR INTERVENTION  10/28/2019   Procedure: PERIPHERAL VASCULAR INTERVENTION;  Surgeon: Wellington Hampshire, MD;  Location: [facility];  Service: Cardiovascular;;   PERIPHERAL VASCULAR INTERVENTION  06/29/2020   Procedure: PERIPHERAL VASCULAR INTERVENTION;  Surgeon: Wellington Hampshire, MD;  Location: [facility];  Service:  Cardiovascular;;   UMBILICAL HERNIA REPAIR N/A 03/05/2017   Procedure: HERNIA REPAIR UMBILICAL ADULT;  Surgeon: Leonie Green, MD;  Location: [facility];  Service: General;  Laterality: N/A;     Current Outpatient Medications  Medication Sig Dispense Refill   acetaminophen (TYLENOL) 500 MG tablet Take 1,000 mg by mouth every 6 (six) hours as needed for moderate pain.     amLODipine (NORVASC) 10 MG tablet TAKE 1 TABLET(10 MG) BY MOUTH DAILY 90 tablet 0   amoxicillin-clavulanate (AUGMENTIN) 875-125 MG tablet Take 1 tablet by mouth 2 (two) times daily. 20 tablet 0   aspirin 81 MG tablet Take 81 mg by mouth daily.     Blood Glucose Monitoring Suppl (ACCU-CHEK AVIVA PLUS) w/Device KIT Check BS bid 1 kit 0   clopidogrel (PLAVIX) 75 MG tablet TAKE 1 TABLET(75 MG) BY MOUTH DAILY WITH BREAKFAST 30 tablet 0   gabapentin (NEURONTIN) 300 MG capsule Take 300 mg by mouth 3 (three) times daily.  0   glipiZIDE (GLUCOTROL XL) 10 MG 24 hr tablet TAKE 1 TABLET(10 MG) BY MOUTH DAILY WITH BREAKFAST 90 tablet 1   glucose blood (ACCU-CHEK AVIVA PLUS) test strip Check BS BID - new onset dm E11.9 100 each 12   HYDROcodone-acetaminophen (NORCO) 7.5-325 MG tablet Take 1 tablet by mouth 3 (three) times daily as needed.     JANUVIA 100 MG tablet TAKE 1 TABLET(100 MG) BY MOUTH DAILY 30 tablet 1   Lancets (ACCU-CHEK MULTICLIX) lancets Check BS bid - New onset DM E11.9 100 each 12   lipase/protease/amylase (CREON) 36000 UNITS CPEP capsule Take 2 capsules (72,000 Units total) by mouth 3 (three) times daily with meals. May also take 1 capsule (36,000 Units total) as needed (with snacks). 240 capsule 11   metFORMIN (GLUCOPHAGE) 500 MG tablet TAKE 2 TABLETS(1000 MG) BY MOUTH TWICE DAILY WITH A MEAL 120 tablet 0   ondansetron (ZOFRAN) 4 MG tablet Take 1 tablet (4 mg total) by mouth every 8 (eight) hours as needed for nausea or vomiting. 20 tablet 0   pantoprazole (PROTONIX) 40 MG tablet TAKE 1 TABLET BY MOUTH EVERY DAY 90  tablet 2   rosuvastatin (CRESTOR) 10 MG tablet TAKE 1 TABLET(10 MG) BY MOUTH DAILY 90 tablet 0   tiZANidine (ZANAFLEX) 4 MG tablet  (Patient not taking: Reported on 01/18/2022)     No current facility-administered medications for this visit.    Allergies:   Iohexol    Social History:  The patient  reports that he has been smoking cigarettes. He has a 20.00 pack-year smoking history. He has never used smokeless tobacco. He reports that he does not currently use alcohol. He reports that he does not use drugs.   Family History:  The patient's family history includes Heart attack in his father.    ROS:  Please see the history of present illness.   Otherwise, review of systems are positive for none.   All other systems are reviewed and negative.    PHYSICAL EXAM: VS:  There were no vitals taken for this visit. , BMI There is no height or weight on file to calculate BMI. GEN: Well nourished, well developed, in no acute  distress  HEENT: normal  Neck: no JVD, carotid bruits, or masses Cardiac: RRR; no murmurs, rubs, or gallops,no edema  Respiratory:  clear to auscultation bilaterally, normal work of breathing GI: soft, nontender, nondistended, + BS MS: no deformity or atrophy  Skin: warm and dry, no rash Neuro:  Strength and sensation are intact Psych: euthymic mood, full affect Vascular: Femoral pulse: +1 on the right and +2 on the left.  Distal pulses are +2 on the left and not palpable on the right.   EKG:  EKG is ordered today. The ekg ordered today demonstrates normal sinus rhythm with PACs.   Recent Labs: 01/18/2022: ALT 13; BUN 16; Creat 0.76; Hemoglobin 15.4; Platelets 289; Potassium 4.7; Sodium 136    Lipid Panel    Component Value Date/Time   CHOL 131 01/18/2022 1121   CHOL 94 (L) 05/14/2014 0829   TRIG 90 01/18/2022 1121   HDL 36 (L) 01/18/2022 1121   HDL 38 (L) 05/14/2014 0829   CHOLHDL 3.6 01/18/2022 1121   VLDL 15 10/22/2019 1726   LDLCALC 78 01/18/2022 1121       Wt Readings from Last 3 Encounters:  01/18/22 133 lb (60.3 kg)  03/14/21 162 lb (73.5 kg)  09/19/20 167 lb (75.8 kg)           No data to display            ASSESSMENT AND PLAN:  1.  Peripheral arterial disease with claudication: Status post complex revascularization of his iliac arteries in May of this year.  Unfortunately, he has now recurrent right leg claudication with evidence of severe stenosis in the right external iliac artery which threatens the patency of the right iliac stents.  Given his symptoms and duplex findings, I recommend proceeding with angiography and possible endovascular intervention.  I discussed the procedure in details as well as risk and benefits.  Planned access can be via the right common femoral artery or radial artery.  2. Essential hypertension: Blood pressure is reasonably controlled on current medications  3. Hyperlipidemia: He reports stopping atorvastatin because it bothered his stomach.  I discussed with him the importance of taking a statin medication given his extensive peripheral arterial disease.  I elected to start him on rosuvastatin 10 mg daily instead.   4. Tobacco use: I again discussed the importance of smoking cessation.  5.  Type 2 diabetes: Managed by his primary care physician.   Disposition:   FU with me in 2 months  Signed,  [name], MD  03/30/2022 7:49 AM    [place]

## 2022-04-02 ENCOUNTER — Encounter: Payer: Self-pay | Admitting: Cardiovascular Disease

## 2022-04-09 ENCOUNTER — Other Ambulatory Visit: Payer: Self-pay | Admitting: Cardiovascular Disease

## 2022-04-14 ENCOUNTER — Other Ambulatory Visit: Payer: Self-pay | Admitting: Cardiovascular Disease

## 2022-04-29 ENCOUNTER — Other Ambulatory Visit: Payer: Self-pay | Admitting: Physician Assistant

## 2022-04-29 ENCOUNTER — Other Ambulatory Visit: Payer: Self-pay | Admitting: Cardiovascular Disease

## 2022-05-02 ENCOUNTER — Other Ambulatory Visit: Payer: Self-pay | Admitting: Cardiovascular Disease

## 2022-05-25 ENCOUNTER — Other Ambulatory Visit: Payer: Self-pay | Admitting: Physician Assistant

## 2022-05-26 ENCOUNTER — Other Ambulatory Visit: Payer: Self-pay | Admitting: Physician Assistant

## 2022-05-30 ENCOUNTER — Other Ambulatory Visit: Payer: Self-pay

## 2022-05-30 ENCOUNTER — Telehealth: Payer: Self-pay | Admitting: Cardiovascular Disease

## 2022-05-30 MED ORDER — AMLODIPINE BESYLATE 10 MG PO TABS: ORAL_TABLET | ORAL | 0 refills | Status: DC

## 2022-05-30 NOTE — Telephone Encounter (Signed)
Disp Refills Start End   amLODipine (NORVASC) 10 MG tablet 90 tablet 0 05/30/2022    Sig: TAKE 1 TABLET(10 MG) BY MOUTH DAILY   Sent to pharmacy as: amLODipine (NORVASC) 10 MG tablet   E-Prescribing Status: Sent to pharmacy (05/30/2022  8:39 AM EST)    Pharmacy  [id number], [place]

## 2022-05-30 NOTE — Telephone Encounter (Signed)
*  STAT* If patient is at the pharmacy, call can be transferred to refill team.   1. Which medications need to be refilled? (please list name of each medication and dose if known) amLODipine (NORVASC) 10 MG tablet   2. Which pharmacy/location (including street and city if local pharmacy) is medication to be sent to?  [facility] [place], [place]   3. Do they need a 30 day or 90 day supply? [place]

## 2022-05-31 ENCOUNTER — Telehealth: Payer: Self-pay | Admitting: Cardiovascular Disease

## 2022-05-31 MED ORDER — CLOPIDOGREL BISULFATE 75 MG PO TABS: ORAL_TABLET | ORAL | 0 refills | Status: DC

## 2022-05-31 NOTE — Telephone Encounter (Signed)
*  STAT* If patient is at the pharmacy, call can be transferred to refill team.   1. Which medications need to be refilled? (please list name of each medication and dose if known) Clopidogrel  2. Which pharmacy/location (including street and city if local pharmacy) is medication to be sent to? [facility] [place]  3. Do they need a 30 day or 90 day supply? 90 days  and refills

## 2022-05-31 NOTE — Telephone Encounter (Signed)
Requested Prescriptions   Signed Prescriptions Disp Refills   clopidogrel (PLAVIX) 75 MG tablet 30 tablet 0    Sig: TAKE 1 TABLET(75 MG) BY MOUTH DAILY WITH BREAKFAST    Authorizing Provider: [name] A    Ordering User: Britt Bottom

## 2022-06-07 DIAGNOSIS — M5442 Lumbago with sciatica, left side: Secondary | ICD-10-CM | POA: Diagnosis not present

## 2022-06-08 ENCOUNTER — Telehealth: Payer: Self-pay | Admitting: Cardiovascular Disease

## 2022-06-08 NOTE — Telephone Encounter (Signed)
Received call directly from operator and spoke with dental hygienist regarding tooth extractions.   Primary Cardiologist: [name], MD  Chart reviewed as part of pre-operative protocol coverage. Simple dental extractions are considered low risk procedures per guidelines and generally do not require any specific cardiac clearance. It is also generally accepted that for simple extractions and dental cleanings, there is no need to interrupt blood thinner therapy.   Patient take clopidogrel for previous iliac stent, not coumadin. SBE prophylaxis is not required for the patient.  I will route this recommendation to the requesting party via Epic fax function and remove from pre-op pool.  Please call with questions.  Emmaline Life, NP-C  06/08/2022, 8:51 AM [address], [address] Office [phone] Fax [phone]

## 2022-06-08 NOTE — Telephone Encounter (Signed)
   Pre-operative Risk Assessment    Patient Name: Oscar Mills  DOB: March 27, 1951 MRN: [id number]     Request for Surgical Clearance    Procedure:  Dental Extraction - Amount of Teeth to be Pulled:  2 18 and 19 lower right molars   Date of Surgery:  Clearance 06/08/22                                 Surgeon:  Cathlean Sauer  Surgeon's Group or Practice Name:  [facility]  Phone number:  [phone] Fax number:  [phone]   Type of Clearance Requested:   - Medical  - Pharmacy:  Hold Warfarin (Coumadin) TBD by Cardiology    Type of Anesthesia:  Local    Additional requests/questions:  Does this patient need antibiotics?  Crist Infante   06/08/2022, 8:38 AM

## 2022-06-21 ENCOUNTER — Other Ambulatory Visit: Payer: Self-pay | Admitting: Family Medicine

## 2022-06-28 ENCOUNTER — Other Ambulatory Visit: Payer: Self-pay | Admitting: Cardiovascular Disease

## 2022-06-28 NOTE — Telephone Encounter (Signed)
Patient is scheduled 1/25

## 2022-06-28 NOTE — Telephone Encounter (Signed)
Please contact pt for future appointment. Pt overdue for 12 month f/u. Pt needing refills.

## 2022-07-12 ENCOUNTER — Other Ambulatory Visit: Payer: Self-pay | Admitting: Cardiovascular Disease

## 2022-07-19 ENCOUNTER — Encounter: Payer: Self-pay | Admitting: Cardiovascular Disease

## 2022-07-19 ENCOUNTER — Ambulatory Visit: Payer: Medicare HMO | Attending: Cardiovascular Disease | Admitting: Cardiovascular Disease

## 2022-07-19 VITALS — BP 110/50 | HR 85 | Ht 74.0 in | Wt 140.0 lb

## 2022-07-19 DIAGNOSIS — Z720 Tobacco use: Secondary | ICD-10-CM | POA: Diagnosis not present

## 2022-07-19 DIAGNOSIS — I10 Essential (primary) hypertension: Secondary | ICD-10-CM

## 2022-07-19 DIAGNOSIS — I739 Peripheral vascular disease, unspecified: Secondary | ICD-10-CM

## 2022-07-19 DIAGNOSIS — E785 Hyperlipidemia, unspecified: Secondary | ICD-10-CM | POA: Diagnosis not present

## 2022-07-19 MED ORDER — PANTOPRAZOLE SODIUM 40 MG PO TBEC: 40.0000 mg | DELAYED_RELEASE_TABLET | Freq: Every day | ORAL | 3 refills | Status: DC

## 2022-07-19 MED ORDER — AMLODIPINE BESYLATE 10 MG PO TABS: ORAL_TABLET | ORAL | 3 refills | Status: DC

## 2022-07-19 MED ORDER — ROSUVASTATIN CALCIUM 10 MG PO TABS: 10.0000 mg | ORAL_TABLET | Freq: Every day | ORAL | 3 refills | Status: DC

## 2022-07-19 MED ORDER — CLOPIDOGREL BISULFATE 75 MG PO TABS: 75.0000 mg | ORAL_TABLET | Freq: Every day | ORAL | 3 refills | Status: DC

## 2022-07-19 NOTE — Patient Instructions (Signed)
Medication Instructions:  No changes-refills sent *If you need a refill on your cardiac medications before your next appointment, please call your pharmacy*   Lab Work: None ordered If you have labs (blood work) drawn today and your tests are completely normal, you will receive your results only by: [place] (if you have MyChart) OR A paper copy in the mail If you have any lab test that is abnormal or we need to change your treatment, we will call you to review the results.   Testing/Procedures: Your physician has recommended that you have an AORTA/ILIAC Duplex in August. This is a noninvasive diagnostic test that uses ultrasound technology to look at the aorta and iliac arteries to detect any restriction to blood flow to the buttocks, groin, legs or feet.  No food after 11PM the night before.  Water is OK. (Don't drink liquids if you have been instructed not to for ANOTHER test). Avoid foods that produce bowel gas, for 24 hours prior to exam (see below). No breakfast, no chewing gum, no smoking or carbonated beverages. Patient may take morning medications with water. Come in for test at least 15 minutes early to register. This will take approximately 60 minutes This will take place at [facility] ([place]) [phone], [place]  Your physician has requested that you have an ankle brachial index (ABI) in August. During this test an ultrasound and blood pressure cuff are used to evaluate the arteries that supply the arms and legs with blood.  Allow thirty minutes for this exam.  There are no restrictions or special instructions.  This will take place at [facility] ([place]) [address], [place]    Follow-Up: At [facility], you and your health needs are our priority.  As part of our continuing mission to provide you with exceptional heart care, we have created designated Provider Care Teams.  These Care Teams include  your primary Cardiologist (physician) and Advanced Practice Providers (APPs -  Physician Assistants and Nurse Practitioners) who all work together to provide you with the care you need, when you need it.  We recommend signing up for the patient portal called "MyChart".  Sign up information is provided on this After Visit Summary.  MyChart is used to connect with patients for Virtual Visits (Telemedicine).  Patients are able to view lab/test results, encounter notes, upcoming appointments, etc.  Non-urgent messages can be sent to your provider as well.   To learn more about what you can do with MyChart, go to [email or web address].    Your next appointment:   12 month(s)  Provider:   You may see [name], MD or one of the following Advanced Practice Providers on your designated Care Team:   Murray Hodgkins, NP Christell Faith, PA-C Cadence Kathlen Mody, PA-C Gerrie Nordmann, NP

## 2022-07-19 NOTE — Progress Notes (Signed)
Cardiology Office Note   Date:  07/19/2022   ID:  Oscar Mills, [name] June 15, 1951, MRN [id number]  PCP:  Susy Frizzle, MD  Cardiologist:   [name], MD   Chief Complaint  Patient presents with   Follow-up    12 month f/u, no new cardiac concerns       History of Present Illness: Oscar Mills is a 72 y.o. male who presents for a followup visit regarding peripheral arterial disease. He is a smoker with hypertension, type 2 diabetes and a family history of heart disease. He has known occlusion of the right SFA and proximal popliteal artery. Noninvasive vascular evaluation in June 2015 indicated an ABI of 0.44 on the right and 1.2 on the left.  He was initially treated medically with cilostazol but developed rest pain in April 2021. Angiography in May of 2021 showed flush occlusion of the right common iliac artery with significant disease in the proximal external iliac artery, diffuse subocclusive disease in the SFA with short occlusion in the midsegment and another occlusion of the popliteal artery with three-vessel runoff below the knee.  On the left side there was severe left common iliac artery stenosis.  I performed successful complex revascularization of the right common iliac artery and external iliac artery as well as left common iliac artery with kissing stent placement bilaterally extending into the distal aorta.  He had recurrent right leg claudication in late 2021.  Angiography was done in January 2022 which showed patent bilateral common iliac artery stents with minimal restenosis.  However, there was severe subtotal occlusion in the right mid external iliac artery with occluded internal iliac artery.  I performed successful angioplasty and self-expanding stent placement to the right external iliac artery. Most recent Doppler studies in August 2023 showed an ABI of 0.53 on the right and 0.66 on the left.  Duplex showed patent iliac stents.  The drop in ABI on  the left side was due to left SFA disease.  He has been doing well overall with no chest pain, shortness of breath or palpitations.  He reports no significant leg claudication in spite of his SFA disease.  He cut down on tobacco use to less than half a pack per day.  Past Medical History:  Diagnosis Date   Arthritis    NECK   Diabetes mellitus, type II ([place])    Dysfunctional alcohol use    now quit   GERD (gastroesophageal reflux disease)    Hypertension    Neuropathy    feet   Pancreatitis    Peripheral vascular disease (HCC)    Stress fracture of ankle     Past Surgical History:  Procedure Laterality Date   ABDOMINAL AORTOGRAM W/LOWER EXTREMITY Bilateral 10/28/2019   Procedure: ABDOMINAL AORTOGRAM W/LOWER EXTREMITY;  Surgeon: Wellington Hampshire, MD;  Location: [facility];  Service: Cardiovascular;  Laterality: Bilateral;   ABDOMINAL AORTOGRAM W/LOWER EXTREMITY Bilateral 06/29/2020   Procedure: ABDOMINAL AORTOGRAM W/LOWER EXTREMITY;  Surgeon: Wellington Hampshire, MD;  Location: [facility];  Service: Cardiovascular;  Laterality: Bilateral;   CATARACT EXTRACTION W/PHACO Left 09/03/2019   Procedure: CATARACT EXTRACTION PHACO AND INTRAOCULAR LENS PLACEMENT (IOC) LEFT VISION BLUE 9.84 00:54.9 17.9%;  Surgeon: Marchia Meiers, MD;  Location: [facility];  Service: Ophthalmology;  Laterality: Left;  Diabetic - diet controlled   LOWER EXTREMITY ANGIOGRAM Bilateral 02/24/2014   Procedure: LOWER EXTREMITY ANGIOGRAM;  Surgeon: Wellington Hampshire, MD;  [facility];  Service: Cardiovascular;  Laterality: Bilateral;   PERIPHERAL VASCULAR CATHETERIZATION     PERIPHERAL VASCULAR INTERVENTION  10/28/2019   Procedure: PERIPHERAL VASCULAR INTERVENTION;  Surgeon: Wellington Hampshire, MD;  Location: [facility];  Service: Cardiovascular;;   PERIPHERAL VASCULAR INTERVENTION  06/29/2020   Procedure: PERIPHERAL VASCULAR INTERVENTION;  Surgeon: Wellington Hampshire, MD;  Location: [facility];  Service: Cardiovascular;;   UMBILICAL HERNIA REPAIR N/A 03/05/2017   Procedure: HERNIA REPAIR UMBILICAL ADULT;  Surgeon: Leonie Green, MD;  Location: [facility];  Service: General;  Laterality: N/A;     Current Outpatient Medications  Medication Sig Dispense Refill   acetaminophen (TYLENOL) 500 MG tablet Take 1,000 mg by mouth every 6 (six) hours as needed for moderate pain.     amLODipine (NORVASC) 10 MG tablet TAKE 1 TABLET(10 MG) BY MOUTH DAILY 90 tablet 0   amoxicillin (AMOXIL) 875 MG tablet SMARTSIG:1 Tablet(s) By Mouth     amoxicillin-clavulanate (AUGMENTIN) 875-125 MG tablet Take 1 tablet by mouth 2 (two) times daily. 20 tablet 0   aspirin 81 MG tablet Take 81 mg by mouth daily.     Blood Glucose Monitoring Suppl (ACCU-CHEK AVIVA PLUS) w/Device KIT Check BS bid 1 kit 0   clopidogrel (PLAVIX) 75 MG tablet TAKE 1 TABLET(75 MG) BY MOUTH DAILY WITH BREAKFAST. PLEASE KEEP APPOINTMENT ON 07/19/22 FOR FURTHER REFILLS. 30 tablet 0   gabapentin (NEURONTIN) 300 MG capsule Take 300 mg by mouth 3 (three) times daily.  0   glipiZIDE (GLUCOTROL XL) 10 MG 24 hr tablet TAKE 1 TABLET(10 MG) BY MOUTH DAILY WITH BREAKFAST 90 tablet 1   glucose blood (ACCU-CHEK AVIVA PLUS) test strip Check BS BID - new onset dm E11.9 100 each 12   HYDROcodone-acetaminophen (NORCO) 7.5-325 MG tablet Take 1 tablet by mouth 3 (three) times daily as needed.     JANUVIA 100 MG tablet TAKE 1 TABLET(100 MG) BY MOUTH DAILY 30 tablet 1   Lancets (ACCU-CHEK MULTICLIX) lancets Check BS bid - New onset DM E11.9 100 each 12   lipase/protease/amylase (CREON) 36000 UNITS CPEP capsule Take 2 capsules (72,000 Units total) by mouth 3 (three) times daily with meals. May also take 1 capsule (36,000 Units total) as needed (with snacks). 240 capsule 11   metFORMIN (GLUCOPHAGE) 500 MG tablet TAKE 2 TABLETS(1000 MG) BY MOUTH TWICE DAILY WITH A MEAL 120 tablet 0   ondansetron (ZOFRAN) 4 MG tablet Take 1 tablet (4 mg total) by  mouth every 8 (eight) hours as needed for nausea or vomiting. 20 tablet 0   pantoprazole (PROTONIX) 40 MG tablet TAKE 1 TABLET BY MOUTH EVERY DAY 90 tablet 0   rosuvastatin (CRESTOR) 10 MG tablet Take 1 tablet (10 mg total) by mouth daily. PLEASE KEEP APPOINTMENT ON 07/19/22 FOR FURTHER REFILLS. THANK YOU. 30 tablet 0   tiZANidine (ZANAFLEX) 4 MG tablet      No current facility-administered medications for this visit.    Allergies:   Iohexol    Social History:  The patient  reports that he has been smoking cigarettes. He has a 20.00 pack-year smoking history. He has never used smokeless tobacco. He reports that he does not currently use alcohol. He reports that he does not use drugs.   Family History:  The patient's family history includes Heart attack in his father.    ROS:  Please see the history of present illness.   Otherwise, review of systems are positive for none.  All other systems are reviewed and negative.    PHYSICAL EXAM: VS:  BP (!) 110/50 (BP Location: Left Arm, Patient Position: Sitting, Cuff Size: Normal)   Pulse 85   Ht '6\' 2"'$  (1.88 m)   Wt 140 lb (63.5 kg)   SpO2 96%   BMI 17.97 kg/m  , BMI Body mass index is 17.97 kg/m. GEN: Well nourished, well developed, in no acute distress  HEENT: normal  Neck: no JVD, carotid bruits, or masses Cardiac: RRR; no murmurs, rubs, or gallops,no edema  Respiratory:  clear to auscultation bilaterally, normal work of breathing GI: soft, nontender, nondistended, + BS MS: no deformity or atrophy  Skin: warm and dry, no rash Neuro:  Strength and sensation are intact Psych: euthymic mood, full affect    EKG:  EKG is ordered today. The ekg ordered today demonstrates normal sinus rhythm with PACs.   Recent Labs: 01/18/2022: ALT 13; BUN 16; Creat 0.76; Hemoglobin 15.4; Platelets 289; Potassium 4.7; Sodium 136    Lipid Panel    Component Value Date/Time   CHOL 131 01/18/2022 1121   CHOL 94 (L) 05/14/2014 0829   TRIG 90  01/18/2022 1121   HDL 36 (L) 01/18/2022 1121   HDL 38 (L) 05/14/2014 0829   CHOLHDL 3.6 01/18/2022 1121   VLDL 15 10/22/2019 1726   LDLCALC 78 01/18/2022 1121      Wt Readings from Last 3 Encounters:  07/19/22 140 lb (63.5 kg)  01/18/22 133 lb (60.3 kg)  03/14/21 162 lb (73.5 kg)           No data to display            ASSESSMENT AND PLAN:  1.  Peripheral arterial disease : Status post bilateral iliac artery stent placement.  Currently with no recurrent claudication and spite of bilateral SFA disease.   Recommend continuing medical therapy.  I am going to keep him on long-term dual antiplatelet therapy as tolerated. Repeat ABI and aortoiliac duplex in August.  2. Essential hypertension: Blood pressure is reasonably controlled on current medications.  Amlodipine was refilled.  3. Hyperlipidemia: He did not tolerate atorvastatin in the past due to reported stomach pain.  He was given rosuvastatin 10 mg daily during last visit but has not been taking the medication.  He reports no side effects with it and I explained to him the rationale for treatment with a statin.  He is agreeable to proceed and we will refill this medication.   4. Tobacco use: I again discussed the importance of smoking cessation.  5.  Type 2 diabetes: Managed by his primary care physician.   Disposition:   FU with me in 12 months  Signed,  [name], MD  07/19/2022 8:12 AM    [place]

## 2022-07-23 NOTE — Patient Instructions (Incomplete)
Mr. Oscar Mills , Thank you for taking time to come for your Medicare Wellness Visit. I appreciate your ongoing commitment to your health goals. Please review the following plan we discussed and let me know if I can assist you in the future.   These are the goals we discussed:  Goals   None     This is a list of the screening recommended for you and due dates:  Health Maintenance  Topic Date Due   Medicare Annual Wellness Visit  Never done   COVID-19 Vaccine (1) Never done   Pneumonia Vaccine (1 - PCV) Never done   Complete foot exam   Never done   Eye exam for diabetics  Never done   Yearly kidney health urinalysis for diabetes  Never done   DTaP/Tdap/Td vaccine (1 - Tdap) Never done   Zoster (Shingles) Vaccine (1 of 2) Never done   Colon Cancer Screening  Never done   Screening for Lung Cancer  02/17/2007   Flu Shot  Never done   Hemoglobin A1C  07/21/2022   Yearly kidney function blood test for diabetes  01/19/2023   Hepatitis C Screening: USPSTF Recommendation to screen - Ages 18-79 yo.  Completed   HPV Vaccine  Aged Out    Advanced directives: ***  Conditions/risks identified: Aim for 30 minutes of exercise or brisk walking, 6-8 glasses of water, and 5 servings of fruits and vegetables each day.   Next appointment: Follow up in one year for your annual wellness visit.   Preventive Care 34 Years and Older, Male  Preventive care refers to lifestyle choices and visits with your health care provider that can promote health and wellness. What does preventive care include? A yearly physical exam. This is also called an annual well check. Dental exams once or twice a year. Routine eye exams. Ask your health care provider how often you should have your eyes checked. Personal lifestyle choices, including: Daily care of your teeth and gums. Regular physical activity. Eating a healthy diet. Avoiding tobacco and drug use. Limiting alcohol use. Practicing safe sex. Taking low  doses of aspirin every day. Taking vitamin and mineral supplements as recommended by your health care provider. What happens during an annual well check? The services and screenings done by your health care provider during your annual well check will depend on your age, overall health, lifestyle risk factors, and family history of disease. Counseling  Your health care provider may ask you questions about your: Alcohol use. Tobacco use. Drug use. Emotional well-being. Home and relationship well-being. Sexual activity. Eating habits. History of falls. Memory and ability to understand (cognition). Work and work Statistician. Screening  You may have the following tests or measurements: Height, weight, and BMI. Blood pressure. Lipid and cholesterol levels. These may be checked every 5 years, or more frequently if you are over 10 years old. Skin check. Lung cancer screening. You may have this screening every year starting at age 43 if you have a 30-pack-year history of smoking and currently smoke or have quit within the past 15 years. Fecal occult blood test (FOBT) of the stool. You may have this test every year starting at age 41. Flexible sigmoidoscopy or colonoscopy. You may have a sigmoidoscopy every 5 years or a colonoscopy every 10 years starting at age 48. Prostate cancer screening. Recommendations will vary depending on your family history and other risks. Hepatitis C blood test. Hepatitis B blood test. Sexually transmitted disease (STD) testing. Diabetes screening. This is  done by checking your blood sugar (glucose) after you have not eaten for a while (fasting). You may have this done every 1-3 years. Abdominal aortic aneurysm (AAA) screening. You may need this if you are a current or former smoker. Osteoporosis. You may be screened starting at age 4 if you are at high risk. Talk with your health care provider about your test results, treatment options, and if necessary, the need  for more tests. Vaccines  Your health care provider may recommend certain vaccines, such as: Influenza vaccine. This is recommended every year. Tetanus, diphtheria, and acellular pertussis (Tdap, Td) vaccine. You may need a Td booster every 10 years. Zoster vaccine. You may need this after age 11. Pneumococcal 13-valent conjugate (PCV13) vaccine. One dose is recommended after age 4. Pneumococcal polysaccharide (PPSV23) vaccine. One dose is recommended after age 80. Talk to your health care provider about which screenings and vaccines you need and how often you need them. This information is not intended to replace advice given to you by your health care provider. Make sure you discuss any questions you have with your health care provider. Document Released: 07/08/2015 Document Revised: 02/29/2016 Document Reviewed: 04/12/2015 Elsevier Interactive Patient Education  2017 [place] Prevention in the Home Falls can cause injuries. They can happen to people of all ages. There are many things you can do to make your home safe and to help prevent falls. What can I do on the outside of my home? Regularly fix the edges of walkways and driveways and fix any cracks. Remove anything that might make you trip as you walk through a door, such as a raised step or threshold. Trim any bushes or trees on the path to your home. Use bright outdoor lighting. Clear any walking paths of anything that might make someone trip, such as rocks or tools. Regularly check to see if handrails are loose or broken. Make sure that both sides of any steps have handrails. Any raised decks and porches should have guardrails on the edges. Have any leaves, snow, or ice cleared regularly. Use sand or salt on walking paths during winter. Clean up any spills in your garage right away. This includes oil or grease spills. What can I do in the bathroom? Use night lights. Install grab bars by the toilet and in the tub and  shower. Do not use towel bars as grab bars. Use non-skid mats or decals in the tub or shower. If you need to sit down in the shower, use a plastic, non-slip stool. Keep the floor dry. Clean up any water that spills on the floor as soon as it happens. Remove soap buildup in the tub or shower regularly. Attach bath mats securely with double-sided non-slip rug tape. Do not have throw rugs and other things on the floor that can make you trip. What can I do in the bedroom? Use night lights. Make sure that you have a light by your bed that is easy to reach. Do not use any sheets or blankets that are too big for your bed. They should not hang down onto the floor. Have a firm chair that has side arms. You can use this for support while you get dressed. Do not have throw rugs and other things on the floor that can make you trip. What can I do in the kitchen? Clean up any spills right away. Avoid walking on wet floors. Keep items that you use a lot in easy-to-reach places. If you need  to reach something above you, use a strong step stool that has a grab bar. Keep electrical cords out of the way. Do not use floor polish or wax that makes floors slippery. If you must use wax, use non-skid floor wax. Do not have throw rugs and other things on the floor that can make you trip. What can I do with my stairs? Do not leave any items on the stairs. Make sure that there are handrails on both sides of the stairs and use them. Fix handrails that are broken or loose. Make sure that handrails are as long as the stairways. Check any carpeting to make sure that it is firmly attached to the stairs. Fix any carpet that is loose or worn. Avoid having throw rugs at the top or bottom of the stairs. If you do have throw rugs, attach them to the floor with carpet tape. Make sure that you have a light switch at the top of the stairs and the bottom of the stairs. If you do not have them, ask someone to add them for  you. What else can I do to help prevent falls? Wear shoes that: Do not have high heels. Have rubber bottoms. Are comfortable and fit you well. Are closed at the toe. Do not wear sandals. If you use a stepladder: Make sure that it is fully opened. Do not climb a closed stepladder. Make sure that both sides of the stepladder are locked into place. Ask someone to hold it for you, if possible. Clearly mark and make sure that you can see: Any grab bars or handrails. First and last steps. Where the edge of each step is. Use tools that help you move around (mobility aids) if they are needed. These include: Canes. Walkers. Scooters. Crutches. Turn on the lights when you go into a dark area. Replace any light bulbs as soon as they burn out. Set up your furniture so you have a clear path. Avoid moving your furniture around. If any of your floors are uneven, fix them. If there are any pets around you, be aware of where they are. Review your medicines with your doctor. Some medicines can make you feel dizzy. This can increase your chance of falling. Ask your doctor what other things that you can do to help prevent falls. This information is not intended to replace advice given to you by your health care provider. Make sure you discuss any questions you have with your health care provider. Document Released: 04/07/2009 Document Revised: 11/17/2015 Document Reviewed: 07/16/2014 Elsevier Interactive Patient Education  2017 [employer].

## 2022-07-23 NOTE — Progress Notes (Unsigned)
Subjective:   Oscar Mills is a 72 y.o. male who presents for Medicare Annual/Subsequent preventive examination.  Review of Systems    ***       Objective:    There were no vitals filed for this visit. There is no height or weight on file to calculate BMI.     06/29/2020    6:24 AM 10/28/2019   10:51 AM 09/03/2019    6:59 AM 02/27/2017   11:47 AM 03/20/2015    6:53 AM 02/24/2014    6:52 AM  Advanced Directives  Does Patient Have a Medical Advance Directive? Yes Yes Yes Yes No Yes  Type of Advance Directive Living will [place];Living will [place];Living will Living will  Living will  Does patient want to make changes to medical advance directive? No - Patient declined  No - Patient declined   No - Patient declined  Copy of [place] in Chart?  No - copy requested No - copy requested   No - copy requested  Would patient like information on creating a medical advance directive?     Yes - Educational materials given     Current Medications (verified) Outpatient Encounter Medications as of 07/24/2022  Medication Sig   acetaminophen (TYLENOL) 500 MG tablet Take 1,000 mg by mouth every 6 (six) hours as needed for moderate pain.   amLODipine (NORVASC) 10 MG tablet TAKE 1 TABLET(10 MG) BY MOUTH DAILY   amoxicillin (AMOXIL) 875 MG tablet SMARTSIG:1 Tablet(s) By Mouth   amoxicillin-clavulanate (AUGMENTIN) 875-125 MG tablet Take 1 tablet by mouth 2 (two) times daily.   aspirin 81 MG tablet Take 81 mg by mouth daily.   Blood Glucose Monitoring Suppl (ACCU-CHEK AVIVA PLUS) w/Device KIT Check BS bid   clopidogrel (PLAVIX) 75 MG tablet Take 1 tablet (75 mg total) by mouth daily.   gabapentin (NEURONTIN) 300 MG capsule Take 300 mg by mouth 3 (three) times daily.   glipiZIDE (GLUCOTROL XL) 10 MG 24 hr tablet TAKE 1 TABLET(10 MG) BY MOUTH DAILY WITH BREAKFAST   glucose blood (ACCU-CHEK AVIVA PLUS) test strip Check BS BID - new onset dm  E11.9   HYDROcodone-acetaminophen (NORCO) 7.5-325 MG tablet Take 1 tablet by mouth 3 (three) times daily as needed.   JANUVIA 100 MG tablet TAKE 1 TABLET(100 MG) BY MOUTH DAILY   Lancets (ACCU-CHEK MULTICLIX) lancets Check BS bid - New onset DM E11.9   lipase/protease/amylase (CREON) 36000 UNITS CPEP capsule Take 2 capsules (72,000 Units total) by mouth 3 (three) times daily with meals. May also take 1 capsule (36,000 Units total) as needed (with snacks).   metFORMIN (GLUCOPHAGE) 500 MG tablet TAKE 2 TABLETS(1000 MG) BY MOUTH TWICE DAILY WITH A MEAL   ondansetron (ZOFRAN) 4 MG tablet Take 1 tablet (4 mg total) by mouth every 8 (eight) hours as needed for nausea or vomiting.   pantoprazole (PROTONIX) 40 MG tablet Take 1 tablet (40 mg total) by mouth daily.   rosuvastatin (CRESTOR) 10 MG tablet Take 1 tablet (10 mg total) by mouth daily.   tiZANidine (ZANAFLEX) 4 MG tablet    No facility-administered encounter medications on file as of 07/24/2022.    Allergies (verified) Iohexol   History: Past Medical History:  Diagnosis Date   Arthritis    NECK   Diabetes mellitus, type II ([place])    Dysfunctional alcohol use    now quit   GERD (gastroesophageal reflux disease)    Hypertension  Neuropathy    feet   Pancreatitis    Peripheral vascular disease (HCC)    Stress fracture of ankle    Past Surgical History:  Procedure Laterality Date   ABDOMINAL AORTOGRAM W/LOWER EXTREMITY Bilateral 10/28/2019   Procedure: ABDOMINAL AORTOGRAM W/LOWER EXTREMITY;  Surgeon: Wellington Hampshire, MD;  Location: [facility];  Service: Cardiovascular;  Laterality: Bilateral;   ABDOMINAL AORTOGRAM W/LOWER EXTREMITY Bilateral 06/29/2020   Procedure: ABDOMINAL AORTOGRAM W/LOWER EXTREMITY;  Surgeon: Wellington Hampshire, MD;  Location: [facility];  Service: Cardiovascular;  Laterality: Bilateral;   CATARACT EXTRACTION W/PHACO Left 09/03/2019   Procedure: CATARACT EXTRACTION PHACO AND INTRAOCULAR LENS  PLACEMENT (IOC) LEFT VISION BLUE 9.84 00:54.9 17.9%;  Surgeon: Marchia Meiers, MD;  Location: [facility];  Service: Ophthalmology;  Laterality: Left;  Diabetic - diet controlled   LOWER EXTREMITY ANGIOGRAM Bilateral 02/24/2014   Procedure: LOWER EXTREMITY ANGIOGRAM;  Surgeon: Wellington Hampshire, MD;  Location: [facility];  Service: Cardiovascular;  Laterality: Bilateral;   PERIPHERAL VASCULAR CATHETERIZATION     PERIPHERAL VASCULAR INTERVENTION  10/28/2019   Procedure: PERIPHERAL VASCULAR INTERVENTION;  Surgeon: Wellington Hampshire, MD;  Location: [facility];  Service: Cardiovascular;;   PERIPHERAL VASCULAR INTERVENTION  06/29/2020   Procedure: PERIPHERAL VASCULAR INTERVENTION;  Surgeon: Wellington Hampshire, MD;  Location: [facility];  Service: Cardiovascular;;   UMBILICAL HERNIA REPAIR N/A 03/05/2017   Procedure: HERNIA REPAIR UMBILICAL ADULT;  Surgeon: Leonie Green, MD;  Location: [facility];  Service: General;  Laterality: N/A;   Family History  Problem Relation Age of Onset   Heart attack Father    Social History   Socioeconomic History   Marital status: Married    Spouse name: Not on file   Number of children: 2   Years of education: 12   Highest education level: Not on file  Occupational History   Occupation: self-employed  Tobacco Use   Smoking status: Every Day    Packs/day: 0.50    Years: 40.00    Total pack years: 20.00    Types: Cigarettes   Smokeless tobacco: Never  Vaping Use   Vaping Use: Never used  Substance and Sexual Activity   Alcohol use: Not Currently    Comment: quit around 2012   Drug use: No   Sexual activity: Not on file  Other Topics Concern   Not on file  Social History Narrative   Not on file   Social Determinants of Health   Financial Resource Strain: Not on file  Food Insecurity: Not on file  Transportation Needs: Not on file  Physical Activity: Not on file  Stress: Not on file  Social Connections: Not on file     Tobacco Counseling Ready to quit: Not Answered Counseling given: Not Answered   Clinical Intake:                 Diabetic?Yes Nutrition Risk Assessment:  Has the patient had any N/V/D within the last 2 months?  {YES/NO:21197} Does the patient have any non-healing wounds?  {YES/NO:21197} Has the patient had any unintentional weight loss or weight gain?  {YES/NO:21197}  Diabetes:  Is the patient diabetic?  {YES/NO:21197} If diabetic, was a CBG obtained today?  {YES/NO:21197} Did the patient bring in their glucometer from home?  {YES/NO:21197} How often do you monitor your CBG's? ***.   Financial Strains and Diabetes Management:  Are you having any financial strains with the device, your supplies or your  medication? {YES/NO:21197}.  Does the patient want to be seen by Chronic Care Management for management of their diabetes?  {YES/NO:21197} Would the patient like to be referred to a Nutritionist or for Diabetic Management?  {YES/NO:21197}  Diabetic Exams:  {Diabetic Eye Exam:2101801} {Diabetic Foot Exam:2101802}          Activities of Daily Living     No data to display          Patient Care Team: Susy Frizzle, MD as PCP - General (Family Medicine) Wellington Hampshire, MD as PCP - Cardiology (Cardiology)  Indicate any recent Medical Services you may have received from other than [facility] providers in the past year (date may be approximate).     Assessment:   This is a routine wellness examination for [place].  Hearing/Vision screen No results found.  Dietary issues and exercise activities discussed:     Goals Addressed   None    Depression Screen    05/06/2017    2:20 PM  PHQ 2/9 Scores  PHQ - 2 Score 0    Fall Risk     No data to display          [place]:  Any stairs in or around the home? {YES/NO:21197} If so, are there any without handrails? {YES/NO:21197} Home free of loose throw  rugs in walkways, pet beds, electrical cords, etc? {YES/NO:21197} Adequate lighting in your home to reduce risk of falls? {YES/NO:21197}  ASSISTIVE DEVICES UTILIZED TO PREVENT FALLS:  Life alert? {YES/NO:21197} Use of a cane, walker or w/c? {YES/NO:21197} Grab bars in the bathroom? {YES/NO:21197} Shower chair or bench in shower? {YES/NO:21197} Elevated toilet seat or a handicapped toilet? {YES/NO:21197}  TIMED UP AND GO:  Was the test performed? {YES/NO:21197}.  Length of time to ambulate 10 feet: *** sec.   {Appearance of WVPX:1062694}  Cognitive Function:        Immunizations  There is no immunization history on file for this patient.  {TDAP status:2101805}  {Flu Vaccine status:2101806}  {Pneumococcal vaccine status:2101807}  {Covid-19 vaccine status:2101808}  Qualifies for Shingles Vaccine? {YES/NO:21197}  Zostavax completed {YES/NO:21197}  {Shingrix Completed?:2101804}  Screening Tests Health Maintenance  Topic Date Due   Medicare Annual Wellness (AWV)  Never done   COVID-19 Vaccine (1) Never done   Pneumonia Vaccine 60+ Years old (1 - PCV) Never done   FOOT EXAM  Never done   OPHTHALMOLOGY EXAM  Never done   Diabetic kidney evaluation - Urine ACR  Never done   DTaP/Tdap/Td (1 - Tdap) Never done   Zoster Vaccines- Shingrix (1 of 2) Never done   COLONOSCOPY (Pts 45-15yr Insurance coverage will need to be confirmed)  Never done   Lung Cancer Screening  02/17/2007   INFLUENZA VACCINE  Never done   HEMOGLOBIN A1C  07/21/2022   Diabetic kidney evaluation - eGFR measurement  01/19/2023   Hepatitis C Screening  Completed   HPV VACCINES  Aged Out    Health Maintenance  Health Maintenance Due  Topic Date Due   Medicare Annual Wellness (AWV)  Never done   COVID-19 Vaccine (1) Never done   Pneumonia Vaccine 70 Years old (1 - PCV) Never done   FOOT EXAM  Never done   OPHTHALMOLOGY EXAM  Never done   Diabetic kidney evaluation - Urine ACR  Never done    DTaP/Tdap/Td (1 - Tdap) Never done   Zoster Vaccines- Shingrix (1 of 2) Never done   COLONOSCOPY (Pts 45-464yr  Insurance coverage will need to be confirmed)  Never done   Lung Cancer Screening  02/17/2007   INFLUENZA VACCINE  Never done   HEMOGLOBIN A1C  07/21/2022    {Colorectal cancer screening:2101809}  Lung Cancer Screening: (Low Dose CT Chest recommended if Age 1-80 years, 30 pack-year currently smoking OR have quit w/in 15years.) {DOES NOT does:27190::"does not"} qualify.   Lung Cancer Screening Referral: ***  Additional Screening:  Hepatitis C Screening: does qualify; Completed 11/09/16  Vision Screening: Recommended annual ophthalmology exams for early detection of glaucoma and other disorders of the eye. Is the patient up to date with their annual eye exam?  {YES/NO:21197} Who is the provider or what is the name of the office in which the patient attends annual eye exams? *** If pt is not established with a provider, would they like to be referred to a provider to establish care? {YES/NO:21197}.   Dental Screening: Recommended annual dental exams for proper oral hygiene  Community Resource Referral / Chronic Care Management: CRR required this visit?  {YES/NO:21197}  CCM required this visit?  {YES/NO:21197}     Plan:     I have personally reviewed and noted the following in the patient's chart:   Medical and social history Use of alcohol, tobacco or illicit drugs  Current medications and supplements including opioid prescriptions. {Opioid Prescriptions:[phone]} Functional ability and status Nutritional status Physical activity Advanced directives List of other physicians Hospitalizations, surgeries, and ER visits in previous 12 months Vitals Screenings to include cognitive, depression, and falls Referrals and appointments  In addition, I have reviewed and discussed with patient certain preventive protocols, quality metrics, and best practice  recommendations. A written personalized care plan for preventive services as well as general preventive health recommendations were provided to patient.     Vanetta Mulders, [place]   6/96/2952   Due to this being a virtual visit, the after visit summary with patients personalized plan was offered to patient via mail or my-chart. ***Patient declined at this time./ Patient would like to access on my-chart/ per request, patient was mailed a copy of AVS./ Patient preferred to pick up at office at next visit  Nurse Notes: ***

## 2022-07-24 ENCOUNTER — Ambulatory Visit (INDEPENDENT_AMBULATORY_CARE_PROVIDER_SITE_OTHER): Payer: Medicare HMO

## 2022-07-24 VITALS — Ht 74.0 in | Wt 140.0 lb

## 2022-07-24 DIAGNOSIS — Z0000 Encounter for general adult medical examination without abnormal findings: Secondary | ICD-10-CM

## 2022-08-09 ENCOUNTER — Other Ambulatory Visit: Payer: Self-pay | Admitting: *Deleted

## 2022-08-09 ENCOUNTER — Other Ambulatory Visit: Payer: Self-pay | Admitting: Cardiovascular Disease

## 2022-08-24 ENCOUNTER — Other Ambulatory Visit: Payer: Self-pay | Admitting: Cardiovascular Disease

## 2022-08-26 ENCOUNTER — Other Ambulatory Visit: Payer: Self-pay | Admitting: Cardiovascular Disease

## 2022-10-11 DIAGNOSIS — M7912 Myalgia of auxiliary muscles, head and neck: Secondary | ICD-10-CM | POA: Diagnosis not present

## 2022-10-11 DIAGNOSIS — G2581 Restless legs syndrome: Secondary | ICD-10-CM | POA: Diagnosis not present

## 2022-10-11 DIAGNOSIS — Z760 Encounter for issue of repeat prescription: Secondary | ICD-10-CM | POA: Diagnosis not present

## 2022-10-11 DIAGNOSIS — M7918 Myalgia, other site: Secondary | ICD-10-CM | POA: Diagnosis not present

## 2022-10-11 DIAGNOSIS — Z79899 Other long term (current) drug therapy: Secondary | ICD-10-CM | POA: Diagnosis not present

## 2022-12-29 ENCOUNTER — Other Ambulatory Visit: Payer: Self-pay | Admitting: Family Medicine

## 2022-12-31 NOTE — Telephone Encounter (Signed)
Requested medication (s) are due for refill today:   Not sure  07/24/2022 it's reported he is not taking this.  Requested medication (s) are on the active medication list:   Yes  Future visit scheduled:   No      LOV 01/18/2022   Last ordered: 03/23/2022 #30, 1 refill    Returned for provider review.     Requested Prescriptions  Pending Prescriptions Disp Refills   JANUVIA 100 MG tablet [Pharmacy Med Name: JANUVIA 100MG  TABLETS] 30 tablet 1    Sig: TAKE 1 TABLET(100 MG) BY MOUTH DAILY     Endocrinology:  Diabetes - DPP-4 Inhibitors Failed - 12/29/2022  8:37 AM      Failed - HBA1C is between 0 and 7.9 and within 180 days    Hgb A1c MFr Bld  Date Value Ref Range Status  01/18/2022 12.5 (H) <5.7 % of total Hgb Final    Comment:    For someone without known diabetes, a hemoglobin A1c value of 6.5% or greater indicates that they may have  diabetes and this should be confirmed with a follow-up  test. . For someone with known diabetes, a value <7% indicates  that their diabetes is well controlled and a value  greater than or equal to 7% indicates suboptimal  control. A1c targets should be individualized based on  duration of diabetes, age, comorbid conditions, and  other considerations. . Currently, no consensus exists regarding use of hemoglobin A1c for diagnosis of diabetes for children. .          Failed - Valid encounter within last 6 months    Recent Outpatient Visits           2 years ago Uncontrolled type 2 diabetes mellitus with peripheral circulatory disorder (HCC)   [facility] Donita Brooks, MD   3 years ago Uncontrolled type 2 diabetes mellitus with peripheral circulatory disorder (HCC)   [facility] Pickard, Priscille Heidelberg, MD   5 years ago PAD (peripheral artery disease) (HCC)   [facility] Dorena Bodo, PA-C   6 years ago Diabetes type II with atherosclerosis of arteries of extremities (HCC)   [facility]  [facility] Pickard, Priscille Heidelberg, MD   6 years ago PVD (peripheral vascular disease) (HCC)   [facility] Pickard, Priscille Heidelberg, MD              Passed - Cr in normal range and within 360 days    Creat  Date Value Ref Range Status  01/18/2022 0.76 0.70 - 1.35 mg/dL Final

## 2023-01-05 ENCOUNTER — Other Ambulatory Visit: Payer: Self-pay | Admitting: Family Medicine

## 2023-01-07 NOTE — Telephone Encounter (Signed)
Unable to refill per protocol, courtesy refill already given, OV needed for additional refills.  Requested Prescriptions  Pending Prescriptions Disp Refills   JANUVIA 100 MG tablet [Pharmacy Med Name: JANUVIA 100MG  TABLETS] 30 tablet 0    Sig: TAKE 1 TABLET(100 MG) BY MOUTH DAILY     Endocrinology:  Diabetes - DPP-4 Inhibitors Failed - 01/05/2023  8:05 AM      Failed - HBA1C is between 0 and 7.9 and within 180 days    Hgb A1c MFr Bld  Date Value Ref Range Status  01/18/2022 12.5 (H) <5.7 % of total Hgb Final    Comment:    For someone without known diabetes, a hemoglobin A1c value of 6.5% or greater indicates that they may have  diabetes and this should be confirmed with a follow-up  test. . For someone with known diabetes, a value <7% indicates  that their diabetes is well controlled and a value  greater than or equal to 7% indicates suboptimal  control. A1c targets should be individualized based on  duration of diabetes, age, comorbid conditions, and  other considerations. . Currently, no consensus exists regarding use of hemoglobin A1c for diagnosis of diabetes for children. .          Failed - Valid encounter within last 6 months    Recent Outpatient Visits           2 years ago Uncontrolled type 2 diabetes mellitus with peripheral circulatory disorder (HCC)   [facility] Medicine Donita Brooks, MD   3 years ago Uncontrolled type 2 diabetes mellitus with peripheral circulatory disorder (HCC)   [facility] Pickard, Priscille Heidelberg, MD   5 years ago PAD (peripheral artery disease) (HCC)   [facility] Dorena Bodo, PA-C   6 years ago Diabetes type II with atherosclerosis of arteries of extremities (HCC)   [facility] Pickard, Priscille Heidelberg, MD   6 years ago PVD (peripheral vascular disease) (HCC)   [facility] Medicine Pickard, Priscille Heidelberg, MD              Passed - Cr in normal range and within 360 days     Creat  Date Value Ref Range Status  01/18/2022 0.76 0.70 - 1.35 mg/dL Final

## 2023-01-10 DIAGNOSIS — M7912 Myalgia of auxiliary muscles, head and neck: Secondary | ICD-10-CM | POA: Diagnosis not present

## 2023-01-10 DIAGNOSIS — M7918 Myalgia, other site: Secondary | ICD-10-CM | POA: Diagnosis not present

## 2023-01-10 DIAGNOSIS — Z79899 Other long term (current) drug therapy: Secondary | ICD-10-CM | POA: Diagnosis not present

## 2023-01-10 DIAGNOSIS — G2581 Restless legs syndrome: Secondary | ICD-10-CM | POA: Diagnosis not present

## 2023-01-10 DIAGNOSIS — Z760 Encounter for issue of repeat prescription: Secondary | ICD-10-CM | POA: Diagnosis not present

## 2023-02-06 ENCOUNTER — Other Ambulatory Visit: Payer: Self-pay | Admitting: Family Medicine

## 2023-02-07 NOTE — Telephone Encounter (Signed)
Requested medication (s) are due for refill today: Yes  Requested medication (s) are on the active medication list: Yes  Last refill:  12/31/22  Future visit scheduled: No  Notes to clinic:  Unable to refill per protocol, appointment needed. Unable to refill per protocol due to failed labs, no updated results.      Requested Prescriptions  Pending Prescriptions Disp Refills   JANUVIA 100 MG tablet [Pharmacy Med Name: JANUVIA 100MG  TABLETS] 30 tablet 0    Sig: TAKE 1 TABLET(100 MG) BY MOUTH DAILY     Endocrinology:  Diabetes - DPP-4 Inhibitors Failed - 02/06/2023 10:18 AM      Failed - HBA1C is between 0 and 7.9 and within 180 days    Hgb A1c MFr Bld  Date Value Ref Range Status  01/18/2022 12.5 (H) <5.7 % of total Hgb Final    Comment:    For someone without known diabetes, a hemoglobin A1c value of 6.5% or greater indicates that they may have  diabetes and this should be confirmed with a follow-up  test. . For someone with known diabetes, a value <7% indicates  that their diabetes is well controlled and a value  greater than or equal to 7% indicates suboptimal  control. A1c targets should be individualized based on  duration of diabetes, age, comorbid conditions, and  other considerations. . Currently, no consensus exists regarding use of hemoglobin A1c for diagnosis of diabetes for children. .          Failed - Cr in normal range and within 360 days    Creat  Date Value Ref Range Status  01/18/2022 0.76 0.70 - 1.35 mg/dL Final         Failed - Valid encounter within last 6 months    Recent Outpatient Visits           2 years ago Uncontrolled type 2 diabetes mellitus with peripheral circulatory disorder (HCC)   [facility] Donita Brooks, MD   3 years ago Uncontrolled type 2 diabetes mellitus with peripheral circulatory disorder (HCC)   [facility] Donita Brooks, MD   5 years ago PAD (peripheral artery disease) (HCC)    [facility] Dorena Bodo, PA-C   6 years ago Diabetes type II with atherosclerosis of arteries of extremities (HCC)   [facility] Pickard, Priscille Heidelberg, MD   6 years ago PVD (peripheral vascular disease) (HCC)   [facility] Pickard, Priscille Heidelberg, MD

## 2023-02-14 NOTE — Telephone Encounter (Signed)
Pharmacy sent 2nd refill request for  sitaGLIPtin (JANUVIA) 100 MG tablet   LOV: 01/18/2022  Pharmacy:  [id number] - [place], [place] - [address], [place] [address] Phone: [phone]  Fax: [phone] DEA #: [id number]   Please advise pharmacist.  Please see previous messages in thread.

## 2023-02-17 ENCOUNTER — Other Ambulatory Visit: Payer: Self-pay | Admitting: Family Medicine

## 2023-02-19 NOTE — Telephone Encounter (Signed)
Requested medication (s) are due for refill today: yes  Requested medication (s) are on the active medication list: yes  Last refill:  12/31/22  Future visit scheduled: no  Notes to clinic:  Unable to refill per protocol, courtesy refill already given, routing for provider approval.      Requested Prescriptions  Pending Prescriptions Disp Refills   JANUVIA 100 MG tablet [Pharmacy Med Name: JANUVIA 100MG  TABLETS] 30 tablet 0    Sig: TAKE 1 TABLET(100 MG) BY MOUTH DAILY     Endocrinology:  Diabetes - DPP-4 Inhibitors Failed - 02/17/2023 11:13 AM      Failed - HBA1C is between 0 and 7.9 and within 180 days    Hgb A1c MFr Bld  Date Value Ref Range Status  01/18/2022 12.5 (H) <5.7 % of total Hgb Final    Comment:    For someone without known diabetes, a hemoglobin A1c value of 6.5% or greater indicates that they may have  diabetes and this should be confirmed with a follow-up  test. . For someone with known diabetes, a value <7% indicates  that their diabetes is well controlled and a value  greater than or equal to 7% indicates suboptimal  control. A1c targets should be individualized based on  duration of diabetes, age, comorbid conditions, and  other considerations. . Currently, no consensus exists regarding use of hemoglobin A1c for diagnosis of diabetes for children. .          Failed - Cr in normal range and within 360 days    Creat  Date Value Ref Range Status  01/18/2022 0.76 0.70 - 1.35 mg/dL Final         Failed - Valid encounter within last 6 months    Recent Outpatient Visits           2 years ago Uncontrolled type 2 diabetes mellitus with peripheral circulatory disorder (HCC)   [facility] Medicine Donita Brooks, MD   3 years ago Uncontrolled type 2 diabetes mellitus with peripheral circulatory disorder (HCC)   [facility] Donita Brooks, MD   5 years ago PAD (peripheral artery disease) (HCC)   [facility]  Dorena Bodo, PA-C   6 years ago Diabetes type II with atherosclerosis of arteries of extremities (HCC)   [facility] Pickard, Priscille Heidelberg, MD   6 years ago PVD (peripheral vascular disease) (HCC)   [facility] Pickard, Priscille Heidelberg, MD

## 2023-02-22 ENCOUNTER — Ambulatory Visit: Payer: Medicare HMO | Attending: Cardiovascular Disease

## 2023-02-22 ENCOUNTER — Ambulatory Visit (INDEPENDENT_AMBULATORY_CARE_PROVIDER_SITE_OTHER): Payer: Medicare HMO

## 2023-02-22 DIAGNOSIS — I739 Peripheral vascular disease, unspecified: Secondary | ICD-10-CM

## 2023-02-22 DIAGNOSIS — Z95820 Peripheral vascular angioplasty status with implants and grafts: Secondary | ICD-10-CM

## 2023-02-22 LAB — VAS US ABI WITH/WO TBI
Left ABI: 0.67
Right ABI: 0.53

## 2023-02-27 ENCOUNTER — Other Ambulatory Visit: Payer: Self-pay | Admitting: *Deleted

## 2023-02-27 DIAGNOSIS — I739 Peripheral vascular disease, unspecified: Secondary | ICD-10-CM

## 2023-03-07 DIAGNOSIS — Z961 Presence of intraocular lens: Secondary | ICD-10-CM | POA: Diagnosis not present

## 2023-03-07 DIAGNOSIS — H2511 Age-related nuclear cataract, right eye: Secondary | ICD-10-CM | POA: Diagnosis not present

## 2023-03-07 DIAGNOSIS — E119 Type 2 diabetes mellitus without complications: Secondary | ICD-10-CM | POA: Diagnosis not present

## 2023-03-07 DIAGNOSIS — H25041 Posterior subcapsular polar age-related cataract, right eye: Secondary | ICD-10-CM | POA: Diagnosis not present

## 2023-03-07 DIAGNOSIS — H538 Other visual disturbances: Secondary | ICD-10-CM | POA: Diagnosis not present

## 2023-03-08 ENCOUNTER — Ambulatory Visit: Payer: Self-pay

## 2023-03-08 NOTE — Telephone Encounter (Signed)
     Chief Complaint: Wife reports "high blood sugar at [facility] yesterday. I don't know what it was." Asking for new patient appointment. Stopped taking medications 2 months ago. Symptoms: Thirst, frequent urination. No UC. Frequency: Weeks Pertinent Negatives: Patient denies  Disposition: [] ED /[] Urgent Care (no appt availability in office) / [x] Appointment(In office/virtual)/ []  [place] [facility]/ [] Home Care/ [] Refused Recommended Disposition /[]  Mobile Bus/ []  Follow-up with PCP Additional Notes:   Reason for Disposition  [1] Symptoms of high blood sugar (e.g., abnormally thirsty, frequent urination, weight loss) AND [2] not able to test blood glucose  Answer Assessment - Initial Assessment Questions 1. BLOOD GLUCOSE: "What is your blood glucose level?"      Unsure 2. ONSET: "When did you check the blood glucose?"     Yesterday at [facility] 3. USUAL RANGE: "What is your glucose level usually?" (e.g., usual fasting morning value, usual evening value)     250 4. KETONES: "Do you check for ketones (urine or blood test strips)?" If Yes, ask: "What does the test show now?"      No 5. TYPE 1 or 2:  "Do you know what type of diabetes you have?"  (e.g., Type 1, Type 2, Gestational; doesn't know)      Type 2 6. INSULIN: "Do you take insulin?" "What type of insulin(s) do you use? What is the mode of delivery? (syringe, pen; injection or pump)?"      No 7. DIABETES PILLS: "Do you take any pills for your diabetes?" If Yes, ask: "Have you missed taking any pills recently?"     Stopped taking his medication x 2 months ago 8. OTHER SYMPTOMS: "Do you have any symptoms?" (e.g., fever, frequent urination, difficulty breathing, dizziness, weakness, vomiting)     Thirsty, frequent urination 9. PREGNANCY: "Is there any chance you are pregnant?" "When was your last menstrual period?"     N/a  Protocols used: Diabetes - High Blood Sugar-A-AH

## 2023-03-14 ENCOUNTER — Encounter: Payer: Self-pay | Admitting: Family Medicine

## 2023-03-14 ENCOUNTER — Ambulatory Visit (INDEPENDENT_AMBULATORY_CARE_PROVIDER_SITE_OTHER): Payer: Medicare HMO | Admitting: Family Medicine

## 2023-03-14 ENCOUNTER — Ambulatory Visit: Payer: Self-pay

## 2023-03-14 VITALS — BP 141/78 | HR 84 | Ht 74.0 in | Wt 122.4 lb

## 2023-03-14 DIAGNOSIS — K219 Gastro-esophageal reflux disease without esophagitis: Secondary | ICD-10-CM

## 2023-03-14 DIAGNOSIS — E1169 Type 2 diabetes mellitus with other specified complication: Secondary | ICD-10-CM

## 2023-03-14 DIAGNOSIS — Z7984 Long term (current) use of oral hypoglycemic drugs: Secondary | ICD-10-CM

## 2023-03-14 DIAGNOSIS — Z125 Encounter for screening for malignant neoplasm of prostate: Secondary | ICD-10-CM | POA: Diagnosis not present

## 2023-03-14 DIAGNOSIS — E1159 Type 2 diabetes mellitus with other circulatory complications: Secondary | ICD-10-CM

## 2023-03-14 DIAGNOSIS — F17200 Nicotine dependence, unspecified, uncomplicated: Secondary | ICD-10-CM | POA: Diagnosis not present

## 2023-03-14 DIAGNOSIS — R634 Abnormal weight loss: Secondary | ICD-10-CM

## 2023-03-14 DIAGNOSIS — E785 Hyperlipidemia, unspecified: Secondary | ICD-10-CM | POA: Diagnosis not present

## 2023-03-14 DIAGNOSIS — I739 Peripheral vascular disease, unspecified: Secondary | ICD-10-CM | POA: Diagnosis not present

## 2023-03-14 DIAGNOSIS — K8689 Other specified diseases of pancreas: Secondary | ICD-10-CM | POA: Diagnosis not present

## 2023-03-14 DIAGNOSIS — Z0001 Encounter for general adult medical examination with abnormal findings: Secondary | ICD-10-CM

## 2023-03-14 DIAGNOSIS — E118 Type 2 diabetes mellitus with unspecified complications: Secondary | ICD-10-CM

## 2023-03-14 DIAGNOSIS — Z0000 Encounter for general adult medical examination without abnormal findings: Secondary | ICD-10-CM | POA: Insufficient documentation

## 2023-03-14 DIAGNOSIS — I152 Hypertension secondary to endocrine disorders: Secondary | ICD-10-CM | POA: Insufficient documentation

## 2023-03-14 MED ORDER — MIRTAZAPINE 7.5 MG PO TABS: 7.5000 mg | ORAL_TABLET | Freq: Every day | ORAL | 0 refills | Status: DC

## 2023-03-14 MED ORDER — LISINOPRIL 10 MG PO TABS: 10.0000 mg | ORAL_TABLET | Freq: Every day | ORAL | 3 refills | Status: DC

## 2023-03-14 NOTE — Assessment & Plan Note (Signed)
Chronic, stable Continues on plavix 75 mg Declines smoking cessation efforts

## 2023-03-14 NOTE — Telephone Encounter (Signed)
Chief Complaint: How to take Zenpep  Disposition: [] ED /[] Urgent Care (no appt availability in office) / [] Appointment(In office/virtual)/ []  [facility]/ [] Home Care/ [] Refused Recommended Disposition /[] [facility]/ [x]  Follow-up with PCP Additional Notes: Patient's wife [name], per DPR, called and says they were given samples for him to try of Zenpep and she forgot how many to give him. She remembers Robynn Pane says to give it with the largest meal, but needs to know how many. Advised I will send this to Robynn Pane and someone will call her back tomorrow. She says if she doesn't answer the line, leave a detailed message. She says the patient could also be called, but it's best to call her. CB# [phone]    Summary: Instructions needed on  how to take medication   Directions not on the box,, Directions are needed on how to medication Zenpep     Reason for Disposition . [1] Caller has NON-URGENT medicine question about med that PCP prescribed AND [2] triager unable to answer question  Answer Assessment - Initial Assessment Questions 1. NAME of MEDICINE: "What medicine(s) are you calling about?"     Zenpep 2. QUESTION: "What is your question?" (e.g., double dose of medicine, side effect)     How to take it, not sure what Robynn Pane said 3. PRESCRIBER: "Who prescribed the medicine?" Reason: if prescribed by specialist, call should be referred to that group.     Merita Norton gave samples, so no prescription  Protocols used: Medication Question Call-A-AH

## 2023-03-14 NOTE — Assessment & Plan Note (Signed)
Acute on chronic, samples of enzymes provided while we wait on enzyme lab work

## 2023-03-14 NOTE — Assessment & Plan Note (Signed)
Chronic, The 10-year ASCVD risk score (Arnett DK, et al., 2019) is: 46.1% Repeat LP LDL goal <70 Previously on crestor 10

## 2023-03-14 NOTE — Assessment & Plan Note (Signed)
Chronic, stable Reports not using daily PPI at this time given recent weight loss Continue to monitor

## 2023-03-14 NOTE — Assessment & Plan Note (Signed)

## 2023-03-14 NOTE — Assessment & Plan Note (Signed)
Denies LUTS; recommend PSA in place of DRE. If PSA is elevated for age, we will repeat; if PSA remains elevated pt will be referred to urology for DRE and next steps for best treatment.

## 2023-03-14 NOTE — Assessment & Plan Note (Signed)
Acute; worsening x6 months Reports worsening since unable to obtain DM meds Repeat labs Recommend referrals to assist

## 2023-03-14 NOTE — Assessment & Plan Note (Signed)
Chronic, uncontrolled With reports of neuropathy Repeat A1c and urine micro Continue to recommend balanced, lower carb meals. Smaller meal size, adding snacks. Choosing water as drink of choice and increasing purposeful exercise.  Previously on glipizide 10 mg daily, januvuia 100 mg daily and historically on metformin

## 2023-03-14 NOTE — Progress Notes (Signed)
New patient visit   [name]   DOB: [birth year]   72 y.o. Male  MRN: [id number] Visit Date: 03/14/2023  Today's healthcare provider: Jacky Kindle, FNP  Patient presents for new patient visit to establish care.  Introduced to Publishing rights manager role and practice setting.  All questions answered.  Discussed provider/patient relationship and expectations.   Chief Complaint  Patient presents with   Establish Care    Patient has cataract on the right eye, diabetic, has been having pancreatic pain, Has been taking glipizide and januvia, has not had medication for 3 weeks    Subjective    Oscar Mills is a 72 y.o. male who presents today as a new patient to establish care.  HPI HPI     Establish Care    Additional comments: Patient has cataract on the right eye, diabetic, has been having pancreatic pain, Has been taking glipizide and januvia, has not had medication for 3 weeks       Last edited by Rolly Salter, CMA on 03/14/2023  2:03 PM.      Past Medical History:  Diagnosis Date   Arthritis    NECK   Diabetes mellitus, type II (HCC)    Dysfunctional alcohol use    now quit   GERD (gastroesophageal reflux disease)    Hypertension    Neuropathy    feet   Pancreatitis    Peripheral vascular disease (HCC)    Stress fracture of ankle    Past Surgical History:  Procedure Laterality Date   ABDOMINAL AORTOGRAM W/LOWER EXTREMITY Bilateral 10/28/2019   Procedure: ABDOMINAL AORTOGRAM W/LOWER EXTREMITY;  Surgeon: Iran Ouch, MD;  Location: [facility];  Service: Cardiovascular;  Laterality: Bilateral;   ABDOMINAL AORTOGRAM W/LOWER EXTREMITY Bilateral 06/29/2020   Procedure: ABDOMINAL AORTOGRAM W/LOWER EXTREMITY;  Surgeon: Iran Ouch, MD;  Location: [facility];  Service: Cardiovascular;  Laterality: Bilateral;   CATARACT EXTRACTION W/PHACO Left 09/03/2019   Procedure: CATARACT EXTRACTION PHACO AND INTRAOCULAR LENS PLACEMENT  (IOC) LEFT VISION BLUE 9.84 00:54.9 17.9%;  Surgeon: Elliot Cousin, MD;  Location: [facility];  Service: Ophthalmology;  Laterality: Left;  Diabetic - diet controlled   LOWER EXTREMITY ANGIOGRAM Bilateral 02/24/2014   Procedure: LOWER EXTREMITY ANGIOGRAM;  Surgeon: Iran Ouch, MD;  Location: [facility];  Service: Cardiovascular;  Laterality: Bilateral;   PERIPHERAL VASCULAR CATHETERIZATION     PERIPHERAL VASCULAR INTERVENTION  10/28/2019   Procedure: PERIPHERAL VASCULAR INTERVENTION;  Surgeon: Iran Ouch, MD;  Location: [facility];  Service: Cardiovascular;;   PERIPHERAL VASCULAR INTERVENTION  06/29/2020   Procedure: PERIPHERAL VASCULAR INTERVENTION;  Surgeon: Iran Ouch, MD;  Location: [facility];  Service: Cardiovascular;;   UMBILICAL HERNIA REPAIR N/A 03/05/2017   Procedure: HERNIA REPAIR UMBILICAL ADULT;  Surgeon: Nadeen Landau, MD;  Location: [facility];  Service: General;  Laterality: N/A;   Family Status  Relation Name Status   Mother  Deceased   Father  Deceased   Sister  Alive  No partnership data on file   Family History  Problem Relation Age of Onset   Heart attack Father    Social History   Socioeconomic History   Marital status: Married    Spouse name: Not on file   Number of children: 2   Years of education: 12   Highest education level: 11th grade  Occupational History   Occupation: self-employed  Tobacco Use  Smoking status: Every Day    Current packs/day: 0.50    Average packs/day: 0.5 packs/day for 40.0 years (20.0 ttl pk-yrs)    Types: Cigarettes   Smokeless tobacco: Never  Vaping Use   Vaping status: Never Used  Substance and Sexual Activity   Alcohol use: Not Currently    Comment: quit around 2012   Drug use: No   Sexual activity: Not on file  Other Topics Concern   Not on file  Social History Narrative   Not on file   Social Determinants of Health   Financial Resource Strain: Low Risk  (03/13/2023)    Overall Financial Resource Strain (CARDIA)    Difficulty of Paying Living Expenses: Not very hard  Food Insecurity: No Food Insecurity (03/13/2023)   Hunger Vital Sign    Worried About Running Out of Food in the Last Year: Never true    Ran Out of Food in the Last Year: Never true  Transportation Needs: No Transportation Needs (03/13/2023)   PRAPARE - Administrator, Civil Service (Medical): No    Lack of Transportation (Non-Medical): No  Physical Activity: Sufficiently Active (03/13/2023)   Exercise Vital Sign    Days of Exercise per Week: 5 days    Minutes of Exercise per Session: 90 min  Stress: Stress Concern Present (03/13/2023)   [employer] of Occupational Health - Occupational Stress Questionnaire    Feeling of Stress : To some extent  Social Connections: Moderately Isolated (03/13/2023)   Social Connection and Isolation Panel [NHANES]    Frequency of Communication with Friends and Family: Twice a week    Frequency of Social Gatherings with Friends and Family: Once a week    Attends Religious Services: Never    Database administrator or Organizations: No    Attends Banker Meetings: Never    Marital Status: Married   Outpatient Medications Prior to Visit  Medication Sig   acetaminophen (TYLENOL) 500 MG tablet Take 1,000 mg by mouth every 6 (six) hours as needed for moderate pain.   amLODipine (NORVASC) 10 MG tablet TAKE 1 TABLET(10 MG) BY MOUTH DAILY   clopidogrel (PLAVIX) 75 MG tablet Take 1 tablet (75 mg total) by mouth daily.   gabapentin (NEURONTIN) 300 MG capsule Take 300 mg by mouth in the morning and at bedtime.   rosuvastatin (CRESTOR) 10 MG tablet TAKE 1 TABLET(10 MG) BY MOUTH DAILY   aspirin 81 MG tablet Take 81 mg by mouth daily. (Patient not taking: Reported on 03/14/2023)   glipiZIDE (GLUCOTROL XL) 10 MG 24 hr tablet TAKE 1 TABLET(10 MG) BY MOUTH DAILY WITH BREAKFAST (Patient not taking: Reported on 07/24/2022)   pantoprazole  (PROTONIX) 40 MG tablet Take 1 tablet (40 mg total) by mouth daily. (Patient not taking: Reported on 03/14/2023)   sitaGLIPtin (JANUVIA) 100 MG tablet TAKE 1 TABLET(100 MG) BY MOUTH DAILY (Patient not taking: Reported on 03/14/2023)   [DISCONTINUED] amoxicillin (AMOXIL) 875 MG tablet SMARTSIG:1 Tablet(s) By Mouth (Patient not taking: Reported on 03/14/2023)   [DISCONTINUED] amoxicillin-clavulanate (AUGMENTIN) 875-125 MG tablet Take 1 tablet by mouth 2 (two) times daily. (Patient not taking: Reported on 03/14/2023)   [DISCONTINUED] Blood Glucose Monitoring Suppl (ACCU-CHEK AVIVA PLUS) w/Device KIT Check BS bid (Patient not taking: Reported on 03/14/2023)   [DISCONTINUED] glucose blood (ACCU-CHEK AVIVA PLUS) test strip Check BS BID - new onset dm E11.9 (Patient not taking: Reported on 07/24/2022)   [DISCONTINUED] HYDROcodone-acetaminophen (NORCO) 7.5-325 MG tablet Take 1 tablet by  mouth 3 (three) times daily as needed. (Patient not taking: Reported on 03/14/2023)   [DISCONTINUED] Lancets (ACCU-CHEK MULTICLIX) lancets Check BS bid - New onset DM E11.9 (Patient not taking: Reported on 07/24/2022)   [DISCONTINUED] lipase/protease/amylase (CREON) 36000 UNITS CPEP capsule Take 2 capsules (72,000 Units total) by mouth 3 (three) times daily with meals. May also take 1 capsule (36,000 Units total) as needed (with snacks). (Patient not taking: Reported on 03/14/2023)   [DISCONTINUED] metFORMIN (GLUCOPHAGE) 500 MG tablet TAKE 2 TABLETS(1000 MG) BY MOUTH TWICE DAILY WITH A MEAL (Patient not taking: Reported on 07/24/2022)   [DISCONTINUED] ondansetron (ZOFRAN) 4 MG tablet Take 1 tablet (4 mg total) by mouth every 8 (eight) hours as needed for nausea or vomiting. (Patient not taking: Reported on 03/14/2023)   [DISCONTINUED] tiZANidine (ZANAFLEX) 4 MG tablet  (Patient not taking: Reported on 03/14/2023)   No facility-administered medications prior to visit.   Allergies  Allergen Reactions   Iohexol      Code: HIVES, Desc:  HIVES WITH OMNIPAQUE 300 OBSERVED BY DR Gery Pray NO MEDS GIVEN      There is no immunization history on file for this patient.  Health Maintenance  Topic Date Due   COVID-19 Vaccine (1) Never done   Pneumonia Vaccine 73+ Years old (1 of 2 - PCV) Never done   FOOT EXAM  Never done   OPHTHALMOLOGY EXAM  Never done   Diabetic kidney evaluation - Urine ACR  Never done   DTaP/Tdap/Td (1 - Tdap) Never done   Zoster Vaccines- Shingrix (1 of 2) Never done   Colonoscopy  Never done   Lung Cancer Screening  02/17/2007   HEMOGLOBIN A1C  07/21/2022   Diabetic kidney evaluation - eGFR measurement  01/19/2023   INFLUENZA VACCINE  09/23/2023 (Originally 01/24/2023)   Medicare Annual Wellness (AWV)  07/25/2023   Hepatitis C Screening  Completed   HPV VACCINES  Aged Out    Patient Care Team: Jacky Kindle, FNP as PCP - General (Family Medicine) Iran Ouch, MD as PCP - Cardiology (Cardiology)    Objective    BP (!) 141/78 (BP Location: Left Arm, Patient Position: Sitting, Cuff Size: Large)   Pulse 84   Ht 6\' 2"  (1.88 m)   Wt 122 lb 6.4 oz (55.5 kg)   SpO2 100%   BMI 15.72 kg/m   Physical Exam Vitals and nursing note reviewed.  Constitutional:      General: He is awake. He is not in acute distress.    Appearance: Normal appearance. He is well-developed and well-groomed. He is cachectic. He is ill-appearing. He is not toxic-appearing or diaphoretic.  HENT:     Head: Normocephalic and atraumatic.     Jaw: There is normal jaw occlusion. No trismus, tenderness, swelling or pain on movement.     Salivary Glands: Right salivary gland is not diffusely enlarged or tender. Left salivary gland is not diffusely enlarged or tender.     Right Ear: Hearing, tympanic membrane, ear canal and external ear normal. There is no impacted cerumen.     Left Ear: Hearing, tympanic membrane, ear canal and external ear normal. There is no impacted cerumen.     Nose: Nose normal. No congestion or  rhinorrhea.     Right Turbinates: Not enlarged, swollen or pale.     Left Turbinates: Not enlarged, swollen or pale.     Right Sinus: No maxillary sinus tenderness or frontal sinus tenderness.     Left Sinus: No maxillary  sinus tenderness or frontal sinus tenderness.     Mouth/Throat:     Lips: Pink.     Mouth: Mucous membranes are moist. No injury, lacerations, oral lesions or angioedema.     Pharynx: Oropharynx is clear. Uvula midline. No pharyngeal swelling, oropharyngeal exudate or posterior oropharyngeal erythema.     Tonsils: No tonsillar exudate or tonsillar abscesses.  Eyes:     General: Lids are normal. Vision grossly intact. Gaze aligned appropriately.        Right eye: No discharge.        Left eye: No discharge.     Extraocular Movements: Extraocular movements intact.     Conjunctiva/sclera: Conjunctivae normal.     Pupils: Pupils are equal, round, and reactive to light.  Neck:     Thyroid: No thyroid mass, thyromegaly or thyroid tenderness.     Vascular: No carotid bruit.     Trachea: Trachea normal. No tracheal tenderness.  Cardiovascular:     Rate and Rhythm: Normal rate and regular rhythm.     Pulses: Normal pulses.          Carotid pulses are 2+ on the right side and 2+ on the left side.      Radial pulses are 2+ on the right side and 2+ on the left side.       Femoral pulses are 2+ on the right side and 2+ on the left side.      Popliteal pulses are 2+ on the right side and 2+ on the left side.       Dorsalis pedis pulses are 2+ on the right side and 2+ on the left side.       Posterior tibial pulses are 2+ on the right side and 2+ on the left side.     Heart sounds: Normal heart sounds, S1 normal and S2 normal. No murmur heard.    No friction rub. No gallop.  Pulmonary:     Effort: Pulmonary effort is normal. No respiratory distress.     Breath sounds: Normal breath sounds and air entry. No stridor. No wheezing, rhonchi or rales.  Chest:     Chest wall: No  tenderness.  Abdominal:     General: Abdomen is flat. Bowel sounds are normal. There is no distension.     Palpations: Abdomen is soft. There is no mass.     Tenderness: There is no abdominal tenderness. There is no guarding or rebound.     Hernia: No hernia is present.  Genitourinary:    Comments: Exam deferred; denies complaints Musculoskeletal:        General: No swelling, tenderness, deformity or signs of injury. Normal range of motion.     Cervical back: Normal range of motion and neck supple. No rigidity or tenderness.     Right lower leg: No edema.     Left lower leg: No edema.  Lymphadenopathy:     Cervical: No cervical adenopathy.     Right cervical: No superficial, deep or posterior cervical adenopathy.    Left cervical: No superficial, deep or posterior cervical adenopathy.  Skin:    General: Skin is warm and dry.     Capillary Refill: Capillary refill takes less than 2 seconds.     Coloration: Skin is not jaundiced or pale.     Findings: No bruising, erythema, lesion or rash.  Neurological:     General: No focal deficit present.     Mental Status: He is alert and oriented to  person, place, and time. Mental status is at baseline.     GCS: GCS eye subscore is 4. GCS verbal subscore is 5. GCS motor subscore is 6.     Sensory: Sensation is intact. No sensory deficit.     Motor: Motor function is intact. No weakness.     Coordination: Coordination is intact.     Gait: Gait is intact.  Psychiatric:        Attention and Perception: Attention and perception normal.        Mood and Affect: Mood and affect normal.        Speech: Speech normal.        Behavior: Behavior normal. Behavior is cooperative.        Thought Content: Thought content normal.        Cognition and Memory: Cognition normal.        Judgment: Judgment normal.     Depression Screen    03/14/2023    2:09 PM 07/24/2022    1:28 PM 05/06/2017    2:20 PM  PHQ 2/9 Scores  PHQ - 2 Score 3 0 0  PHQ- 9 Score  13     No results found for any visits on 03/14/23.  Assessment & Plan      Problem List Items Addressed This Visit       Cardiovascular and Mediastinum   Hypertension associated with diabetes (HCC)    Chronic, elevated Goal 129/79 Previously on norvasc 10 mg Recommend addition of 10 mg lisinopril as well      Relevant Medications   lisinopril (ZESTRIL) 10 MG tablet   Other Relevant Orders   CBC with Differential/Platelet   Comprehensive Metabolic Panel (CMET)   TSH   Hemoglobin A1c   Urine Microalbumin w/creat. ratio   PAD (peripheral artery disease) (HCC)    Chronic, stable Continues on plavix 75 mg Declines smoking cessation efforts      Relevant Medications   lisinopril (ZESTRIL) 10 MG tablet     Digestive   Gastroesophageal reflux disease without esophagitis    Chronic, stable Reports not using daily PPI at this time given recent weight loss Continue to monitor      Pancreatic insufficiency    Acute on chronic, samples of enzymes provided while we wait on enzyme lab work      Relevant Orders   Lipase   Amylase     Endocrine   Hyperlipidemia due to type 2 diabetes mellitus (HCC)    Chronic, The 10-year ASCVD risk score (Arnett DK, et al., 2019) is: 46.1% Repeat LP LDL goal <70 Previously on crestor 10      Relevant Medications   lisinopril (ZESTRIL) 10 MG tablet   Other Relevant Orders   Lipid panel   Type 2 diabetes mellitus with complication, without long-term current use of insulin (HCC)    Chronic, uncontrolled With reports of neuropathy Repeat A1c and urine micro Continue to recommend balanced, lower carb meals. Smaller meal size, adding snacks. Choosing water as drink of choice and increasing purposeful exercise.  Previously on glipizide 10 mg daily, januvuia 100 mg daily and historically on metformin      Relevant Medications   lisinopril (ZESTRIL) 10 MG tablet     Other   Annual physical exam - Primary    Things to do to keep  yourself healthy  - Exercise at least 30-45 minutes a day, 3-4 days a week.  - Eat a low-fat diet with lots of fruits and vegetables,  up to 7-9 servings per day.  - Seatbelts can save your life. Wear them always.  - Smoke detectors on every level of your home, check batteries every year.  - Eye Doctor - have an eye exam every 1-2 years  - Safe sex - if you may be exposed to STDs, use a condom.  - Alcohol -  If you drink, do it moderately, less than 2 drinks per day.  - Health Care Power of Attorney. Choose someone to speak for you if you are not able.  - Depression is common in our stressful world.If you're feeling down or losing interest in things you normally enjoy, please come in for a visit.  - Violence - If anyone is threatening or hurting you, please call immediately.       Screening PSA (prostate specific antigen)    Denies LUTS; recommend PSA in place of DRE. If PSA is elevated for age, we will repeat; if PSA remains elevated pt will be referred to urology for DRE and next steps for best treatment.       Relevant Orders   PSA   Smoker    Chronic, remains precontemplative at this time Continue to monitor      Relevant Orders   Ambulatory Referral Lung Cancer Screening [facility]   Weight loss    Acute; worsening x6 months Reports worsening since unable to obtain DM meds Repeat labs Recommend referrals to assist      Relevant Medications   mirtazapine (REMERON) 7.5 MG tablet   Other Relevant Orders   Ambulatory Referral Lung Cancer Screening [facility]   Ambulatory referral to Hematology / Oncology   Ambulatory referral to Gastroenterology   No follow-ups on file.   Medication Samples have been provided to the patient.  Drug name: Zenpep; 4 bores; see book; 2 capsules with largest meal  The patient has been instructed regarding the correct time, dose, and frequency of taking this medication, including desired effects and most common side effects.    Jacky Kindle 9:22 PM 03/14/2023    I, Jacky Kindle, FNP, have reviewed all documentation for this visit. The documentation on 03/14/23 for the exam, diagnosis, procedures, and orders are all accurate and complete.  Jacky Kindle, FNP  [facility] [phone] (phone) [phone] (fax)  [facility]

## 2023-03-14 NOTE — Assessment & Plan Note (Signed)
Chronic, elevated Goal 129/79 Previously on norvasc 10 mg Recommend addition of 10 mg lisinopril as well

## 2023-03-14 NOTE — Assessment & Plan Note (Signed)
Chronic, remains precontemplative at this time Continue to monitor

## 2023-03-15 ENCOUNTER — Other Ambulatory Visit: Payer: Self-pay | Admitting: Family Medicine

## 2023-03-15 DIAGNOSIS — E1165 Type 2 diabetes mellitus with hyperglycemia: Secondary | ICD-10-CM

## 2023-03-15 LAB — COMPREHENSIVE METABOLIC PANEL
ALT: 33 IU/L (ref 0–44)
AST: 10 IU/L (ref 0–40)
Albumin: 4.1 g/dL (ref 3.9–4.9)
Alkaline Phosphatase: 120 IU/L (ref 44–121)
BUN/Creatinine Ratio: 22 (ref 10–24)
BUN: 18 mg/dL (ref 8–27)
Bilirubin Total: 0.3 mg/dL (ref 0.0–1.2)
CO2: 18 mmol/L — ABNORMAL LOW (ref 20–29)
Calcium: 10.4 mg/dL — ABNORMAL HIGH (ref 8.6–10.2)
Chloride: 94 mmol/L — ABNORMAL LOW (ref 96–106)
Creatinine, Ser: 0.81 mg/dL (ref 0.76–1.27)
Globulin, Total: 2.4 g/dL (ref 1.5–4.5)
Glucose: 440 mg/dL — ABNORMAL HIGH (ref 70–99)
Potassium: 5.4 mmol/L — ABNORMAL HIGH (ref 3.5–5.2)
Sodium: 131 mmol/L — ABNORMAL LOW (ref 134–144)
Total Protein: 6.5 g/dL (ref 6.0–8.5)
eGFR: 95 mL/min/{1.73_m2} (ref >59)

## 2023-03-15 LAB — HEMOGLOBIN A1C
Est. average glucose Bld gHb Est-mCnc: >398 mg/dL
Hgb A1c MFr Bld: >15.5 % — ABNORMAL HIGH (ref 4.8–5.6)

## 2023-03-15 LAB — CBC WITH DIFFERENTIAL/PLATELET
Basophils Absolute: 0 10*3/uL (ref 0.0–0.2)
Basos: 0 %
EOS (ABSOLUTE): 0 10*3/uL (ref 0.0–0.4)
Eos: 0 %
Hematocrit: 47.2 % (ref 37.5–51.0)
Hemoglobin: 15.5 g/dL (ref 13.0–17.7)
Immature Grans (Abs): 0.2 10*3/uL — ABNORMAL HIGH (ref 0.0–0.1)
Immature Granulocytes: 1 %
Lymphocytes Absolute: 2.2 10*3/uL (ref 0.7–3.1)
Lymphs: 16 %
MCH: 31.6 pg (ref 26.6–33.0)
MCHC: 32.8 g/dL (ref 31.5–35.7)
MCV: 96 fL (ref 79–97)
Monocytes Absolute: 0.8 10*3/uL (ref 0.1–0.9)
Monocytes: 6 %
Neutrophils Absolute: 10.8 10*3/uL — ABNORMAL HIGH (ref 1.4–7.0)
Neutrophils: 77 %
Platelets: 272 10*3/uL (ref 150–450)
RBC: 4.91 x10E6/uL (ref 4.14–5.80)
RDW: 11.9 % (ref 11.6–15.4)
WBC: 14 10*3/uL — ABNORMAL HIGH (ref 3.4–10.8)

## 2023-03-15 LAB — MICROALBUMIN / CREATININE URINE RATIO
Creatinine, Urine: 39.5 mg/dL
Microalb/Creat Ratio: 318 mg/g creat — ABNORMAL HIGH (ref 0–29)
Microalbumin, Urine: 125.5 ug/mL

## 2023-03-15 LAB — LIPID PANEL
Chol/HDL Ratio: 2.4 ratio (ref 0.0–5.0)
Cholesterol, Total: 106 mg/dL (ref 100–199)
HDL: 45 mg/dL (ref >39)
LDL Chol Calc (NIH): 37 mg/dL (ref 0–99)
Triglycerides: 138 mg/dL (ref 0–149)
VLDL Cholesterol Cal: 24 mg/dL (ref 5–40)

## 2023-03-15 LAB — PSA: Prostate Specific Ag, Serum: 0.4 ng/mL (ref 0.0–4.0)

## 2023-03-15 LAB — TSH: TSH: 1.43 u[IU]/mL (ref 0.450–4.500)

## 2023-03-15 LAB — LIPASE: Lipase: 56 U/L (ref 13–78)

## 2023-03-15 LAB — AMYLASE: Amylase: 34 U/L (ref 31–110)

## 2023-03-15 MED ORDER — BLOOD GLUCOSE MONITORING SUPPL DEVI: 1.0000 | Freq: Two times a day (BID) | 0 refills | Status: DC

## 2023-03-15 MED ORDER — LANCETS MISC. MISC
1.0000 | Freq: Two times a day (BID) | 0 refills | Status: AC
Start: 2023-03-15 — End: 2023-04-14

## 2023-03-15 MED ORDER — BLOOD GLUCOSE TEST VI STRP
1.0000 | ORAL_STRIP | Freq: Two times a day (BID) | 0 refills | Status: AC
Start: 2023-03-15 — End: 2023-05-04

## 2023-03-15 MED ORDER — LANCET DEVICE MISC
1.0000 | Freq: Two times a day (BID) | 0 refills | Status: AC
Start: 2023-03-15 — End: 2023-04-14

## 2023-03-15 MED ORDER — PEN NEEDLES 31G X 5 MM MISC: 1.0000 | Freq: Every evening | 0 refills | Status: DC

## 2023-03-15 MED ORDER — LANTUS SOLOSTAR 100 UNIT/ML ~~LOC~~ SOPN
20.0000 [IU] | PEN_INJECTOR | Freq: Every day | SUBCUTANEOUS | 0 refills | Status: DC
Start: 2023-03-15 — End: 2023-03-20

## 2023-03-15 NOTE — Telephone Encounter (Signed)
Patient advised. Verbalized understanding

## 2023-03-16 ENCOUNTER — Other Ambulatory Visit: Payer: Self-pay

## 2023-03-16 ENCOUNTER — Emergency Department
Admission: EM | Admit: 2023-03-16 | Discharge: 2023-03-16 | Disposition: A | Discharge status: Home / Self Care | Payer: Medicare HMO | Attending: Emergency Medicine | Admitting: Emergency Medicine

## 2023-03-16 DIAGNOSIS — R739 Hyperglycemia, unspecified: Secondary | ICD-10-CM | POA: Diagnosis not present

## 2023-03-16 DIAGNOSIS — R799 Abnormal finding of blood chemistry, unspecified: Secondary | ICD-10-CM | POA: Diagnosis not present

## 2023-03-16 DIAGNOSIS — E1165 Type 2 diabetes mellitus with hyperglycemia: Secondary | ICD-10-CM | POA: Diagnosis not present

## 2023-03-16 LAB — BASIC METABOLIC PANEL
Anion gap: 14 (ref 5–15)
Anion gap: 12 (ref 5–15)
BUN: 16 mg/dL (ref 8–23)
BUN: 14 mg/dL (ref 8–23)
CO2: 18 mmol/L — ABNORMAL LOW (ref 22–32)
CO2: 16 mmol/L — ABNORMAL LOW (ref 22–32)
Calcium: 10.1 mg/dL (ref 8.9–10.3)
Calcium: 9 mg/dL (ref 8.9–10.3)
Chloride: 97 mmol/L — ABNORMAL LOW (ref 98–111)
Chloride: 104 mmol/L (ref 98–111)
Creatinine, Ser: 0.81 mg/dL (ref 0.61–1.24)
Creatinine, Ser: 0.71 mg/dL (ref 0.61–1.24)
GFR, Estimated: >60 mL/min (ref >60)
GFR, Estimated: >60 mL/min (ref >60)
Glucose, Bld: 327 mg/dL — ABNORMAL HIGH (ref 70–99)
Glucose, Bld: 223 mg/dL — ABNORMAL HIGH (ref 70–99)
Potassium: 4.7 mmol/L (ref 3.5–5.1)
Potassium: 4.9 mmol/L (ref 3.5–5.1)
Sodium: 129 mmol/L — ABNORMAL LOW (ref 135–145)
Sodium: 132 mmol/L — ABNORMAL LOW (ref 135–145)

## 2023-03-16 LAB — BLOOD GAS, VENOUS
Acid-base deficit: 5.8 mmol/L — ABNORMAL HIGH (ref 0.0–2.0)
Bicarbonate: 20.1 mmol/L (ref 20.0–28.0)
O2 Saturation: 69.8 %
Patient temperature: 37
pCO2, Ven: 40 mmHg — ABNORMAL LOW (ref 44–60)
pH, Ven: 7.31 (ref 7.25–7.43)
pO2, Ven: 37 mmHg (ref 32–45)

## 2023-03-16 LAB — CBC
HCT: 45.6 % (ref 39.0–52.0)
Hemoglobin: 15 g/dL (ref 13.0–17.0)
MCH: 31.1 pg (ref 26.0–34.0)
MCHC: 32.9 g/dL (ref 30.0–36.0)
MCV: 94.6 fL (ref 80.0–100.0)
Platelets: 274 10*3/uL (ref 150–400)
RBC: 4.82 MIL/uL (ref 4.22–5.81)
RDW: 13.1 % (ref 11.5–15.5)
WBC: 15 10*3/uL — ABNORMAL HIGH (ref 4.0–10.5)
nRBC: 0 % (ref 0.0–0.2)

## 2023-03-16 LAB — URINALYSIS, ROUTINE W REFLEX MICROSCOPIC
Bacteria, UA: NONE SEEN
Bilirubin Urine: NEGATIVE
Glucose, UA: >=500 mg/dL — AB
Hgb urine dipstick: NEGATIVE
Ketones, ur: 80 mg/dL — AB
Leukocytes,Ua: NEGATIVE
Nitrite: NEGATIVE
Protein, ur: 30 mg/dL — AB
Specific Gravity, Urine: 1.028 (ref 1.005–1.030)
pH: 5 (ref 5.0–8.0)

## 2023-03-16 LAB — CBG MONITORING, ED: Glucose-Capillary: 368 mg/dL — ABNORMAL HIGH (ref 70–99)

## 2023-03-16 MED ORDER — GLIPIZIDE ER 10 MG PO TB24: ORAL_TABLET | ORAL | 1 refills | Status: DC

## 2023-03-16 MED ORDER — SODIUM CHLORIDE 0.9 % IV SOLN: Freq: Once | INTRAVENOUS | Status: AC

## 2023-03-16 MED ORDER — INSULIN ASPART 100 UNIT/ML IJ SOLN
5.0000 [IU] | Freq: Once | INTRAMUSCULAR | Status: AC
  Administered 2023-03-16: 5 [IU] via INTRAVENOUS
  Filled 2023-03-16: qty 1

## 2023-03-16 MED ORDER — SITAGLIPTIN PHOSPHATE 100 MG PO TABS: 100.0000 mg | ORAL_TABLET | Freq: Every day | ORAL | 0 refills | Status: DC

## 2023-03-16 MED ORDER — ONDANSETRON HCL 4 MG/2ML IJ SOLN
4.0000 mg | Freq: Once | INTRAMUSCULAR | Status: AC
  Administered 2023-03-16: 4 mg via INTRAVENOUS
  Filled 2023-03-16: qty 2

## 2023-03-16 MED ORDER — SODIUM CHLORIDE 0.9 % IV BOLUS
500.0000 mL | Freq: Once | INTRAVENOUS | Status: AC
  Administered 2023-03-16: 500 mL via INTRAVENOUS

## 2023-03-16 NOTE — ED Triage Notes (Signed)
Pt to ED via POV accompanied by wife c/o abnormal lab, had blood drawn on Thursday, was called today and told glucose was 440 . Pt reports hx DM, but does not take medication. Also reports has been off meds for 3 weeks due to "changing doctors"

## 2023-03-16 NOTE — ED Provider Notes (Signed)
[facility] Provider Note    Event Date/Time   First MD Initiated Contact with Patient 03/16/23 1211     (approximate)   History   Hyperglycemia and Abnormal Lab   HPI  Oscar Mills is a 72 y.o. male sent in for evaluation of elevated glucose.  Patient reports he has been out of his medications for about 3 weeks, overall he feels well has no significant complaints.  No nausea or vomiting     Physical Exam   Triage Vital Signs: ED Triage Vitals  Encounter Vitals Group     BP 03/16/23 1144 (!) 141/72     Systolic BP Percentile --      Diastolic BP Percentile --      Pulse Rate 03/16/23 1144 87     Resp 03/16/23 1144 16     Temp 03/16/23 1144 98 F (36.7 C)     Temp Source 03/16/23 1144 Oral     SpO2 03/16/23 1144 99 %     Weight 03/16/23 1146 55.3 kg (122 lb)     Height 03/16/23 1146 1.88 m (6\' 2" )     Head Circumference --      Peak Flow --      Pain Score 03/16/23 1145 5     Pain Loc --      Pain Education --      Exclude from Growth Chart --     Most recent vital signs: Vitals:   03/16/23 1144  BP: (!) 141/72  Pulse: 87  Resp: 16  Temp: 98 F (36.7 C)  SpO2: 99%     General: Awake, no distress.  CV:  Good peripheral perfusion.  Resp:  Normal effort.  Abd:  No distention.  Soft, nontender Other:     ED Results / Procedures / Treatments   Labs (all labs ordered are listed, but only abnormal results are displayed) Labs Reviewed  BASIC METABOLIC PANEL - Abnormal; Notable for the following components:      Result Value   Sodium 129 (*)    Chloride 97 (*)    CO2 18 (*)    Glucose, Bld 327 (*)    All other components within normal limits  CBC - Abnormal; Notable for the following components:   WBC 15.0 (*)    All other components within normal limits  URINALYSIS, ROUTINE W REFLEX MICROSCOPIC - Abnormal; Notable for the following components:   Color, Urine YELLOW (*)    APPearance CLEAR (*)    Glucose, UA >=500  (*)    Ketones, ur 80 (*)    Protein, ur 30 (*)    All other components within normal limits  BLOOD GAS, VENOUS - Abnormal; Notable for the following components:   pCO2, Ven 40 (*)    Acid-base deficit 5.8 (*)    All other components within normal limits  BASIC METABOLIC PANEL - Abnormal; Notable for the following components:   Sodium 132 (*)    CO2 16 (*)    Glucose, Bld 223 (*)    All other components within normal limits  CBG MONITORING, ED - Abnormal; Notable for the following components:   Glucose-Capillary 368 (*)    All other components within normal limits  CBG MONITORING, ED     EKG     RADIOLOGY     PROCEDURES:  Critical Care performed:   Procedures   MEDICATIONS ORDERED IN ED: Medications  0.9 %  sodium chloride infusion (0 mLs Intravenous  Stopped 03/16/23 1431)  ondansetron (ZOFRAN) injection 4 mg (4 mg Intravenous Given 03/16/23 1325)  insulin aspart (novoLOG) injection 5 Units (5 Units Intravenous Given 03/16/23 1325)  sodium chloride 0.9 % bolus 500 mL (0 mLs Intravenous Stopped 03/16/23 1527)     IMPRESSION / MDM / ASSESSMENT AND PLAN / ED COURSE  I reviewed the triage vital signs and the nursing notes. Patient's presentation is most consistent with acute presentation with potential threat to life or bodily function.   Patient presents with hyperglycemia, differential includes hyperglycemia versus DKA  No tachycardia and overall well-appearing pending labs  Lab work demonstrates normal anion gap of 14 however CO2 is mildly low at 18.  pH is reassuring at 7.31.  Will treat with IV fluids, insulin and reevaluate.  ----------------------------------------- 4:05 PM on 03/16/2023 ----------------------------------------- Repeat BMP demonstrates improved glucose anion gap has normalized, CO2 remains abnormal however patient's vital signs are normal, he feels quite well and has no interest in being admitted to the hospital.  His wife reports insulin  prescription is at the pharmacy, they agree to return if any worsening of his symptoms     FINAL CLINICAL IMPRESSION(S) / ED DIAGNOSES   Final diagnoses:  Hyperglycemia     Rx / DC Orders   ED Discharge Orders     None        Note:  This document was prepared using Dragon voice recognition software and may include unintentional dictation errors.   Jene Every, MD 03/16/23 [phone]

## 2023-03-18 ENCOUNTER — Telehealth: Payer: Self-pay | Admitting: Family Medicine

## 2023-03-18 ENCOUNTER — Ambulatory Visit: Payer: Self-pay | Admitting: *Deleted

## 2023-03-18 NOTE — Telephone Encounter (Signed)
Pharmacy needs a max dose in order to fill- please resend Rx with max dose daily on it

## 2023-03-18 NOTE — Telephone Encounter (Signed)
Summary: Rx clarification   Megan with [facility] called for clarification of some prescriptions. Cb# [phone]         Reason for Disposition . [1] Caller has NON-URGENT medicine question about med that PCP prescribed AND [2] triager unable to answer question  Answer Assessment - Initial Assessment Questions 1. NAME of MEDICINE: "What medicine(s) are you calling about?"     insulin glargine (LANTUS SOLOSTAR) 100 UNIT/ML Solostar Pen 2. QUESTION: "What is your question?" (e.g., double dose of medicine, side effect)     Pharmacy needs a max dose in order to fill- please resend Rx with max dose daily on it 3. PRESCRIBER: "Who prescribed the medicine?" Reason: if prescribed by specialist, call should be referred to that group.     Suzie Portela  Protocols used: Medication Question Call-A-AH

## 2023-03-18 NOTE — Telephone Encounter (Signed)
[facility] pharmacy is prescription refill

## 2023-03-18 NOTE — Telephone Encounter (Signed)
  Chief Complaint: Pharmacy needs more information for ZOX:[id number] glargine (LANTUS SOLOSTAR) 100 UNIT/ML Solostar Pen  Request max dose Disposition: [] ED /[] Urgent Care (no appt availability in office) / [] Appointment(In office/virtual)/ []  [facility]/ [] Home Care/ [] Refused Recommended Disposition /[] [place] [facility]/ [x]  Follow-up with PCP Additional Notes: Pharmacy request max dose of medication.

## 2023-03-20 ENCOUNTER — Other Ambulatory Visit: Payer: Self-pay | Admitting: Family Medicine

## 2023-03-20 ENCOUNTER — Other Ambulatory Visit (HOSPITAL_COMMUNITY): Payer: Self-pay

## 2023-03-20 DIAGNOSIS — E1165 Type 2 diabetes mellitus with hyperglycemia: Secondary | ICD-10-CM

## 2023-03-20 MED ORDER — LANTUS SOLOSTAR 100 UNIT/ML ~~LOC~~ SOPN
50.0000 [IU] | PEN_INJECTOR | Freq: Every day | SUBCUTANEOUS | 0 refills | Status: DC
Start: 2023-03-20 — End: 2023-03-20
  Filled 2023-03-20: qty 15, 30d supply, fill #0

## 2023-03-20 MED ORDER — LANTUS SOLOSTAR 100 UNIT/ML ~~LOC~~ SOPN
50.0000 [IU] | PEN_INJECTOR | Freq: Every day | SUBCUTANEOUS | 0 refills | Status: DC
Start: 2023-03-20 — End: 2023-04-17

## 2023-03-22 ENCOUNTER — Telehealth: Payer: Self-pay | Admitting: Family Medicine

## 2023-03-22 ENCOUNTER — Inpatient Hospital Stay: Payer: Medicare HMO

## 2023-03-22 ENCOUNTER — Inpatient Hospital Stay: Payer: Medicare HMO | Attending: Oncology | Admitting: Oncology

## 2023-03-22 ENCOUNTER — Encounter: Payer: Self-pay | Admitting: Oncology

## 2023-03-22 VITALS — BP 124/65 | HR 84 | Temp 96.0°F | Resp 16 | Ht 74.0 in | Wt 118.0 lb

## 2023-03-22 DIAGNOSIS — Z79899 Other long term (current) drug therapy: Secondary | ICD-10-CM | POA: Insufficient documentation

## 2023-03-22 DIAGNOSIS — R6881 Early satiety: Secondary | ICD-10-CM | POA: Diagnosis not present

## 2023-03-22 DIAGNOSIS — N2889 Other specified disorders of kidney and ureter: Secondary | ICD-10-CM | POA: Insufficient documentation

## 2023-03-22 DIAGNOSIS — E1151 Type 2 diabetes mellitus with diabetic peripheral angiopathy without gangrene: Secondary | ICD-10-CM | POA: Diagnosis not present

## 2023-03-22 DIAGNOSIS — R634 Abnormal weight loss: Secondary | ICD-10-CM | POA: Diagnosis not present

## 2023-03-22 DIAGNOSIS — Z7984 Long term (current) use of oral hypoglycemic drugs: Secondary | ICD-10-CM | POA: Diagnosis not present

## 2023-03-22 DIAGNOSIS — F17210 Nicotine dependence, cigarettes, uncomplicated: Secondary | ICD-10-CM | POA: Insufficient documentation

## 2023-03-22 DIAGNOSIS — Z794 Long term (current) use of insulin: Secondary | ICD-10-CM | POA: Diagnosis not present

## 2023-03-22 DIAGNOSIS — Z8719 Personal history of other diseases of the digestive system: Secondary | ICD-10-CM | POA: Insufficient documentation

## 2023-03-22 DIAGNOSIS — Z7902 Long term (current) use of antithrombotics/antiplatelets: Secondary | ICD-10-CM | POA: Diagnosis not present

## 2023-03-22 DIAGNOSIS — R109 Unspecified abdominal pain: Secondary | ICD-10-CM | POA: Diagnosis not present

## 2023-03-22 DIAGNOSIS — E1140 Type 2 diabetes mellitus with diabetic neuropathy, unspecified: Secondary | ICD-10-CM | POA: Diagnosis not present

## 2023-03-22 DIAGNOSIS — E785 Hyperlipidemia, unspecified: Secondary | ICD-10-CM | POA: Diagnosis not present

## 2023-03-22 DIAGNOSIS — I10 Essential (primary) hypertension: Secondary | ICD-10-CM | POA: Diagnosis not present

## 2023-03-22 DIAGNOSIS — K219 Gastro-esophageal reflux disease without esophagitis: Secondary | ICD-10-CM | POA: Diagnosis not present

## 2023-03-22 LAB — CBC WITH DIFFERENTIAL (CANCER CENTER ONLY)
Abs Immature Granulocytes: 0.11 10*3/uL — ABNORMAL HIGH (ref 0.00–0.07)
Basophils Absolute: 0.1 10*3/uL (ref 0.0–0.1)
Basophils Relative: 0 %
Eosinophils Absolute: 0 10*3/uL (ref 0.0–0.5)
Eosinophils Relative: 0 %
HCT: 43.7 % (ref 39.0–52.0)
Hemoglobin: 14.7 g/dL (ref 13.0–17.0)
Immature Granulocytes: 1 %
Lymphocytes Relative: 13 %
Lymphs Abs: 1.7 10*3/uL (ref 0.7–4.0)
MCH: 31.4 pg (ref 26.0–34.0)
MCHC: 33.6 g/dL (ref 30.0–36.0)
MCV: 93.4 fL (ref 80.0–100.0)
Monocytes Absolute: 0.8 10*3/uL (ref 0.1–1.0)
Monocytes Relative: 6 %
Neutro Abs: 10.8 10*3/uL — ABNORMAL HIGH (ref 1.7–7.7)
Neutrophils Relative %: 80 %
Platelet Count: 280 10*3/uL (ref 150–400)
RBC: 4.68 MIL/uL (ref 4.22–5.81)
RDW: 12.9 % (ref 11.5–15.5)
WBC Count: 13.6 10*3/uL — ABNORMAL HIGH (ref 4.0–10.5)
nRBC: 0 % (ref 0.0–0.2)

## 2023-03-22 NOTE — Telephone Encounter (Signed)
Patient wife advised. Verbalized understanding and would like to know if referral could be sent to [facility] in [place]

## 2023-03-22 NOTE — Telephone Encounter (Signed)
Pt's wife is calling in because [facility] and the provider wanted to know what Robynn Pane was going to do regarding pt's insulin. They also wanted her to prescribe something for pt's stomach pain. Pt's wife says all scripts can be sent to [facility] on [address] [name] is requesting a call back regarding the insulin.

## 2023-03-22 NOTE — Progress Notes (Signed)
Hematology/Oncology Consult note [facility] Telephone:([phone] [phone]  Patient Care Team: Sherlyn Hay, DO as PCP - General (Family Medicine) Iran Ouch, MD as PCP - Cardiology (Cardiology)   Name of the [name]  [id number]  01/30/1951    Reason for referral-abnormal weight loss   Referring physician- DR. Maralyn Sago pur[place]   Date of visit: 03/22/23   History of presenting illness- Patient is a 72 year old male with a past medical history significant for hypertension, type 2 diabetes and peripheral arterial disease.  He has been referred for abnormal weight loss.  Patient weighed 162 pounds in September 2022 133 pounds in July 2023 and 140 pounds in January 2024.  In September 2024 he weighed 118 pounds.He had a CT abdomen pelvis without contrast in July 2023 which showed a cystic 6 cm right renal lesion with a soft tissue contusion: Concerning for renal cell carcinoma.  Renal mass protocol CT or MRI was recommended.  Nonspecific retroperitoneal lymph nodes were noted at that time as well.  He has not had those scans done so far.On his peripheral blood work patient noted to have persistent mild leukocytosis with a white cell count fluctuating between 10-15 with predominant neutrophilia.  Hemoglobin and platelet counts have been normal.  BMP shows persistent hyperglycemia with blood sugars fluctuating between 200-400.  PSA on 03/14/2023 was normal at 0.4.  Patient has baseline neuropathy from his diabetes.  He reports early satiety as well as on and off abdominal pain.  He had alcohol induced pancreatitis few years ago.  Denies any blood in his stools or dark melanotic stools.  He has been using occasional ibuprofen for his abdominal pain  ECOG PS- 2  Pain scale- 4   Review of systems- Review of Systems  Constitutional:  Positive for malaise/fatigue and weight loss. Negative for chills and fever.  HENT:  Negative for  congestion, ear discharge and nosebleeds.   Eyes:  Negative for blurred vision.  Respiratory:  Negative for cough, hemoptysis, sputum production, shortness of breath and wheezing.   Cardiovascular:  Negative for chest pain, palpitations, orthopnea and claudication.  Gastrointestinal:  Positive for abdominal pain. Negative for blood in stool, constipation, diarrhea, heartburn, melena, nausea and vomiting.  Genitourinary:  Negative for dysuria, flank pain, frequency, hematuria and urgency.  Musculoskeletal:  Negative for back pain, joint pain and myalgias.  Skin:  Negative for rash.  Neurological:  Negative for dizziness, tingling, focal weakness, seizures, weakness and headaches.  Endo/Heme/Allergies:  Does not bruise/bleed easily.  Psychiatric/Behavioral:  Negative for depression and suicidal ideas. The patient does not have insomnia.     Allergies  Allergen Reactions   Iohexol      Code: HIVES, Desc: HIVES WITH OMNIPAQUE 300 OBSERVED BY DR Gery Pray NO MEDS GIVEN     Patient Active Problem List   Diagnosis Date Noted   Hypertension associated with diabetes (HCC) 03/14/2023   Hyperlipidemia due to type 2 diabetes mellitus (HCC) 03/14/2023   Pancreatic insufficiency 03/14/2023   Weight loss 03/14/2023   Gastroesophageal reflux disease without esophagitis 03/14/2023   Screening PSA (prostate specific antigen) 03/14/2023   Annual physical exam 03/14/2023   Type 2 diabetes mellitus with complication, without long-term current use of insulin (HCC) 10/29/2019   PAD (peripheral artery disease) (HCC) 01/21/2014   Smoker 10/15/2012     Past Medical History:  Diagnosis Date   Arthritis    NECK   Diabetes mellitus, type II (HCC)    Dysfunctional alcohol  LT EIA Distal                 125       triphasic                 +-------------+-------+----------+----------+---------+--------+--------+ IVC/Iliac Findings: +-------+------+--------+--------+   IVC  PatentThrombusComments +-------+------+--------+--------+ IVC Midpatent                 +-------+------+--------+--------+   Right Stent(s): +-------------------+--------+--------+---------+--------+ common iliac arteryPSV cm/sStenosisWaveform Comments +-------------------+--------+--------+---------+--------+ Prox  to Stent      48              triphasic         +-------------------+--------+--------+---------+--------+ Proximal Stent     73              triphasic         +-------------------+--------+--------+---------+--------+ Mid Stent          139             triphasic         +-------------------+--------+--------+---------+--------+ Distal Stent       143             triphasic         +-------------------+--------+--------+---------+--------+ Distal to Stent    146             triphasic         +-------------------+--------+--------+---------+--------+   Left Stent(s): +-------------------+--------+--------+---------+--------+ common iliac arteryPSV cm/sStenosisWaveform Comments +-------------------+--------+--------+---------+--------+ Prox to Stent      86              triphasic         +-------------------+--------+--------+---------+--------+ Proximal Stent     87              triphasic         +-------------------+--------+--------+---------+--------+ Mid Stent          107             triphasic         +-------------------+--------+--------+---------+--------+ Distal Stent       130                               +-------------------+--------+--------+---------+--------+ Distal to Stent    119             triphasic         +-------------------+--------+--------+---------+--------+    Summary: Abdominal Aorta: No evidence of an abdominal aortic aneurysm was visualized. The largest aortic measurement is 2.3 cm. The largest aortic diameter has increased compared to prior exam. Previous diameter measurement was 1.9 cm obtained on 01/2022.  *See table(s) above for measurements and observations. Suggest follow up study in 12 months.  Electronically signed by Lorine Bears MD on 02/22/2023 at 12:08:13 PM.    Final    VAS [place] ABI WITH/WO TBI  Result Date: 02/22/2023  LOWER EXTREMITY DOPPLER STUDY Patient Name:  Oscar Mills  Date of Exam:   02/22/2023  Medical Rec #: [id number]           Accession #:    [id number] Date of Birth: 09-Mar-1951           Patient Gender: M Patient Age:   24 years Exam Location:  [facility] Procedure:      VAS [place] ABI WITH/WO TBI Referring Phys: Iran Ouch MD --------------------------------------------------------------------------------  Indications: Peripheral artery disease. Known right SFA occlusion High Risk Factors: Hypertension, hyperlipidemia, Diabetes, current  LT EIA Distal                 125       triphasic                 +-------------+-------+----------+----------+---------+--------+--------+ IVC/Iliac Findings: +-------+------+--------+--------+   IVC  PatentThrombusComments +-------+------+--------+--------+ IVC Midpatent                 +-------+------+--------+--------+   Right Stent(s): +-------------------+--------+--------+---------+--------+ common iliac arteryPSV cm/sStenosisWaveform Comments +-------------------+--------+--------+---------+--------+ Prox  to Stent      48              triphasic         +-------------------+--------+--------+---------+--------+ Proximal Stent     73              triphasic         +-------------------+--------+--------+---------+--------+ Mid Stent          139             triphasic         +-------------------+--------+--------+---------+--------+ Distal Stent       143             triphasic         +-------------------+--------+--------+---------+--------+ Distal to Stent    146             triphasic         +-------------------+--------+--------+---------+--------+   Left Stent(s): +-------------------+--------+--------+---------+--------+ common iliac arteryPSV cm/sStenosisWaveform Comments +-------------------+--------+--------+---------+--------+ Prox to Stent      86              triphasic         +-------------------+--------+--------+---------+--------+ Proximal Stent     87              triphasic         +-------------------+--------+--------+---------+--------+ Mid Stent          107             triphasic         +-------------------+--------+--------+---------+--------+ Distal Stent       130                               +-------------------+--------+--------+---------+--------+ Distal to Stent    119             triphasic         +-------------------+--------+--------+---------+--------+    Summary: Abdominal Aorta: No evidence of an abdominal aortic aneurysm was visualized. The largest aortic measurement is 2.3 cm. The largest aortic diameter has increased compared to prior exam. Previous diameter measurement was 1.9 cm obtained on 01/2022.  *See table(s) above for measurements and observations. Suggest follow up study in 12 months.  Electronically signed by Lorine Bears MD on 02/22/2023 at 12:08:13 PM.    Final    VAS [place] ABI WITH/WO TBI  Result Date: 02/22/2023  LOWER EXTREMITY DOPPLER STUDY Patient Name:  Oscar Mills  Date of Exam:   02/22/2023  Medical Rec #: [id number]           Accession #:    [id number] Date of Birth: 09-Mar-1951           Patient Gender: M Patient Age:   24 years Exam Location:  [facility] Procedure:      VAS [place] ABI WITH/WO TBI Referring Phys: Iran Ouch MD --------------------------------------------------------------------------------  Indications: Peripheral artery disease. Known right SFA occlusion High Risk Factors: Hypertension, hyperlipidemia, Diabetes, current  Vitals:   03/22/23 1053  Weight: 118 lb (53.5 kg)   Physical Exam Constitutional:      Comments: Appears fatigued and cachectic  Cardiovascular:     Rate and Rhythm: Normal rate and regular rhythm.     Heart sounds: Normal heart sounds.  Pulmonary:     Effort: Pulmonary effort is normal.     Breath sounds: Normal breath sounds.  Abdominal:     General: Bowel sounds are normal.     Palpations: Abdomen is soft.     Comments: No palpable hepatosplenomegaly  Lymphadenopathy:     Comments: No palpable cervical, supraclavicular, axillary or inguinal adenopathy    Skin:    General: Skin is warm and dry.  Neurological:     Mental Status: He is alert and oriented to person, place, and time.           Latest Ref Rng & Units 03/16/2023    3:00 PM  CMP  Glucose 70 - 99 mg/dL 469   BUN 8 - 23 mg/dL 14   Creatinine 6.29 - 1.24 mg/dL 5.28   Sodium 413 - 244 mmol/L 132   Potassium 3.5 - 5.1 mmol/L 4.9   Chloride 98 - 111 mmol/L 104   CO2 22 - 32 mmol/L 16   Calcium 8.9 - 10.3 mg/dL 9.0       Latest Ref Rng & Units 03/16/2023   11:47 AM  CBC  WBC 4.0 - 10.5 K/uL 15.0   Hemoglobin 13.0 - 17.0 g/dL 01.0   Hematocrit 27.2 - 52.0 % 45.6   Platelets 150 - 400 K/uL 274     No images are attached to the encounter.  VAS US AORTA/IVC/ILIACS  Result Date: 02/22/2023 ABDOMINAL AORTA STUDY Patient Name:  Oscar Mills  Date of Exam:   02/22/2023 Medical Rec #: [id number]           Accession #:    [id number] Date of  Birth: 1950-07-08           Patient Gender: M Patient Age:   35 years Exam Location:  [facility] Procedure:      VAS US AORTA/IVC/ILIACS Referring Phys: Jerolyn Center ARIDA --------------------------------------------------------------------------------  Indications: s/p vascular intervention Risk Factors: Hypertension, hyperlipidemia, Diabetes, current smoker.  Comparison Study: 02/01/22 aorta duplex exam showed normal velocities and no                   dilated vessels Performing Technologist: Quentin Ore RDMS, RVT, RDCS  Examination Guidelines: A complete evaluation includes B-mode imaging, spectral Doppler, color Doppler, and power Doppler as needed of all accessible portions of each vessel. Bilateral testing is considered an integral part of a complete examination. Limited examinations for reoccurring indications may be performed as noted.  Abdominal Aorta Findings: +-------------+-------+----------+----------+---------+--------+--------+ Location     AP (cm)Trans (cm)PSV (cm/s)Waveform ThrombusComments +-------------+-------+----------+----------+---------+--------+--------+ Supraceliac                   93                                  +-------------+-------+----------+----------+---------+--------+--------+ Proximal     2.30   2.30      102                                 +-------------+-------+----------+----------+---------+--------+--------+ Mid          1.50  LT EIA Distal                 125       triphasic                 +-------------+-------+----------+----------+---------+--------+--------+ IVC/Iliac Findings: +-------+------+--------+--------+   IVC  PatentThrombusComments +-------+------+--------+--------+ IVC Midpatent                 +-------+------+--------+--------+   Right Stent(s): +-------------------+--------+--------+---------+--------+ common iliac arteryPSV cm/sStenosisWaveform Comments +-------------------+--------+--------+---------+--------+ Prox  to Stent      48              triphasic         +-------------------+--------+--------+---------+--------+ Proximal Stent     73              triphasic         +-------------------+--------+--------+---------+--------+ Mid Stent          139             triphasic         +-------------------+--------+--------+---------+--------+ Distal Stent       143             triphasic         +-------------------+--------+--------+---------+--------+ Distal to Stent    146             triphasic         +-------------------+--------+--------+---------+--------+   Left Stent(s): +-------------------+--------+--------+---------+--------+ common iliac arteryPSV cm/sStenosisWaveform Comments +-------------------+--------+--------+---------+--------+ Prox to Stent      86              triphasic         +-------------------+--------+--------+---------+--------+ Proximal Stent     87              triphasic         +-------------------+--------+--------+---------+--------+ Mid Stent          107             triphasic         +-------------------+--------+--------+---------+--------+ Distal Stent       130                               +-------------------+--------+--------+---------+--------+ Distal to Stent    119             triphasic         +-------------------+--------+--------+---------+--------+    Summary: Abdominal Aorta: No evidence of an abdominal aortic aneurysm was visualized. The largest aortic measurement is 2.3 cm. The largest aortic diameter has increased compared to prior exam. Previous diameter measurement was 1.9 cm obtained on 01/2022.  *See table(s) above for measurements and observations. Suggest follow up study in 12 months.  Electronically signed by Lorine Bears MD on 02/22/2023 at 12:08:13 PM.    Final    VAS [place] ABI WITH/WO TBI  Result Date: 02/22/2023  LOWER EXTREMITY DOPPLER STUDY Patient Name:  Oscar Mills  Date of Exam:   02/22/2023  Medical Rec #: [id number]           Accession #:    [id number] Date of Birth: 09-Mar-1951           Patient Gender: M Patient Age:   24 years Exam Location:  [facility] Procedure:      VAS [place] ABI WITH/WO TBI Referring Phys: Iran Ouch MD --------------------------------------------------------------------------------  Indications: Peripheral artery disease. Known right SFA occlusion High Risk Factors: Hypertension, hyperlipidemia, Diabetes, current  Vitals:   03/22/23 1053  Weight: 118 lb (53.5 kg)   Physical Exam Constitutional:      Comments: Appears fatigued and cachectic  Cardiovascular:     Rate and Rhythm: Normal rate and regular rhythm.     Heart sounds: Normal heart sounds.  Pulmonary:     Effort: Pulmonary effort is normal.     Breath sounds: Normal breath sounds.  Abdominal:     General: Bowel sounds are normal.     Palpations: Abdomen is soft.     Comments: No palpable hepatosplenomegaly  Lymphadenopathy:     Comments: No palpable cervical, supraclavicular, axillary or inguinal adenopathy    Skin:    General: Skin is warm and dry.  Neurological:     Mental Status: He is alert and oriented to person, place, and time.           Latest Ref Rng & Units 03/16/2023    3:00 PM  CMP  Glucose 70 - 99 mg/dL 469   BUN 8 - 23 mg/dL 14   Creatinine 6.29 - 1.24 mg/dL 5.28   Sodium 413 - 244 mmol/L 132   Potassium 3.5 - 5.1 mmol/L 4.9   Chloride 98 - 111 mmol/L 104   CO2 22 - 32 mmol/L 16   Calcium 8.9 - 10.3 mg/dL 9.0       Latest Ref Rng & Units 03/16/2023   11:47 AM  CBC  WBC 4.0 - 10.5 K/uL 15.0   Hemoglobin 13.0 - 17.0 g/dL 01.0   Hematocrit 27.2 - 52.0 % 45.6   Platelets 150 - 400 K/uL 274     No images are attached to the encounter.  VAS US AORTA/IVC/ILIACS  Result Date: 02/22/2023 ABDOMINAL AORTA STUDY Patient Name:  Oscar Mills  Date of Exam:   02/22/2023 Medical Rec #: [id number]           Accession #:    [id number] Date of  Birth: 1950-07-08           Patient Gender: M Patient Age:   35 years Exam Location:  [facility] Procedure:      VAS US AORTA/IVC/ILIACS Referring Phys: Jerolyn Center ARIDA --------------------------------------------------------------------------------  Indications: s/p vascular intervention Risk Factors: Hypertension, hyperlipidemia, Diabetes, current smoker.  Comparison Study: 02/01/22 aorta duplex exam showed normal velocities and no                   dilated vessels Performing Technologist: Quentin Ore RDMS, RVT, RDCS  Examination Guidelines: A complete evaluation includes B-mode imaging, spectral Doppler, color Doppler, and power Doppler as needed of all accessible portions of each vessel. Bilateral testing is considered an integral part of a complete examination. Limited examinations for reoccurring indications may be performed as noted.  Abdominal Aorta Findings: +-------------+-------+----------+----------+---------+--------+--------+ Location     AP (cm)Trans (cm)PSV (cm/s)Waveform ThrombusComments +-------------+-------+----------+----------+---------+--------+--------+ Supraceliac                   93                                  +-------------+-------+----------+----------+---------+--------+--------+ Proximal     2.30   2.30      102                                 +-------------+-------+----------+----------+---------+--------+--------+ Mid          1.50  Hematology/Oncology Consult note [facility] [phone] [phone]  Patient Care Team: Sherlyn Hay, DO as PCP - General (Family Medicine) Iran Ouch, MD as PCP - Cardiology (Cardiology)   Name of the [name]  [id number]  01/30/1951    Reason for referral-abnormal weight loss   Referring physician- DR. Maralyn Sago [place]   Date of visit: 03/22/23   History of presenting illness- Patient is a 72 year old male with a past medical history significant for hypertension, type 2 diabetes and peripheral arterial disease.  He has been referred for abnormal weight loss.  Patient weighed 162 pounds in September 2022 133 pounds in July 2023 and 140 pounds in January 2024.  In September 2024 he weighed 118 pounds.He had a CT abdomen pelvis without contrast in July 2023 which showed a cystic 6 cm right renal lesion with a soft tissue contusion: Concerning for renal cell carcinoma.  Renal mass protocol CT or MRI was recommended.  Nonspecific retroperitoneal lymph nodes were noted at that time as well.  He has not had those scans done so far.On his peripheral blood work patient noted to have persistent mild leukocytosis with a white cell count fluctuating between 10-15 with predominant neutrophilia.  Hemoglobin and platelet counts have been normal.  BMP shows persistent hyperglycemia with blood sugars fluctuating between 200-400.  PSA on 03/14/2023 was normal at 0.4.  Patient has baseline neuropathy from his diabetes.  He reports early satiety as well as on and off abdominal pain.  He had alcohol induced pancreatitis few years ago.  Denies any blood in his stools or dark melanotic stools.  He has been using occasional ibuprofen for his abdominal pain  ECOG PS- 2  Pain scale- 4   Review of systems- Review of Systems  Constitutional:  Positive for malaise/fatigue and weight loss. Negative for chills and fever.  HENT:  Negative for  congestion, ear discharge and nosebleeds.   Eyes:  Negative for blurred vision.  Respiratory:  Negative for cough, hemoptysis, sputum production, shortness of breath and wheezing.   Cardiovascular:  Negative for chest pain, palpitations, orthopnea and claudication.  Gastrointestinal:  Positive for abdominal pain. Negative for blood in stool, constipation, diarrhea, heartburn, melena, nausea and vomiting.  Genitourinary:  Negative for dysuria, flank pain, frequency, hematuria and urgency.  Musculoskeletal:  Negative for back pain, joint pain and myalgias.  Skin:  Negative for rash.  Neurological:  Negative for dizziness, tingling, focal weakness, seizures, weakness and headaches.  Endo/Heme/Allergies:  Does not bruise/bleed easily.  Psychiatric/Behavioral:  Negative for depression and suicidal ideas. The patient does not have insomnia.     Allergies  Allergen Reactions   Iohexol      Code: HIVES, Desc: HIVES WITH OMNIPAQUE 300 OBSERVED BY DR Gery Pray NO MEDS GIVEN     Patient Active Problem List   Diagnosis Date Noted   Hypertension associated with diabetes (HCC) 03/14/2023   Hyperlipidemia due to type 2 diabetes mellitus (HCC) 03/14/2023   Pancreatic insufficiency 03/14/2023   Weight loss 03/14/2023   Gastroesophageal reflux disease without esophagitis 03/14/2023   Screening PSA (prostate specific antigen) 03/14/2023   Annual physical exam 03/14/2023   Type 2 diabetes mellitus with complication, without long-term current use of insulin (HCC) 10/29/2019   PAD (peripheral artery disease) (HCC) 01/21/2014   Smoker 10/15/2012     Past Medical History:  Diagnosis Date   Arthritis    NECK   Diabetes mellitus, type II (HCC)    Dysfunctional alcohol  Vitals:   03/22/23 1053  Weight: 118 lb (53.5 kg)   Physical Exam Constitutional:      Comments: Appears fatigued and cachectic  Cardiovascular:     Rate and Rhythm: Normal rate and regular rhythm.     Heart sounds: Normal heart sounds.  Pulmonary:     Effort: Pulmonary effort is normal.     Breath sounds: Normal breath sounds.  Abdominal:     General: Bowel sounds are normal.     Palpations: Abdomen is soft.     Comments: No palpable hepatosplenomegaly  Lymphadenopathy:     Comments: No palpable cervical, supraclavicular, axillary or inguinal adenopathy    Skin:    General: Skin is warm and dry.  Neurological:     Mental Status: He is alert and oriented to person, place, and time.           Latest Ref Rng & Units 03/16/2023    3:00 PM  CMP  Glucose 70 - 99 mg/dL 469   BUN 8 - 23 mg/dL 14   Creatinine 6.29 - 1.24 mg/dL 5.28   Sodium 413 - 244 mmol/L 132   Potassium 3.5 - 5.1 mmol/L 4.9   Chloride 98 - 111 mmol/L 104   CO2 22 - 32 mmol/L 16   Calcium 8.9 - 10.3 mg/dL 9.0       Latest Ref Rng & Units 03/16/2023   11:47 AM  CBC  WBC 4.0 - 10.5 K/uL 15.0   Hemoglobin 13.0 - 17.0 g/dL 01.0   Hematocrit 27.2 - 52.0 % 45.6   Platelets 150 - 400 K/uL 274     No images are attached to the encounter.  VAS US AORTA/IVC/ILIACS  Result Date: 02/22/2023 ABDOMINAL AORTA STUDY Patient Name:  Oscar Mills  Date of Exam:   02/22/2023 Medical Rec #: [id number]           Accession #:    [id number] Date of  Birth: 1950-07-08           Patient Gender: M Patient Age:   35 years Exam Location:  [facility] Procedure:      VAS US AORTA/IVC/ILIACS Referring Phys: Jerolyn Center ARIDA --------------------------------------------------------------------------------  Indications: s/p vascular intervention Risk Factors: Hypertension, hyperlipidemia, Diabetes, current smoker.  Comparison Study: 02/01/22 aorta duplex exam showed normal velocities and no                   dilated vessels Performing Technologist: Quentin Ore RDMS, RVT, RDCS  Examination Guidelines: A complete evaluation includes B-mode imaging, spectral Doppler, color Doppler, and power Doppler as needed of all accessible portions of each vessel. Bilateral testing is considered an integral part of a complete examination. Limited examinations for reoccurring indications may be performed as noted.  Abdominal Aorta Findings: +-------------+-------+----------+----------+---------+--------+--------+ Location     AP (cm)Trans (cm)PSV (cm/s)Waveform ThrombusComments +-------------+-------+----------+----------+---------+--------+--------+ Supraceliac                   93                                  +-------------+-------+----------+----------+---------+--------+--------+ Proximal     2.30   2.30      102                                 +-------------+-------+----------+----------+---------+--------+--------+ Mid          1.50

## 2023-03-23 NOTE — Addendum Note (Signed)
Addended byJacquenette Shone on: 03/23/2023 10:04 AM   Modules accepted: Orders

## 2023-03-25 ENCOUNTER — Ambulatory Visit: Payer: Self-pay | Admitting: *Deleted

## 2023-03-25 ENCOUNTER — Encounter (INDEPENDENT_AMBULATORY_CARE_PROVIDER_SITE_OTHER): Payer: Self-pay | Admitting: *Deleted

## 2023-03-25 DIAGNOSIS — R110 Nausea: Secondary | ICD-10-CM

## 2023-03-25 LAB — KAPPA/LAMBDA LIGHT CHAINS
Kappa free light chain: 23 mg/L — ABNORMAL HIGH (ref 3.3–19.4)
Kappa, lambda light chain ratio: 1 (ref 0.26–1.65)
Lambda free light chains: 23 mg/L (ref 5.7–26.3)

## 2023-03-25 MED ORDER — ONDANSETRON 4 MG PO TBDP: 4.0000 mg | ORAL_TABLET | Freq: Three times a day (TID) | ORAL | 0 refills | Status: DC | PRN

## 2023-03-25 NOTE — Telephone Encounter (Signed)
Summary:  Per agent: Rx req / stomach discomfort   "The patient's wife has called to request a prescription for their stomach discomfort and hyperglycemia concerns on 03/16/23  The patient has been continuing to have stomach discomfort and would like to be prescribed a medication to help with their discomfort and nausea  Please contact the patient for their stomach discomfort when available"         Chief Complaint: Nausea Symptoms: Nausea, no vomiting. Lower abdominal pain, comes and goes, "Long time." Blood sugars running high "Between 168-200." States urinating "A little less."  Frequency: "Long time." Pertinent Negatives: Patient denies  Disposition: [] ED /[] Urgent Care (no appt availability in office) / [] Appointment(In office/virtual)/ []  [facility]/ [] Home Care/ [] Refused Recommended Disposition /[] [facility]/ [x]  Follow-up with PCP Additional Notes:  Pt evasive historian. Reviewed note from 03/22/23 regarding referral.  Pt requesting "Something for nausea until then."   Please advise.  Reason for Disposition  Nausea is a chronic symptom (recurrent or ongoing AND present > 4 weeks)  Answer Assessment - Initial Assessment Questions 1. NAUSEA SEVERITY: "How bad is the nausea?" (e.g., mild, moderate, severe; dehydration, weight loss)   - MILD: loss of appetite without change in eating habits   - MODERATE: decreased oral intake without significant weight loss, dehydration, or malnutrition   - SEVERE: inadequate caloric or fluid intake, significant weight loss, symptoms of dehydration     Moderate 2. ONSET: "When did the nausea begin?"     "Long time" 3. VOMITING: "Any vomiting?" If Yes, ask: "How many times today?"     No 4. RECURRENT SYMPTOM: "Have you had nausea before?" If Yes, ask: "When was the last time?" "What happened that time?"     Yes 5. CAUSE: "What do you think is causing the nausea?" Also with abdominal pain, lower abd. Comes and goes, "A  while."  Protocols used: Nausea-A-AH

## 2023-03-26 LAB — MULTIPLE MYELOMA PANEL, SERUM
Albumin SerPl Elph-Mcnc: 3.7 g/dL (ref 2.9–4.4)
Albumin/Glob SerPl: 1.4 (ref 0.7–1.7)
Alpha 1: 0.2 g/dL (ref 0.0–0.4)
Alpha2 Glob SerPl Elph-Mcnc: 0.8 g/dL (ref 0.4–1.0)
B-Globulin SerPl Elph-Mcnc: 0.9 g/dL (ref 0.7–1.3)
Gamma Glob SerPl Elph-Mcnc: 0.8 g/dL (ref 0.4–1.8)
Globulin, Total: 2.7 g/dL (ref 2.2–3.9)
IgA: 261 mg/dL (ref 61–437)
IgG (Immunoglobin G), Serum: 855 mg/dL (ref 603–1613)
IgM (Immunoglobulin M), Srm: 194 mg/dL — ABNORMAL HIGH (ref 20–172)
Total Protein ELP: 6.4 g/dL (ref 6.0–8.5)

## 2023-03-28 ENCOUNTER — Encounter (INDEPENDENT_AMBULATORY_CARE_PROVIDER_SITE_OTHER): Payer: Self-pay | Admitting: Gastroenterology

## 2023-03-28 ENCOUNTER — Ambulatory Visit (INDEPENDENT_AMBULATORY_CARE_PROVIDER_SITE_OTHER): Payer: Medicare HMO | Admitting: Gastroenterology

## 2023-03-28 ENCOUNTER — Telehealth (INDEPENDENT_AMBULATORY_CARE_PROVIDER_SITE_OTHER): Payer: Self-pay | Admitting: Gastroenterology

## 2023-03-28 VITALS — BP 102/64 | HR 117 | Temp 97.1°F | Ht 74.0 in | Wt 123.6 lb

## 2023-03-28 DIAGNOSIS — R93429 Abnormal radiologic findings on diagnostic imaging of unspecified kidney: Secondary | ICD-10-CM | POA: Diagnosis not present

## 2023-03-28 DIAGNOSIS — R634 Abnormal weight loss: Secondary | ICD-10-CM

## 2023-03-28 DIAGNOSIS — R933 Abnormal findings on diagnostic imaging of other parts of digestive tract: Secondary | ICD-10-CM | POA: Diagnosis not present

## 2023-03-28 DIAGNOSIS — E43 Unspecified severe protein-calorie malnutrition: Secondary | ICD-10-CM | POA: Insufficient documentation

## 2023-03-28 DIAGNOSIS — E1165 Type 2 diabetes mellitus with hyperglycemia: Secondary | ICD-10-CM | POA: Insufficient documentation

## 2023-03-28 NOTE — Telephone Encounter (Signed)
Left message to call back to schedule an IN OFFICE APPT FOR PRE OP CLEARANCE.

## 2023-03-28 NOTE — Telephone Encounter (Signed)
Name: Oscar Mills  DOB: 04/27/1951  MRN: [id number]  Primary Cardiologist: Lorine Bears, MD  Chart reviewed as part of pre-operative protocol coverage. Because of Oscar Mills past medical history and time since last visit, he will require a follow-up in-office visit in order to better assess preoperative cardiovascular risk.  Pre-op covering staff: - Please schedule appointment and call patient to inform them. Has not been seen by Dr. Kirke Corin since 07/19/2022 and was to be seen annually. Please make appt with APP or Dr. Kirke Corin for clearance in [place] office if slot is available.   - Please contact requesting surgeon's office via preferred method (i.e, phone, fax) to inform them of need for appointment prior to surgery.  This message will also be routed to pharmacy pool and/or Dr Kirke Corin for input on holding Plavix as requested below so that this information is available to the clearing provider at time of patient's appointment.   Joni Reining, NP  03/28/2023, 11:56 AM

## 2023-03-28 NOTE — Patient Instructions (Addendum)
Schedule EGD and colonoscopy Schedule MRI of the abdomen with and without IV contrast Urgent referral for endocrinology for management of severely uncontrolled diabetes Try taking Ensure (sugar-free) 2-3 times a day

## 2023-03-28 NOTE — Telephone Encounter (Signed)
03/28/23  Oscar Mills [birth year]  What type of surgery is being performed? EGD/Colonoscopy  When is surgery scheduled? BD  Clearance to hold Plavix for 5 days  Name of physician performing surgery?  Dr. Katrinka Blazing [facility] Phone: [phone] Fax: [phone]  Anethesia type (none, local, MAC, general)? MAC

## 2023-03-28 NOTE — Progress Notes (Signed)
Oscar Mills, M.D. [facility] [address] [place], [place] [address] Primary Care Physician: Oscar Hay, Oscar Mills [address] [place] [address]  Referring MD: PCP  Chief Complaint:  Weight loss  History of Present Illness: Oscar Mills is a 72 y.o. male with past medical history of diabetes, GERD, hypertension, pancreatitis, peripheral vascular disease who presents for evaluation of weight loss.  Patient comes to the clinic with his wife. They report that for the last 1.5 years he has progressively lost 20 lb. He is eating a fair amount of food as he is constantly hungry, so he does not think this is related to poor oral intake. He reports that for the last couple of weeks he has presented abdominal pain in his mid abdomen, which he describes is an intermittent pain. He also has some intermmitent abdominal bloating. He also endorsed having nausea for the last couple of weeks as well, for which he has been taking Zofran PRN recently.  The patient denies having any vomiting, fever, chills, hematochezia, melena, hematemesis, diarrhea, jaundice, pruritus , acholia or choluria. He usually has 1 formed BM every day.  He was recently started on Remeron 7.5 mg qnight for weight loss.  Based on notes from oncologist Oscar Mills about this and saw the patient on 03/22/2023), the patient was 162 pounds in September 2022.  His weight today is 123 pounds.  She reordered the MRI for evaluation of weight loss as she was concerned about the imaging findings described on a CT scan performed in 2023.  Patient reports that he canceled his upcoming MRI as he did not think he needed to have the MRI performed.  Most recent available cross-sectional abdominal imaging on 01/19/2022 (CT abdomen and pelvis without contrast) showed changes suggestive of diarrheal illness as he had gas/fluid levels throughout the colon, a 6  cm right renal lesion which was suggestive of a renal cell carcinoma.  There was presence of a prominent retroperitoneal lymph nodes measuring 7 mm.  Presence of chronic pancreatitis with mild peripancreatic stranding.  Was also presence of dilation of the main pancreatic duct measuring up to 8 mm.  He was referred by his PCP (Dr. Tanya Mills) to undergo MRI of the abdomen and to start Creon tablets.  He is not currently taking Creon.  Most recent labs from 03/16/2023 showed BMP Na 132 glucose 223, CBC WBC 13.6 with rest of cell lines WNL . His most recent Hb A1C in 03/14/2023 was >15.5.  Wife reports that they have been trying to control his fingersticks as tight as possible but he has fluctuated between high 100s and low 300s.  Last UJW:JXBJY Last Colonoscopy:never  FHx: neg for any gastrointestinal/liver disease, no malignancies Social: 1/2 pack cigarette smoking, neg alcohol or illicit drug use Surgical: umbilical hernia  Past Medical History: Past Medical History:  Diagnosis Date   Arthritis    NECK   Diabetes mellitus, type II (HCC)    Dysfunctional alcohol use    now quit   GERD (gastroesophageal reflux disease)    Hypertension    Neuropathy    feet   Pancreatitis    Peripheral vascular disease (HCC)    Stress fracture of ankle     Past Surgical History: Past Surgical History:  Procedure Laterality Date   ABDOMINAL AORTOGRAM W/LOWER EXTREMITY Bilateral 10/28/2019   Procedure: ABDOMINAL AORTOGRAM W/LOWER EXTREMITY;  Surgeon: Iran Ouch, MD;  [facility]: [facility] IN[facility];  Service: Cardiovascular;  Laterality: Bilateral;   ABDOMINAL AORTOGRAM W/LOWER EXTREMITY Bilateral 06/29/2020   Procedure: ABDOMINAL AORTOGRAM W/LOWER EXTREMITY;  Surgeon: Iran Ouch, MD;  Location: [facility] INVASIVE CV LAB;  Service: Cardiovascular;  Laterality: Bilateral;   CATARACT EXTRACTION W/PHACO Left 09/03/2019   Procedure: CATARACT EXTRACTION PHACO AND INTRAOCULAR LENS PLACEMENT (IOC) LEFT VISION  BLUE 9.84 00:54.9 17.9%;  Surgeon: Elliot Cousin, MD;  Location: [facility];  Service: Ophthalmology;  Laterality: Left;  Diabetic - diet controlled   LOWER EXTREMITY ANGIOGRAM Bilateral 02/24/2014   Procedure: LOWER EXTREMITY ANGIOGRAM;  Surgeon: Iran Ouch, MD;  Location: [facility] CATH LAB;  Service: Cardiovascular;  Laterality: Bilateral;   PERIPHERAL VASCULAR CATHETERIZATION     PERIPHERAL VASCULAR INTERVENTION  10/28/2019   Procedure: PERIPHERAL VASCULAR INTERVENTION;  Surgeon: Iran Ouch, MD;  Location: [facility] IN[facility];  Service: Cardiovascular;;   PERIPHERAL VASCULAR INTERVENTION  06/29/2020   Procedure: PERIPHERAL VASCULAR INTERVENTION;  Surgeon: Iran Ouch, MD;  Location: [facility];  Service: Cardiovascular;;   UMBILICAL HERNIA REPAIR N/A 03/05/2017   Procedure: HERNIA REPAIR UMBILICAL ADULT;  Surgeon: Nadeen Landau, MD;  Location: [facility] ORS;  Service: General;  Laterality: N/A;    Family History: Family History  Problem Relation Age of Onset   Heart attack Father     Social History: Social History   Tobacco Use  Smoking Status Every Day   Current packs/day: 0.50   Average packs/day: 0.5 packs/day for 40.0 years (20.0 ttl pk-yrs)   Types: Cigarettes  Smokeless Tobacco Never   Social History   Substance and Sexual Activity  Alcohol Use Not Currently   Comment: quit around 2012   Social History   Substance and Sexual Activity  Drug Use No    Allergies: Allergies  Allergen Reactions   Iohexol      Code: HIVES, Desc: HIVES WITH OMNIPAQUE 300 OBSERVED BY DR Gery Pray NO MEDS GIVEN     Medications: Current Outpatient Medications  Medication Sig Dispense Refill   acetaminophen (TYLENOL) 500 MG tablet Take 1,000 mg by mouth every 6 (six) hours as needed for moderate pain.     amLODipine (NORVASC) 10 MG tablet TAKE 1 TABLET(10 MG) BY MOUTH DAILY 90 tablet 3   Blood Glucose Monitoring Suppl DEVI 1 each by Does not apply route in the  morning and at bedtime. May substitute to any manufacturer covered by patient's insurance. 1 each 0   clopidogrel (PLAVIX) 75 MG tablet Take 1 tablet (75 mg total) by mouth daily. 90 tablet 3   gabapentin (NEURONTIN) 300 MG capsule Take 300 mg by mouth in the morning and at bedtime.  0   Glucose Blood (BLOOD GLUCOSE TEST STRIPS) STRP 1 each by In Vitro route in the morning and at bedtime. May substitute to any manufacturer covered by patient's insurance. 100 strip 0   insulin glargine (LANTUS SOLOSTAR) 100 UNIT/ML Solostar Pen Inject 50 Units into the skin at bedtime. Check fasting blood sugar every morning; titrate dose by 2 units every 3 days to meet fasting blood sugar goal of <150. Start with 20 units and titrate to 50 units (as estimated max dose) 15 mL 0   Insulin Pen Needle (PEN NEEDLES) 31G X 5 MM MISC 1 each by Does not apply route at bedtime. 100 each 0   Lancet Device MISC 1 each by Does not apply route in the morning and at bedtime. May substitute to any manufacturer covered by patient's insurance. 100  each 0   Lancets Misc. MISC 1 each by Does not apply route in the morning and at bedtime. May substitute to any manufacturer covered by patient's insurance. 100 each 0   ondansetron (ZOFRAN-ODT) 4 MG disintegrating tablet Take 1 tablet (4 mg total) by mouth every 8 (eight) hours as needed for nausea or vomiting. 20 tablet 0   pantoprazole (PROTONIX) 40 MG tablet Take 1 tablet (40 mg total) by mouth daily. 90 tablet 3   rosuvastatin (CRESTOR) 10 MG tablet TAKE 1 TABLET(10 MG) BY MOUTH DAILY 90 tablet 3   mirtazapine (REMERON) 7.5 MG tablet Take 1 tablet (7.5 mg total) by mouth at bedtime. (Patient not taking: Reported on 03/22/2023) 30 tablet 0   sitaGLIPtin (JANUVIA) 100 MG tablet Take 1 tablet (100 mg total) by mouth daily. (Patient not taking: Reported on 03/28/2023) 30 tablet 0   No current facility-administered medications for this visit.    Review of Systems: GENERAL: negative for  malaise, night sweats HEENT: No changes in hearing or vision, no nose bleeds or other nasal problems. NECK: Negative for lumps, goiter, pain and significant neck swelling RESPIRATORY: Negative for cough, wheezing CARDIOVASCULAR: Negative for chest pain, leg swelling, palpitations, orthopnea GI: SEE HPI MUSCULOSKELETAL: Negative for joint pain or swelling, back pain, and muscle pain. SKIN: Negative for lesions, rash PSYCH: Negative for sleep disturbance, mood disorder and recent psychosocial stressors. HEMATOLOGY Negative for prolonged bleeding, bruising easily, and swollen nodes. ENDOCRINE: Negative for cold or heat intolerance, polyuria, polydipsia and goiter. NEURO: negative for tremor, gait imbalance, syncope and seizures. The remainder of the review of systems is noncontributory.   Physical Exam: BP 102/64 (BP Location: Left Arm, Patient Position: Sitting, Cuff Size: Normal)   Pulse (!) 117   Temp (!) 97.1 F (36.2 C) (Temporal)   Ht 6\' 2"  (1.88 m)   Wt 123 lb 9.6 oz (56.1 kg)   BMI 15.87 kg/m  GENERAL: The patient is AO x3, in no acute distress. Cachectic and severely malnourished. HEENT: Head is normocephalic and atraumatic. EOMI are intact. Mouth is well hydrated and without lesions. Has temporal wasting. NECK: Supple. No masses LUNGS: Clear to auscultation. No presence of rhonchi/wheezing/rales. Adequate chest expansion HEART: RRR, normal s1 and s2. ABDOMEN: Soft, nontender, no guarding, no peritoneal signs, and nondistended. BS +. No masses. EXTREMITIES: Without any cyanosis, clubbing, rash, lesions or edema. NEUROLOGIC: AOx3, no focal motor deficit. SKIN: no jaundice, no rashes   Imaging/Labs: as above  I personally reviewed and interpreted the available labs, imaging and endoscopic files.  Impression and Plan: KENLEY TROOP is a 72 y.o. male with past medical history of diabetes, GERD, hypertension, pancreatitis, peripheral vascular disease who presents for  evaluation of weight loss.  The patient has presented significant weight loss during the last year has been unintentional despite having an adequate oral intake.  He is actually presenting significant sarcopenia and clinical malnutrition.  I discussed with the patient that there are several factors that could be contributing to his unintentional weight loss.  My main concern at the moment is that malignancy is leading to his presentation.  Specifically my concern is regarding the possibility of renal cell carcinoma given the imaging abnormalities found on the CT scan last year, but also the possibility of pancreatic malignancy as he had presence of possible changes of chronic pancreatitis and pancreatic ductal dilation.  I had a very thorough discussion with the patient and his wife regarding the importance of proceeding with the MRI ordered  by his oncologist.  It was until the end of the clinical encounter that the told me he had canceled this imaging study.  Due to these, I will reorder an MRI of the abdomen with and without IV contrast which will help evaluate both his kidney and the hepatobiliary/pancreatic areas.     We also discussed that he is presenting significantly uncontrolled diabetes, which can also lead to weight loss and gastrointestinal complaints with dysmotility.  I emphasized the importance of achieving a tight glucose control -I think it is imperative to have an evaluation by endocrinologist to have adequate management of his diabetes.  Ultimately, he will also need to have further evaluation of his upper abdominal complaints with an EGD and evaluate the possibility of colonic malignancy with a colonoscopy given his ongoing weight loss.  I emphasized that it was important to have adequate control of his fingersticks before this can be scheduled, which potentially can be achieved in 1 month.  - Schedule EGD and colonoscopy in 4 weeks - Schedule MRI of the abdomen with and without IV  contrast - Urgent referral for endocrinology for management of severely uncontrolled diabetes - Try taking Ensure (sugar-free) 2-3 times a day  All questions were answered.      Oscar Blazing, MD Gastroenterology and Hepatology [facility]

## 2023-03-29 ENCOUNTER — Ambulatory Visit: Payer: Medicare HMO

## 2023-03-30 ENCOUNTER — Other Ambulatory Visit: Payer: Self-pay | Admitting: Cardiovascular Disease

## 2023-03-30 ENCOUNTER — Other Ambulatory Visit: Payer: Self-pay | Admitting: Family Medicine

## 2023-03-30 DIAGNOSIS — R110 Nausea: Secondary | ICD-10-CM

## 2023-04-01 NOTE — Telephone Encounter (Signed)
Unable to refill per protocol, Rx expired. Discontinued 03/14/23.  Requested Prescriptions  Pending Prescriptions Disp Refills   CREON 36000-114000 units CPEP capsule [Pharmacy Med Name: CREON 36,000UNIT CAPSULES] 240 capsule 11    Sig: TAKE 2 CAPSULES BY MOUTH THREE TIMES DAILY WITH MEALS, MAY ALSO TAKE 1 CAPSULE AS NEEDED WITH SNACKS     Off-Protocol Failed - 03/30/2023 10:13 AM      Failed - Medication not assigned to a protocol, review manually.      Passed - Valid encounter within last 12 months    Recent Outpatient Visits           2 weeks ago Annual physical exam   [facility] Merita Norton T, FNP   2 years ago Uncontrolled type 2 diabetes mellitus with peripheral circulatory disorder (HCC)   [facility] Donita Brooks, MD   3 years ago Uncontrolled type 2 diabetes mellitus with peripheral circulatory disorder (HCC)   [facility] Donita Brooks, MD   5 years ago PAD (peripheral artery disease) (HCC)   [facility] Dorena Bodo, PA-C   6 years ago Diabetes type II with atherosclerosis of arteries of extremities (HCC)   [facility] Pickard, Priscille Heidelberg, MD       Future Appointments             In 1 week Pardue, Monico Blitz, DO [facility]   In 2 weeks Dani Gobble, NP [facility]

## 2023-04-01 NOTE — Telephone Encounter (Signed)
Requested medications are due for refill today.  yes  Requested medications are on the active medications list.  yes  Last refill. 03/25/2023 #20 0 rf  Future visit scheduled.   yes  Notes to clinic.  Refill not delegated.    Requested Prescriptions  Pending Prescriptions Disp Refills   ondansetron (ZOFRAN-ODT) 4 MG disintegrating tablet [Pharmacy Med Name: ONDANSETRON ODT 4MG  TABLETS] 20 tablet 0    Sig: DISSOLVE 1 TABLET(4 MG) ON THE TONGUE EVERY 8 HOURS AS NEEDED FOR NAUSEA OR VOMITING     Not Delegated - Gastroenterology: Antiemetics - ondansetron Failed - 03/30/2023 10:13 AM      Failed - This refill cannot be delegated      Passed - AST in normal range and within 360 days    AST  Date Value Ref Range Status  03/14/2023 10 0 - 40 IU/L Final         Passed - ALT in normal range and within 360 days    ALT  Date Value Ref Range Status  03/14/2023 33 0 - 44 IU/L Final         Passed - Valid encounter within last 6 months    Recent Outpatient Visits           2 weeks ago Annual physical exam   [facility] Merita Norton T, FNP   2 years ago Uncontrolled type 2 diabetes mellitus with peripheral circulatory disorder (HCC)   [facility] Donita Brooks, MD   3 years ago Uncontrolled type 2 diabetes mellitus with peripheral circulatory disorder (HCC)   [facility] Donita Brooks, MD   5 years ago PAD (peripheral artery disease) (HCC)   [facility] Allayne Butcher B, PA-C   6 years ago Diabetes type II with atherosclerosis of arteries of extremities (HCC)   [facility] Pickard, Priscille Heidelberg, MD       Future Appointments             In 1 week Pardue, Monico Blitz, DO [facility], [facility]   In 2 weeks Dani Gobble, NP [facility]

## 2023-04-03 ENCOUNTER — Ambulatory Visit (HOSPITAL_COMMUNITY)
Admission: RE | Admit: 2023-04-03 | Discharge: 2023-04-03 | Disposition: A | Discharge status: Home / Self Care | Payer: Medicare HMO | Source: Ambulatory Visit | Attending: Gastroenterology | Admitting: Gastroenterology

## 2023-04-03 DIAGNOSIS — K8020 Calculus of gallbladder without cholecystitis without obstruction: Secondary | ICD-10-CM | POA: Diagnosis not present

## 2023-04-03 DIAGNOSIS — K8689 Other specified diseases of pancreas: Secondary | ICD-10-CM | POA: Diagnosis not present

## 2023-04-03 DIAGNOSIS — R634 Abnormal weight loss: Secondary | ICD-10-CM | POA: Diagnosis not present

## 2023-04-03 DIAGNOSIS — R933 Abnormal findings on diagnostic imaging of other parts of digestive tract: Secondary | ICD-10-CM | POA: Diagnosis not present

## 2023-04-03 DIAGNOSIS — N281 Cyst of kidney, acquired: Secondary | ICD-10-CM | POA: Diagnosis not present

## 2023-04-03 DIAGNOSIS — R93429 Abnormal radiologic findings on diagnostic imaging of unspecified kidney: Secondary | ICD-10-CM | POA: Insufficient documentation

## 2023-04-03 MED ORDER — GADOBUTROL 1 MMOL/ML IV SOLN
7.0000 mL | Freq: Once | INTRAVENOUS | Status: AC | PRN
  Administered 2023-04-03: 7 mL via INTRAVENOUS

## 2023-04-03 NOTE — Telephone Encounter (Signed)
Spoke with patient and his spouse. Pt's spouse states that they will call back at earliest convenience to schedule IN OFFICE preop clearance appt.

## 2023-04-05 NOTE — Telephone Encounter (Addendum)
I gave another call today to see if pt was available to schedule in office appt for pre op clearance, left message to call back.  I will update the requesting office that the pt is going to need an appt in office.

## 2023-04-08 DIAGNOSIS — H2511 Age-related nuclear cataract, right eye: Secondary | ICD-10-CM | POA: Diagnosis not present

## 2023-04-09 ENCOUNTER — Encounter: Payer: Self-pay | Admitting: Ophthalmology

## 2023-04-10 NOTE — Telephone Encounter (Signed)
Our office has tried multiple times to reach the pt to schedule an IN OFFICE APPT FOR PRE OP CLEARANCE. Have left a verbal message with pt's wife as well.   I will update the requesting office the pt needs in office appt fro pre op clearance. I will remove from the pre op call back pool at this time. Pt can let scheduler know he needs IN OFFICE APP FOR PRE OP.

## 2023-04-11 ENCOUNTER — Other Ambulatory Visit: Payer: Self-pay | Admitting: Family Medicine

## 2023-04-11 ENCOUNTER — Ambulatory Visit: Payer: Medicare HMO | Admitting: Family Medicine

## 2023-04-11 ENCOUNTER — Encounter: Payer: Self-pay | Admitting: Family Medicine

## 2023-04-11 VITALS — BP 113/58 | HR 74 | Ht 74.0 in | Wt 131.1 lb

## 2023-04-11 DIAGNOSIS — R634 Abnormal weight loss: Secondary | ICD-10-CM

## 2023-04-11 DIAGNOSIS — Z794 Long term (current) use of insulin: Secondary | ICD-10-CM

## 2023-04-11 DIAGNOSIS — F17200 Nicotine dependence, unspecified, uncomplicated: Secondary | ICD-10-CM | POA: Diagnosis not present

## 2023-04-11 DIAGNOSIS — E118 Type 2 diabetes mellitus with unspecified complications: Secondary | ICD-10-CM

## 2023-04-11 MED ORDER — FREESTYLE LIBRE 3 READER DEVI: 1.0000 | Freq: Once | 0 refills | Status: AC

## 2023-04-11 MED ORDER — FREESTYLE LIBRE 3 SENSOR MISC
1.0000 | 11 refills | Status: DC
Start: 2023-04-11 — End: 2023-04-19

## 2023-04-11 NOTE — Assessment & Plan Note (Addendum)
Continue insulin, increasing by 2 units q3d if fasting BG remains >150, as region with prescribed. Seeing endocrinology Alphonzo Lemmings Reardon) 04/17/2023 Prescribed freestyle libre 3 sensor as noted below.

## 2023-04-11 NOTE — Telephone Encounter (Signed)
Requested Prescriptions  Pending Prescriptions Disp Refills   mirtazapine (REMERON) 7.5 MG tablet [Pharmacy Med Name: MIRTAZAPINE 7.5MG  TABLETS] 90 tablet 0    Sig: TAKE 1 TABLET(7.5 MG) BY MOUTH AT BEDTIME     Psychiatry: Antidepressants - mirtazapine Passed - 04/11/2023  3:46 AM      Passed - Valid encounter within last 6 months    Recent Outpatient Visits           Today Type 2 diabetes mellitus with complication, without long-term current use of insulin [facility])   [facility] Pardue, Monico Blitz, DO   4 weeks ago Annual physical exam   [facility] Merita Norton T, FNP   2 years ago Uncontrolled type 2 diabetes mellitus with peripheral circulatory disorder (HCC)   [facility] Donita Brooks, MD   3 years ago Uncontrolled type 2 diabetes mellitus with peripheral circulatory disorder (HCC)   [facility] Donita Brooks, MD   5 years ago PAD (peripheral artery disease) (HCC)   [facility] Dorena Bodo, PA-C       Future Appointments             In 6 days Lurlean Leyden, Freddi Starr, NP [facility]   In 3 months Pardue, Monico Blitz, DO [facility]

## 2023-04-11 NOTE — Progress Notes (Signed)
Established patient visit   [name]   DOB: 07/21/1950   72 y.o. Male  MRN: [id number] Visit Date: 04/11/2023  Today's healthcare provider: Sherlyn Hay, DO   Chief Complaint  Patient presents with   Medical Management of Chronic Issues    1 month follow up. Symptoms: nausea every once and a while, excessive urinating, excessive thirst and mouth dry. Checking daily sugar. Yesterday was 217 mg/dl.    Subjective    HPI Hypertension, follow-up  BP Readings from Last 3 Encounters:  04/18/23 134/66  04/17/23 106/70  04/11/23 (!) 113/58   Wt Readings from Last 3 Encounters:  04/18/23 136 lb (61.7 kg)  04/17/23 137 lb 3.2 oz (62.2 kg)  04/11/23 131 lb 1.6 oz (59.5 kg)     He was last seen for hypertension 4 weeks ago.  BP at that visit was 141/72. Management since that visit includes started lisinopril 10 mg daily.  He reports excellent compliance with treatment. He is not having side effects.  He is following a Regular diet.  Apple sauce, pudding, hamburger steak. Is not eating as much as wife would prefer. He  sometimes  exercising. He walks a little He does smoke. <1/2 ppd; has been cutting down  Use of agents associated with hypertension:  nicotine .   Outside blood pressures are not checked. Symptoms: No chest pain No chest pressure  No palpitations No syncope  No dyspnea No orthopnea  No paroxysmal nocturnal dyspnea No lower extremity edema   Pertinent labs Lab Results  Component Value Date   CHOL 106 03/14/2023   HDL 45 03/14/2023   LDLCALC 37 03/14/2023   TRIG 138 03/14/2023   CHOLHDL 2.4 03/14/2023   Lab Results  Component Value Date   NA 132 (L) 03/16/2023   K 4.9 03/16/2023   CREATININE 0.71 03/16/2023   GFRNONAA >60 03/16/2023   GLUCOSE 223 (H) 03/16/2023   TSH 1.430 03/14/2023     The ASCVD Risk score (Arnett DK, et al., 2019) failed to calculate for the following reasons:   The valid total cholesterol range is 130 to  320 mg/dL  ---------------------------------------------------------------------------------------------------  Diabetes Mellitus Type II, Follow-up - Will see endocrinology soon  Lab Results  Component Value Date   HGBA1C >15.5 (H) 03/14/2023   HGBA1C 12.5 (H) 01/18/2022   HGBA1C 8.0 (H) 06/13/2020   Wt Readings from Last 3 Encounters:  04/18/23 136 lb (61.7 kg)  04/17/23 137 lb 3.2 oz (62.2 kg)  04/11/23 131 lb 1.6 oz (59.5 kg)   Last seen for diabetes 4 weeks ago.  Management since then includes started insulin. Has NOT been taking Januvia He reports excellent compliance with treatment. He is not having side effects.  Symptoms: Yes fatigue No foot ulcerations  Yes appetite changes Yes nausea  No paresthesia of the feet  Yes polydipsia  Yes polyuria No visual disturbances   No vomiting    Abdominal pain and nausea significantly improved. None in past 4 days.  Home blood sugar records: fasting range: 200s  - checks BG every two days  Episodes of hypoglycemia? No     Current insulin regiment: Currently at 28 units nightly; will be increasing to 30 tonight. Most Recent Eye Exam: Has been frequently to the eye doctor recently (has cataract surgery scheduled); they will request records. Current diet and exercise as noted above.  Pertinent Labs: Lab Results  Component Value Date   CHOL 106 03/14/2023   HDL  45 03/14/2023   LDLCALC 37 03/14/2023   TRIG 138 03/14/2023   CHOLHDL 2.4 03/14/2023   Lab Results  Component Value Date   NA 132 (L) 03/16/2023   K 4.9 03/16/2023   CREATININE 0.71 03/16/2023   GFRNONAA >60 03/16/2023   MICRALBCREAT 318 (H) 03/14/2023     ---------------------------------------------------------------------------------------------------  Weight loss: - dropped from 162 pounds 02/2021 to 123 pounds 03/28/2023  - Has been taking supplemental dietary drink several times daily with increase in weight by 8 pounds since gastroenterology visit  03/28/2023   Most information below obtained/summarized through record review:   - Saw Dr. Smith Robert 03/22/2023 -she had ordered MRI due to concerning findings on CT performed in 2023, but patient canceled it, stating he did not think he needed it.  Reordered by gastroenterology to help evaluate both his kidney and the hepatobiliary/pancreatic areas (concern for renal vs pancreatic cancer). Patient's wife reports he has had it done. - Pending EGD/colonoscopy at the end of the month (as long as BGs remain controlled)  - from note by Dr. Levon Hedger 03/28/2023: "Most recent available cross-sectional abdominal imaging on 01/19/2022 (CT abdomen and pelvis without contrast) showed changes suggestive of diarrheal illness as he had gas/fluid levels throughout the colon, a 6 cm right renal lesion which was suggestive of a renal cell carcinoma.  There was presence of a prominent retroperitoneal lymph nodes measuring 7 mm.  Presence of chronic pancreatitis with mild peripancreatic stranding.  Was also presence of dilation of the main pancreatic duct measuring up to 8 mm.  He was referred by his PCP (Dr. Tanya Nones) to undergo MRI of the abdomen and to start Creon tablets.  He is not currently taking Creon."      Medications: Outpatient Medications Prior to Visit  Medication Sig Note   acetaminophen (TYLENOL) 500 MG tablet Take 1,000 mg by mouth every 6 (six) hours as needed for moderate pain.    amLODipine (NORVASC) 10 MG tablet TAKE 1 TABLET(10 MG) BY MOUTH DAILY    Blood Glucose Monitoring Suppl DEVI 1 each by Does not apply route in the morning and at bedtime. May substitute to any manufacturer covered by patient's insurance.    clopidogrel (PLAVIX) 75 MG tablet Take 1 tablet (75 mg total) by mouth daily.    gabapentin (NEURONTIN) 300 MG capsule Take 300 mg by mouth in the morning and at bedtime.    Glucose Blood (BLOOD GLUCOSE TEST STRIPS) STRP 1 each by In Vitro route in the morning and at bedtime. May substitute to  any manufacturer covered by patient's insurance.    [EXPIRED] Lancet Device MISC 1 each by Does not apply route in the morning and at bedtime. May substitute to any manufacturer covered by patient's insurance.    [EXPIRED] Lancets Misc. MISC 1 each by Does not apply route in the morning and at bedtime. May substitute to any manufacturer covered by patient's insurance.    ondansetron (ZOFRAN-ODT) 4 MG disintegrating tablet DISSOLVE 1 TABLET(4 MG) ON THE TONGUE EVERY 8 HOURS AS NEEDED FOR NAUSEA OR VOMITING    pantoprazole (PROTONIX) 40 MG tablet Take 1 tablet (40 mg total) by mouth daily.    rosuvastatin (CRESTOR) 10 MG tablet TAKE 1 TABLET(10 MG) BY MOUTH DAILY    [DISCONTINUED] insulin glargine (LANTUS SOLOSTAR) 100 UNIT/ML Solostar Pen Inject 50 Units into the skin at bedtime. Check fasting blood sugar every morning; titrate dose by 2 units every 3 days to meet fasting blood sugar goal of <150. Start with  20 units and titrate to 50 units (as estimated max dose) (Patient taking differently: Inject 50 Units into the skin at bedtime. Check fasting blood sugar every morning; titrate dose by 2 units every 3 days to meet fasting  blood sugar goal of <150. Start with 20 units and titrate to 50 units (as estimated max dose)) 04/11/2023: Currently at 28 units qhs   [DISCONTINUED] Insulin Pen Needle (PEN NEEDLES) 31G X 5 MM MISC 1 each by Does not apply route at bedtime.    [DISCONTINUED] mirtazapine (REMERON) 7.5 MG tablet Take 1 tablet (7.5 mg total) by mouth at bedtime.    [DISCONTINUED] sitaGLIPtin (JANUVIA) 100 MG tablet Take 1 tablet (100 mg total) by mouth daily. (Patient not taking: Reported on 04/11/2023)    No facility-administered medications prior to visit.    Review of Systems  Constitutional:  Negative for chills, fever and unexpected weight change (increased 8 lbs).  Respiratory: Negative.  Negative for cough, shortness of breath and wheezing.   Cardiovascular:  Negative for chest pain,  palpitations and leg swelling.  Gastrointestinal:  Negative for abdominal pain, constipation, diarrhea and nausea.  Endocrine: Positive for polydipsia and polyuria. Negative for polyphagia.  Neurological:  Negative for weakness and headaches.        Objective    BP (!) 113/58 (BP Location: Right Arm, Patient Position: Sitting, Cuff Size: Normal)   Pulse 74   Ht 6\' 2"  (1.88 m)   Wt 131 lb 1.6 oz (59.5 kg)   SpO2 100%   BMI 16.83 kg/m     Physical Exam Vitals and nursing note reviewed.  Constitutional:      General: He is not in acute distress.    Appearance: Normal appearance.  HENT:     Head: Normocephalic and atraumatic.  Eyes:     General: No scleral icterus.    Conjunctiva/sclera: Conjunctivae normal.  Cardiovascular:     Rate and Rhythm: Normal rate.  Pulmonary:     Effort: Pulmonary effort is normal.  Neurological:     Mental Status: He is alert and oriented to person, place, and time. Mental status is at baseline.  Psychiatric:        Mood and Affect: Mood normal.        Behavior: Behavior normal.      No results found for any visits on 04/11/23.  Assessment & Plan    Type 2 diabetes mellitus with complication, without long-term current use of insulin (HCC) Assessment & Plan: Continue insulin, increasing by 2 units q3d if fasting BG remains >150, as region with prescribed. Seeing endocrinology Alphonzo Lemmings Reardon) 04/17/2023 Prescribed freestyle libre 3 sensor as noted below.  Orders: -     FreeStyle Libre 3 Sensor; 1 each by Does not apply route every 14 (fourteen) days. Place 1 sensor on the skin every 14 days. Use to check glucose continuously (Patient not taking: Reported on 04/17/2023)  Dispense: 2 each; Refill: 11 -     FreeStyle Libre 3 Reader; 1 each by Does not apply route once for 1 dose.  Dispense: 1 each; Refill: 0  Nicotine dependence with current use Assessment & Plan: Patient continues to try to taper his cigarette use.  He is not interested  in smoking cessation medication options at this time    Return in about 3 months (around 07/12/2023) for HTN.      I discussed the assessment and treatment plan with the patient  The patient was provided an opportunity to ask questions and  all were answered. The patient agreed with the plan and demonstrated an understanding of the instructions.   The patient was advised to call back or seek an in-person evaluation if the symptoms worsen or if the condition fails to improve as anticipated.  Total time was 40 minutes. That includes chart review before the visit, the actual patient visit, and time spent on documentation after the visit.    Sherlyn Hay, DO  [facility] [phone] (phone) [phone] (fax)  [facility]

## 2023-04-16 ENCOUNTER — Ambulatory Visit: Payer: Self-pay | Admitting: Family Medicine

## 2023-04-16 NOTE — Progress Notes (Unsigned)
Endocrinology Consult Note       04/17/2023, 9:45 AM   Subjective:    Patient ID: Oscar Mills, male    DOB: [birth year].  Oscar Mills is being seen in consultation for management of currently uncontrolled symptomatic diabetes requested by  Sherlyn Hay, DO.   Past Medical History:  Diagnosis Date   Arthritis    NECK   Diabetes mellitus, type II (HCC)    Dysfunctional alcohol use    now quit   GERD (gastroesophageal reflux disease)    Hypertension    Neuropathy    feet   Pancreatitis    Peripheral vascular disease (HCC)    Stress fracture of ankle     Past Surgical History:  Procedure Laterality Date   ABDOMINAL AORTOGRAM W/LOWER EXTREMITY Bilateral 10/28/2019   Procedure: ABDOMINAL AORTOGRAM W/LOWER EXTREMITY;  Surgeon: Iran Ouch, MD;  Location: [facility];  Service: Cardiovascular;  Laterality: Bilateral;   ABDOMINAL AORTOGRAM W/LOWER EXTREMITY Bilateral 06/29/2020   Procedure: ABDOMINAL AORTOGRAM W/LOWER EXTREMITY;  Surgeon: Iran Ouch, MD;  Location: [facility];  Service: Cardiovascular;  Laterality: Bilateral;   CATARACT EXTRACTION W/PHACO Left 09/03/2019   Procedure: CATARACT EXTRACTION PHACO AND INTRAOCULAR LENS PLACEMENT (IOC) LEFT VISION BLUE 9.84 00:54.9 17.9%;  Surgeon: Elliot Cousin, MD;  Location: [facility];  Service: Ophthalmology;  Laterality: Left;  Diabetic - diet controlled   LOWER EXTREMITY ANGIOGRAM Bilateral 02/24/2014   Procedure: LOWER EXTREMITY ANGIOGRAM;  Surgeon: Iran Ouch, MD;  Location: [facility];  Service: Cardiovascular;  Laterality: Bilateral;   PERIPHERAL VASCULAR CATHETERIZATION     PERIPHERAL VASCULAR INTERVENTION  10/28/2019   Procedure: PERIPHERAL VASCULAR INTERVENTION;  Surgeon: Iran Ouch, MD;  Location: [facility];  Service: Cardiovascular;;   PERIPHERAL VASCULAR INTERVENTION  06/29/2020    Procedure: PERIPHERAL VASCULAR INTERVENTION;  Surgeon: Iran Ouch, MD;  Location: [facility];  Service: Cardiovascular;;   UMBILICAL HERNIA REPAIR N/A 03/05/2017   Procedure: HERNIA REPAIR UMBILICAL ADULT;  Surgeon: Nadeen Landau, MD;  Location: [facility];  Service: General;  Laterality: N/A;    Social History   Socioeconomic History   Marital status: Married    Spouse name: Not on file   Number of children: 2   Years of education: 12   Highest education level: 11th grade  Occupational History   Occupation: self-employed  Tobacco Use   Smoking status: Every Day    Current packs/day: 0.50    Average packs/day: 0.5 packs/day for 40.0 years (20.0 ttl pk-yrs)    Types: Cigarettes   Smokeless tobacco: Never  Vaping Use   Vaping status: Never Used  Substance and Sexual Activity   Alcohol use: Not Currently    Comment: quit around 2012   Drug use: No   Sexual activity: Not on file  Other Topics Concern   Not on file  Social History Narrative   Not on file   Social Determinants of Health   Financial Resource Strain: Low Risk  (03/13/2023)   Overall Financial Resource Strain (CARDIA)    Difficulty of Paying Living Expenses: Not very hard  Food Insecurity: No Food  Insecurity (04/07/2023)   Hunger Vital Sign    Worried About Running Out of Food in the Last Year: Never true    Ran Out of Food in the Last Year: Never true  Transportation Needs: No Transportation Needs (04/07/2023)   PRAPARE - Administrator, Civil Service (Medical): No    Lack of Transportation (Non-Medical): No  Physical Activity: Insufficiently Active (04/07/2023)   Exercise Vital Sign    Days of Exercise per Week: 1 day    Minutes of Exercise per Session: 70 min  Stress: No Stress Concern Present (04/07/2023)   [employer] of Occupational Health - Occupational Stress Questionnaire    Feeling of Stress : Not at all  Recent Concern: Stress - Stress Concern Present  (03/13/2023)   [employer] of Occupational Health - Occupational Stress Questionnaire    Feeling of Stress : To some extent  Social Connections: Unknown (04/07/2023)   Social Connection and Isolation Panel [NHANES]    Frequency of Communication with Friends and Family: Twice a week    Frequency of Social Gatherings with Friends and Family: Once a week    Attends Religious Services: Patient declined    Database administrator or Organizations: No    Attends Engineer, structural: Never    Marital Status: Married  Recent Concern: Social Connections - Moderately Isolated (03/13/2023)   Social Connection and Isolation Panel [NHANES]    Frequency of Communication with Friends and Family: Twice a week    Frequency of Social Gatherings with Friends and Family: Once a week    Attends Religious Services: Never    Database administrator or Organizations: No    Attends Engineer, structural: Never    Marital Status: Married    Family History  Problem Relation Age of Onset   Heart attack Father     Outpatient Encounter Medications as of 04/17/2023  Medication Sig   acetaminophen (TYLENOL) 500 MG tablet Take 1,000 mg by mouth every 6 (six) hours as needed for moderate pain.   amLODipine (NORVASC) 10 MG tablet TAKE 1 TABLET(10 MG) BY MOUTH DAILY   Blood Glucose Monitoring Suppl DEVI 1 each by Does not apply route in the morning and at bedtime. May substitute to any manufacturer covered by patient's insurance.   clopidogrel (PLAVIX) 75 MG tablet Take 1 tablet (75 mg total) by mouth daily.   gabapentin (NEURONTIN) 300 MG capsule Take 300 mg by mouth in the morning and at bedtime.   Glucose Blood (BLOOD GLUCOSE TEST STRIPS) STRP 1 each by In Vitro route in the morning and at bedtime. May substitute to any manufacturer covered by patient's insurance.   mirtazapine (REMERON) 7.5 MG tablet TAKE 1 TABLET(7.5 MG) BY MOUTH AT BEDTIME   ondansetron (ZOFRAN-ODT) 4 MG disintegrating  tablet DISSOLVE 1 TABLET(4 MG) ON THE TONGUE EVERY 8 HOURS AS NEEDED FOR NAUSEA OR VOMITING   pantoprazole (PROTONIX) 40 MG tablet Take 1 tablet (40 mg total) by mouth daily.   rosuvastatin (CRESTOR) 10 MG tablet TAKE 1 TABLET(10 MG) BY MOUTH DAILY   [DISCONTINUED] insulin glargine (LANTUS SOLOSTAR) 100 UNIT/ML Solostar Pen Inject 50 Units into the skin at bedtime. Check fasting blood sugar every morning; titrate dose by 2 units every 3 days to meet fasting blood sugar goal of <150. Start with 20 units and titrate to 50 units (as estimated max dose) (Patient taking differently: Inject 50 Units into the skin at bedtime. Check fasting blood sugar every morning;  titrate dose by 2 units every 3 days to meet fasting  blood sugar goal of <150. Start with 20 units and titrate to 50 units (as estimated max dose))   [DISCONTINUED] Insulin Pen Needle (PEN NEEDLES) 31G X 5 MM MISC 1 each by Does not apply route at bedtime.   Continuous Glucose Receiver (FREESTYLE LIBRE 3 READER) DEVI 1 each by Does not apply route once for 1 dose.   Continuous Glucose Sensor (FREESTYLE LIBRE 3 SENSOR) MISC 1 each by Does not apply route every 14 (fourteen) days. Place 1 sensor on the skin every 14 days. Use to check glucose continuously (Patient not taking: Reported on 04/17/2023)   insulin glargine (LANTUS SOLOSTAR) 100 UNIT/ML Solostar Pen Inject 30 Units into the skin at bedtime. Check fasting blood sugar every morning; titrate dose by 2 units every 3 days to meet fasting blood sugar goal of <150. Start with 20 units and titrate to 50 units (as estimated max dose)   Insulin Pen Needle (PEN NEEDLES) 31G X 5 MM MISC Use to inject insulin once daily   No facility-administered encounter medications on file as of 04/17/2023.    ALLERGIES: Allergies  Allergen Reactions   Iohexol      Code: HIVES, Desc: HIVES WITH OMNIPAQUE 300 OBSERVED BY DR Gery Pray NO MEDS GIVEN     VACCINATION STATUS:  There is no immunization history on  file for this patient.  Diabetes He presents for his initial diabetic visit. He has type 2 diabetes mellitus. Onset time: diagnosed at approx age of 36. His disease course has been fluctuating. There are no hypoglycemic associated symptoms. Associated symptoms include blurred vision, fatigue, foot paresthesias, polydipsia, polyuria and weakness. There are no hypoglycemic complications. Symptoms are stable. Diabetic complications include heart disease (has had several stents) and peripheral neuropathy. (Chronic pancreatitis) Risk factors for coronary artery disease include diabetes mellitus, family history, male sex, hypertension, stress and tobacco exposure. Current diabetic treatment includes insulin injections. He is compliant with treatment most of the time. His weight is increasing steadily. He is following a generally unhealthy diet. When asked about meal planning, he reported none. He has not had a previous visit with a dietitian. He participates in exercise intermittently. (He presents today, accompanied by his spouse, for his consultation with his logs showing fluctuating glycemic profile (values ranging between 84-366.  His most recent A1c was > 15.5% on 9/19.  He does note he gets steroid injections in his back intermittently.  He drinks black coffee, water, gatorade, energy drinks, diet soda, diet tea, milk and eats 2-3 meals per day with snacks between.  He engages in routine physical activity by walking.  He is UTD on eye exam, sees podiatry.  He is scheduled for cataract sx tomorrow.) An ACE inhibitor/angiotensin II receptor blocker is not being taken. He sees a podiatrist.Eye exam is current.     Review of systems  Constitutional: + Minimally fluctuating body weight, current Body mass index is 17.62 kg/m., no fatigue, no subjective hyperthermia, no subjective hypothermia Eyes: no blurry vision, no xerophthalmia ENT: no sore throat, no nodules palpated in throat, no dysphagia/odynophagia,  no hoarseness Cardiovascular: no chest pain, no shortness of breath, no palpitations, no leg swelling Respiratory: no cough, no shortness of breath Gastrointestinal: no nausea/vomiting/diarrhea Musculoskeletal: no muscle/joint aches Skin: no rashes, no hyperemia Neurological: no tremors, no numbness, no tingling, no dizziness Psychiatric: no depression, no anxiety  Objective:     BP 106/70 (BP Location: Right Arm, Patient Position: Sitting,  Cuff Size: Large)   Pulse 75   Ht 6\' 2"  (1.88 m)   Wt 137 lb 3.2 oz (62.2 kg)   BMI 17.62 kg/m   Wt Readings from Last 3 Encounters:  04/17/23 137 lb 3.2 oz (62.2 kg)  04/11/23 131 lb 1.6 oz (59.5 kg)  03/28/23 123 lb 9.6 oz (56.1 kg)     BP Readings from Last 3 Encounters:  04/17/23 106/70  04/11/23 (!) 113/58  03/28/23 102/64     Physical Exam- Limited  Constitutional:  Body mass index is 17.62 kg/m. , not in acute distress, normal state of mind Eyes:  EOMI, no exophthalmos Neck: Supple Cardiovascular: RRR, no murmurs, rubs, or gallops, no edema Respiratory: Adequate breathing efforts, no crackles, rales, rhonchi, or wheezing Musculoskeletal: no gross deformities, strength intact in all four extremities, no gross restriction of joint movements Skin:  no rashes, no hyperemia Neurological: no tremor with outstretched hands   Diabetic Foot Exam - Simple   No data filed      CMP ( most recent) CMP     Component Value Date/Time   NA 132 (L) 03/16/2023 1500   NA 131 (L) 03/14/2023 1458   K 4.9 03/16/2023 1500   CL 104 03/16/2023 1500   CO2 16 (L) 03/16/2023 1500   GLUCOSE 223 (H) 03/16/2023 1500   BUN 14 03/16/2023 1500   BUN 18 03/14/2023 1458   CREATININE 0.71 03/16/2023 1500   CREATININE 0.76 01/18/2022 1121   CALCIUM 9.0 03/16/2023 1500   PROT 6.5 03/14/2023 1458   ALBUMIN 4.1 03/14/2023 1458   AST 10 03/14/2023 1458   ALT 33 03/14/2023 1458   ALKPHOS 120 03/14/2023 1458   BILITOT 0.3 03/14/2023 1458   EGFR 95  03/14/2023 1458   GFRNONAA >60 03/16/2023 1500   GFRNONAA 97 06/13/2020 1217     Diabetic Labs (most recent): Lab Results  Component Value Date   HGBA1C >15.5 (H) 03/14/2023   HGBA1C 12.5 (H) 01/18/2022   HGBA1C 8.0 (H) 06/13/2020   MICROALBUR CANCELED 01/18/2022   MICROALBUR 3.4 06/13/2020     Lipid Panel ( most recent) Lipid Panel     Component Value Date/Time   CHOL 106 03/14/2023 1458   TRIG 138 03/14/2023 1458   HDL 45 03/14/2023 1458   CHOLHDL 2.4 03/14/2023 1458   CHOLHDL 3.6 01/18/2022 1121   VLDL 15 10/22/2019 1726   LDLCALC 37 03/14/2023 1458   LDLCALC 78 01/18/2022 1121   LABVLDL 24 03/14/2023 1458      Lab Results  Component Value Date   TSH 1.430 03/14/2023   TSH 1.143 10/15/2012           Assessment & Plan:   1) Type 2 diabetes mellitus with hyperglycemia, with long-term current use of insulin (HCC)  He presents today, accompanied by his spouse, for his consultation with his logs showing fluctuating glycemic profile (values ranging between 84-366.  His most recent A1c was > 15.5% on 9/19.  He does note he gets steroid injections in his back intermittently.  He drinks black coffee, water, gatorade, energy drinks, diet soda, diet tea, milk and eats 2-3 meals per day with snacks between.  He engages in routine physical activity by walking.  He is UTD on eye exam, sees podiatry.  He is scheduled for cataract sx tomorrow.  - SYMON STROHMEYER has currently uncontrolled symptomatic type 2 DM since 72 years of age, with most recent A1c of >15.5 %.   -Recent labs reviewed.  -  I had a long discussion with him about the progressive nature of diabetes and the pathology behind its complications. -his diabetes is complicated by chronic pancreatitis, PVD and he remains at a high risk for more acute and chronic complications which include CAD, CVA, CKD, retinopathy, and neuropathy. These are all discussed in detail with him.  The following Lifestyle Medicine  recommendations according to American College of Lifestyle Medicine [facility]) were discussed and offered to patient and he agrees to start the journey:  A. Whole Foods, Plant-based plate comprising of fruits and vegetables, plant-based proteins, whole-grain carbohydrates was discussed in detail with the patient.   A list for source of those nutrients were also provided to the patient.  Patient will use only water or unsweetened tea for hydration. B.  The need to stay away from risky substances including alcohol, smoking; obtaining 7 to 9 hours of restorative sleep, at least 150 minutes of moderate intensity exercise weekly, the importance of healthy social connections,  and stress reduction techniques were discussed. C.  A full color page of  Calorie density of various food groups per pound showing examples of each food groups was provided to the patient.  - I have counseled him on diet and weight management by adopting a carbohydrate restricted/protein rich diet. Patient is encouraged to switch to unprocessed or minimally processed complex starch and increased protein intake (animal or plant source), fruits, and vegetables. -  he is advised to stick to a routine mealtimes to eat 3 meals a day and avoid unnecessary snacks (to snack only to correct hypoglycemia).   - he acknowledges that there is a room for improvement in his food and drink choices. - Suggestion is made for him to avoid simple carbohydrates from his diet including Cakes, Sweet Desserts, Ice Cream, Soda (diet and regular), Sweet Tea, Candies, Chips, Cookies, Store Bought Juices, Alcohol in Excess of 1-2 drinks a day, Artificial Sweeteners, Coffee Creamer, and "Sugar-free" Products. This will help patient to have more stable blood glucose profile and potentially avoid unintended weight gain.  - he will be scheduled with Norm Salt, RDN, CDE for diabetes education.  - I have approached him with the following individualized plan to manage  his diabetes and patient agrees:   -He is advised to continue Lantus 30 units SQ nightly.  May consider adding prandial insulin at next visit if A1c has not improved.  I suspect he will need this due to chronic pancreatitis, preventing his pancreas from producing enough insulin.  -he is encouraged to start monitoring glucose 2 times daily, before breakfast and before bed, to log their readings on the clinic sheets provided, and bring them to review at follow up appointment in 3 months.  - he is warned not to take insulin without proper monitoring per orders. - Adjustment parameters are given to him for hypo and hyperglycemia in writing. - he is encouraged to call clinic for blood glucose levels less than 70 or above 300 mg /dl.  - he is not an ideal candidate for incretin therapy due to chronic pancreatitis and body habitus of BMI less than 25..  - Specific targets for  A1c; LDL, HDL, and Triglycerides were discussed with the patient.  2) Blood Pressure /Hypertension:  his blood pressure is controlled to target.   he is advised to continue his current medications including Amlodipine 10 mg p.o. daily with breakfast.  3) Lipids/Hyperlipidemia:    Review of his recent lipid panel from 03/14/23 showed controlled LDL at 37 .  he is advised to continue Crestor 10 mg daily at bedtime.  Side effects and precautions discussed with him.  4)  Weight/Diet:  his Body mass index is 17.62 kg/m.  -  he is NOT a candidate for weight loss.  Exercise, and detailed carbohydrates information provided  -  detailed on discharge instructions.  5) Chronic Care/Health Maintenance: -he is not on ACEI/ARB and is on Statin medications and is encouraged to initiate and continue to follow up with Ophthalmology, Dentist, Podiatrist at least yearly or according to recommendations, and advised to QUIT SMOKING. I have recommended yearly flu vaccine and pneumonia vaccine at least every 5 years; moderate intensity exercise for  up to 150 minutes weekly; and sleep for at least 7 hours a day.  - he is advised to maintain close follow up with Sherlyn Hay, DO for primary care needs, as well as his other providers for optimal and coordinated care.   - Time spent in this patient care: 60 min, of which > 50% was spent in counseling him about his diabetes and the rest reviewing his blood glucose logs, discussing his hypoglycemia and hyperglycemia episodes, reviewing his current and previous labs/studies (including abstraction from other facilities) and medications doses and developing a long term treatment plan based on the latest standards of care/guidelines; and documenting his care.    Please refer to Patient Instructions for Blood Glucose Monitoring and Insulin/Medications Dosing Guide" in media tab for additional information. Please also refer to "Patient Self Inventory" in the Media tab for reviewed elements of pertinent patient history.  Rob Bunting participated in the discussions, expressed understanding, and voiced agreement with the above plans.  All questions were answered to his satisfaction. he is encouraged to contact clinic should he have any questions or concerns prior to his return visit.     Follow up plan: - Return in about 3 months (around 07/18/2023) for Diabetes F/U with A1c in office, No previsit labs, Bring meter and logs.    Ronny Bacon, [facility] [address] [place], [place] [address] Phone: [phone] Fax: [phone]  04/17/2023, 9:45 AM

## 2023-04-16 NOTE — Discharge Instructions (Signed)

## 2023-04-16 NOTE — Patient Instructions (Signed)

## 2023-04-17 ENCOUNTER — Encounter: Payer: Self-pay | Admitting: Nurse Practitioner

## 2023-04-17 ENCOUNTER — Ambulatory Visit: Payer: Medicare HMO | Admitting: Nurse Practitioner

## 2023-04-17 VITALS — BP 106/70 | HR 75 | Ht 74.0 in | Wt 137.2 lb

## 2023-04-17 DIAGNOSIS — Z794 Long term (current) use of insulin: Secondary | ICD-10-CM | POA: Diagnosis not present

## 2023-04-17 DIAGNOSIS — I10 Essential (primary) hypertension: Secondary | ICD-10-CM | POA: Diagnosis not present

## 2023-04-17 DIAGNOSIS — E1165 Type 2 diabetes mellitus with hyperglycemia: Secondary | ICD-10-CM

## 2023-04-17 MED ORDER — PEN NEEDLES 31G X 5 MM MISC: 3 refills | Status: DC

## 2023-04-17 MED ORDER — LANTUS SOLOSTAR 100 UNIT/ML ~~LOC~~ SOPN
30.0000 [IU] | PEN_INJECTOR | Freq: Every day | SUBCUTANEOUS | 3 refills | Status: DC
Start: 2023-04-17 — End: 2023-07-18

## 2023-04-18 ENCOUNTER — Telehealth: Payer: Self-pay | Admitting: Family Medicine

## 2023-04-18 ENCOUNTER — Other Ambulatory Visit: Payer: Self-pay

## 2023-04-18 ENCOUNTER — Encounter: Payer: Self-pay | Admitting: Ophthalmology

## 2023-04-18 ENCOUNTER — Encounter
Admission: RE | Disposition: A | Discharge status: Home / Self Care | Payer: Self-pay | Source: Home / Self Care | Attending: Ophthalmology

## 2023-04-18 ENCOUNTER — Encounter: Payer: Self-pay | Admitting: Family Medicine

## 2023-04-18 ENCOUNTER — Other Ambulatory Visit: Payer: Self-pay | Admitting: Family Medicine

## 2023-04-18 ENCOUNTER — Ambulatory Visit: Payer: Medicare HMO | Admitting: Anesthesiology

## 2023-04-18 ENCOUNTER — Ambulatory Visit
Admission: RE | Admit: 2023-04-18 | Discharge: 2023-04-18 | Disposition: A | Discharge status: Home / Self Care | Payer: Medicare HMO | Attending: Ophthalmology | Admitting: Ophthalmology

## 2023-04-18 DIAGNOSIS — E785 Hyperlipidemia, unspecified: Secondary | ICD-10-CM | POA: Diagnosis not present

## 2023-04-18 DIAGNOSIS — E1136 Type 2 diabetes mellitus with diabetic cataract: Secondary | ICD-10-CM | POA: Insufficient documentation

## 2023-04-18 DIAGNOSIS — M6284 Sarcopenia: Secondary | ICD-10-CM | POA: Insufficient documentation

## 2023-04-18 DIAGNOSIS — E1140 Type 2 diabetes mellitus with diabetic neuropathy, unspecified: Secondary | ICD-10-CM | POA: Diagnosis not present

## 2023-04-18 DIAGNOSIS — Z794 Long term (current) use of insulin: Secondary | ICD-10-CM | POA: Diagnosis not present

## 2023-04-18 DIAGNOSIS — H2511 Age-related nuclear cataract, right eye: Secondary | ICD-10-CM | POA: Insufficient documentation

## 2023-04-18 DIAGNOSIS — K219 Gastro-esophageal reflux disease without esophagitis: Secondary | ICD-10-CM | POA: Insufficient documentation

## 2023-04-18 DIAGNOSIS — I10 Essential (primary) hypertension: Secondary | ICD-10-CM | POA: Insufficient documentation

## 2023-04-18 DIAGNOSIS — E1165 Type 2 diabetes mellitus with hyperglycemia: Secondary | ICD-10-CM | POA: Diagnosis not present

## 2023-04-18 DIAGNOSIS — E1151 Type 2 diabetes mellitus with diabetic peripheral angiopathy without gangrene: Secondary | ICD-10-CM | POA: Insufficient documentation

## 2023-04-18 DIAGNOSIS — F17210 Nicotine dependence, cigarettes, uncomplicated: Secondary | ICD-10-CM | POA: Diagnosis not present

## 2023-04-18 HISTORY — PX: CATARACT EXTRACTION W/PHACO: SHX586

## 2023-04-18 LAB — GLUCOSE, CAPILLARY
Glucose-Capillary: 76 mg/dL (ref 70–99)
Glucose-Capillary: 101 mg/dL — ABNORMAL HIGH (ref 70–99)

## 2023-04-18 SURGERY — PHACOEMULSIFICATION, CATARACT, WITH IOL INSERTION
Anesthesia: Monitor Anesthesia Care | Laterality: Right

## 2023-04-18 MED ORDER — MOXIFLOXACIN HCL 0.5 % OP SOLN
OPHTHALMIC | Status: DC | PRN
  Administered 2023-04-18: .2 mL via OPHTHALMIC

## 2023-04-18 MED ORDER — ARMC OPHTHALMIC DILATING DROPS
1.0000 | OPHTHALMIC | Status: DC | PRN
  Administered 2023-04-18 (×3): 1 via OPHTHALMIC

## 2023-04-18 MED ORDER — SIGHTPATH DOSE#1 BSS IO SOLN
INTRAOCULAR | Status: DC | PRN
  Administered 2023-04-18: 15 mL via INTRAOCULAR

## 2023-04-18 MED ORDER — MIDAZOLAM HCL 2 MG/2ML IJ SOLN
INTRAMUSCULAR | Status: AC
  Filled 2023-04-18: qty 2

## 2023-04-18 MED ORDER — TETRACAINE HCL 0.5 % OP SOLN
OPHTHALMIC | Status: AC
  Filled 2023-04-18: qty 4

## 2023-04-18 MED ORDER — LIDOCAINE HCL (PF) 2 % IJ SOLN
INTRAOCULAR | Status: DC | PRN
  Administered 2023-04-18: 1 mL via INTRAOCULAR

## 2023-04-18 MED ORDER — DEXTROSE 50 % IV SOLN
INTRAVENOUS | Status: AC
  Filled 2023-04-18: qty 50

## 2023-04-18 MED ORDER — BRIMONIDINE TARTRATE-TIMOLOL 0.2-0.5 % OP SOLN
OPHTHALMIC | Status: DC | PRN
  Administered 2023-04-18: 1 [drp] via OPHTHALMIC

## 2023-04-18 MED ORDER — SIGHTPATH DOSE#1 NA HYALUR & NA CHOND-NA HYALUR IO KIT
PACK | INTRAOCULAR | Status: DC | PRN
  Administered 2023-04-18: 1 via OPHTHALMIC

## 2023-04-18 MED ORDER — SIGHTPATH DOSE#1 BSS IO SOLN
INTRAOCULAR | Status: DC | PRN
  Administered 2023-04-18: 76 mL via OPHTHALMIC

## 2023-04-18 MED ORDER — TETRACAINE HCL 0.5 % OP SOLN
1.0000 [drp] | OPHTHALMIC | Status: DC | PRN
  Administered 2023-04-18 (×3): 1 [drp] via OPHTHALMIC

## 2023-04-18 MED ORDER — FENTANYL CITRATE (PF) 100 MCG/2ML IJ SOLN
INTRAMUSCULAR | Status: AC
  Filled 2023-04-18: qty 2

## 2023-04-18 MED ORDER — MIDAZOLAM HCL 2 MG/2ML IJ SOLN
INTRAMUSCULAR | Status: DC | PRN
  Administered 2023-04-18: 2 mg via INTRAVENOUS

## 2023-04-18 MED ORDER — ARMC OPHTHALMIC DILATING DROPS
OPHTHALMIC | Status: AC
  Filled 2023-04-18: qty 0.5

## 2023-04-18 MED ORDER — DEXTROSE 50 % IV SOLN
25.0000 mL | Freq: Once | INTRAVENOUS | Status: AC
  Administered 2023-04-18: 25 mL via INTRAVENOUS

## 2023-04-18 SURGICAL SUPPLY — 10 items
CATARACT SUITE SIGHTPATH (MISCELLANEOUS) ×1 IMPLANT
DISSECTOR HYDRO NUCLEUS 50X22 (MISCELLANEOUS) ×1 IMPLANT
DRSG TEGADERM 2-3/8X2-3/4 SM (GAUZE/BANDAGES/DRESSINGS) ×1 IMPLANT
FEE CATARACT SUITE SIGHTPATH (MISCELLANEOUS) ×1 IMPLANT
GLOVE SURG SYN 7.5 E (GLOVE) ×1
GLOVE SURG SYN 7.5 PF PI (GLOVE) ×1 IMPLANT
GLOVE SURG SYN 8.5 E (GLOVE) ×1
GLOVE SURG SYN 8.5 PF PI (GLOVE) ×1 IMPLANT
LENS IOL RAYNER 20.0 (Intraocular Lens) ×1 IMPLANT
LENS IOL RAYONE EMV 20.0 (Intraocular Lens) IMPLANT

## 2023-04-18 NOTE — Transfer of Care (Signed)
Immediate Anesthesia Transfer of Care Note  [name]  Procedure(s) Performed: CATARACT EXTRACTION PHACO AND INTRAOCULAR LENS PLACEMENT (IOC) RIGHT DIABETIC  RAYNER LENS TRIAL 11.80 01:50.0 (Right)  Patient Location: PACU  Anesthesia Type: MAC  Level of Consciousness: awake, alert  and patient cooperative  Airway and Oxygen Therapy: Patient Spontanous Breathing and Patient connected to supplemental oxygen  Post-op Assessment: Post-op Vital signs reviewed, Patient's Cardiovascular Status Stable, Respiratory Function Stable, Patent Airway and No signs of Nausea or vomiting  Post-op Vital Signs: Reviewed and stable  Complications: No notable events documented.

## 2023-04-18 NOTE — Telephone Encounter (Signed)
[facility] pharmacy is requesting prescription refill Continuous Glucose Sensor (FREESTYLE LIBRE 3 SENSOR) MISC   Please advise

## 2023-04-18 NOTE — Assessment & Plan Note (Signed)
Patient continues to try to taper his cigarette use.  He is not interested in smoking cessation medication options at this time

## 2023-04-18 NOTE — Op Note (Signed)
OPERATIVE NOTE  JAWAAN LANFEAR [id number] 04/18/2023   PREOPERATIVE DIAGNOSIS: Nuclear sclerotic cataract right eye. H25.11   POSTOPERATIVE DIAGNOSIS: Nuclear sclerotic cataract right eye. H25.11   PROCEDURE:  Phacoemusification with posterior chamber intraocular lens placement of the right eye  Ultrasound time: Procedure(s): CATARACT EXTRACTION PHACO AND INTRAOCULAR LENS PLACEMENT (IOC) RIGHT DIABETIC  RAYNER LENS TRIAL 11.80 01:50.0 (Right)  LENS:   Implant Name Type Inv. Item Serial No. Manufacturer Lot No. LRB No. Used Action  LENS IOL RAYNER 20.0 - [id number] Intraocular Lens LENS IOL RAYNER 20.0  [employer] [id number] Right 1 Implanted      SURGEON:  Julious Payer. Rolley Sims, MD   ANESTHESIA:  Topical with tetracaine drops, augmented with 1% preservative-free intracameral lidocaine.   COMPLICATIONS:  None.   DESCRIPTION OF PROCEDURE:  The patient was identified in the holding room and transported to the operating room and placed in the supine position under the operating microscope.  The right eye was identified as the operative eye, which was prepped and draped in the usual sterile ophthalmic fashion.   A 1 millimeter clear-corneal paracentesis was made superotemporally. Preservative-free 1% lidocaine mixed with 1:1,000 bisulfite-free aqueous solution of epinephrine was injected into the anterior chamber. The anterior chamber was then filled with Viscoat viscoelastic. A 2.4 millimeter keratome was used to make a clear-corneal incision inferotemporally. A curvilinear capsulorrhexis was made with a cystotome and capsulorrhexis forceps. Balanced salt solution was used to hydrodissect and hydrodelineate the nucleus. Phacoemulsification was then used to remove the lens nucleus and epinucleus. The remaining cortex was then removed using the irrigation and aspiration handpiece. Provisc was then placed into the capsular bag to distend it for lens placement. A +20.00 D [id number] intraocular lens was  then injected into the capsular bag. The remaining viscoelastic was aspirated.   Wounds were hydrated with balanced salt solution.  The anterior chamber was inflated to a physiologic pressure with balanced salt solution.  No wound leaks were noted. Vigamox was injected intracamerally.  Timolol and Brimonidine drops were applied to the eye.  The patient was taken to the recovery room in stable condition without complications of anesthesia or surgery.  Rolly Pancake Reader 04/18/2023, 10:31 AM

## 2023-04-18 NOTE — Anesthesia Preprocedure Evaluation (Addendum)
Anesthesia Evaluation  Patient identified by MRN, date of birth, ID band Patient awake    Reviewed: Allergy & Precautions, H&P , NPO status , Patient's Chart, lab work & pertinent test results, reviewed documented beta blocker date and time   History of Anesthesia Complications Negative for: history of anesthetic complications  Airway Mallampati: III  TM Distance: >3 FB Neck ROM: full    Dental  (+) Dental Advidsory Given, Caps, Missing Bridge on 2 front top teeth:   Pulmonary neg shortness of breath, Continuous Positive Airway Pressure Ventilation , neg COPD, neg recent URI, Current Smoker   Pulmonary exam normal breath sounds clear to auscultation       Cardiovascular Exercise Tolerance: Good hypertension, (-) angina + Peripheral Vascular Disease  (-) Past MI and (-) Cardiac Stents Normal cardiovascular exam(-) dysrhythmias (-) Valvular Problems/Murmurs Rhythm:regular Rate:Normal     Neuro/Psych negative neurological ROS  negative psych ROS   GI/Hepatic Neg liver ROS,GERD  ,,  Endo/Other  diabetes    Renal/GU negative Renal ROS  negative genitourinary   Musculoskeletal   Abdominal   Peds  Hematology negative hematology ROS (+)   Anesthesia Other Findings Past Medical History: No date: Arthritis     Comment:  NECK No date: Diabetes mellitus, type II (HCC) No date: Dysfunctional alcohol use     Comment:  now quit No date: GERD (gastroesophageal reflux disease) No date: Hypertension No date: Neuropathy     Comment:  feet No date: Pancreatitis No date: Peripheral vascular disease (HCC) No date: Stress fracture of ankle   Reproductive/Obstetrics negative OB ROS                             Anesthesia Physical Anesthesia Plan  ASA: 3  Anesthesia Plan: MAC   Post-op Pain Management:    Induction: Intravenous  PONV Risk Score and Plan: 0 and Midazolam and Treatment may vary due  to age or medical condition  Airway Management Planned: Natural Airway and Nasal Cannula  Additional Equipment:   Intra-op Plan:   Post-operative Plan:   Informed Consent: I have reviewed the patients History and Physical, chart, labs and discussed the procedure including the risks, benefits and alternatives for the proposed anesthesia with the patient or authorized representative who has indicated his/her understanding and acceptance.     Dental Advisory Given  Plan Discussed with: Anesthesiologist, CRNA and Surgeon  Anesthesia Plan Comments:        Anesthesia Quick Evaluation

## 2023-04-18 NOTE — H&P (Signed)
[facility]   Primary Care Physician:  Sherlyn Hay, DO Ophthalmologist: Dr. Deberah Pelton  Pre-Procedure History & Physical: HPI:  Oscar Mills is a 72 y.o. male here for cataract surgery.   Past Medical History:  Diagnosis Date   Arthritis    NECK   Diabetes mellitus, type II (HCC)    Dysfunctional alcohol use    now quit   GERD (gastroesophageal reflux disease)    Hypertension    Neuropathy    feet   Pancreatitis    Peripheral vascular disease (HCC)    Stress fracture of ankle     Past Surgical History:  Procedure Laterality Date   ABDOMINAL AORTOGRAM W/LOWER EXTREMITY Bilateral 10/28/2019   Procedure: ABDOMINAL AORTOGRAM W/LOWER EXTREMITY;  Surgeon: Iran Ouch, MD;  Location: [facility];  Service: Cardiovascular;  Laterality: Bilateral;   ABDOMINAL AORTOGRAM W/LOWER EXTREMITY Bilateral 06/29/2020   Procedure: ABDOMINAL AORTOGRAM W/LOWER EXTREMITY;  Surgeon: Iran Ouch, MD;  Location: [facility];  Service: Cardiovascular;  Laterality: Bilateral;   CATARACT EXTRACTION W/PHACO Left 09/03/2019   Procedure: CATARACT EXTRACTION PHACO AND INTRAOCULAR LENS PLACEMENT (IOC) LEFT VISION BLUE 9.84 00:54.9 17.9%;  Surgeon: Elliot Cousin, MD;  Location: [facility];  Service: Ophthalmology;  Laterality: Left;  Diabetic - diet controlled   LOWER EXTREMITY ANGIOGRAM Bilateral 02/24/2014   Procedure: LOWER EXTREMITY ANGIOGRAM;  Surgeon: Iran Ouch, MD;  Location: [facility];  Service: Cardiovascular;  Laterality: Bilateral;   PERIPHERAL VASCULAR CATHETERIZATION     PERIPHERAL VASCULAR INTERVENTION  10/28/2019   Procedure: PERIPHERAL VASCULAR INTERVENTION;  Surgeon: Iran Ouch, MD;  Location: [facility];  Service: Cardiovascular;;   PERIPHERAL VASCULAR INTERVENTION  06/29/2020   Procedure: PERIPHERAL VASCULAR INTERVENTION;  Surgeon: Iran Ouch, MD;  Location: [facility];  Service: Cardiovascular;;   UMBILICAL  HERNIA REPAIR N/A 03/05/2017   Procedure: HERNIA REPAIR UMBILICAL ADULT;  Surgeon: Nadeen Landau, MD;  Location: [facility];  Service: General;  Laterality: N/A;    Prior to Admission medications   Medication Sig Start Date End Date Taking? Authorizing Provider  acetaminophen (TYLENOL) 500 MG tablet Take 1,000 mg by mouth every 6 (six) hours as needed for moderate pain.   Yes [provider]  amLODipine (NORVASC) 10 MG tablet TAKE 1 TABLET(10 MG) BY MOUTH DAILY 07/19/22  Yes Iran Ouch, MD  Blood Glucose Monitoring Suppl DEVI 1 each by Does not apply route in the morning and at bedtime. May substitute to any manufacturer covered by patient's insurance. 03/15/23  Yes Jacky Kindle, FNP  gabapentin (NEURONTIN) 300 MG capsule Take 300 mg by mouth in the morning and at bedtime. 05/04/17  Yes [provider]  Glucose Blood (BLOOD GLUCOSE TEST STRIPS) STRP 1 each by In Vitro route in the morning and at bedtime. May substitute to any manufacturer covered by patient's insurance. 03/15/23 05/04/23 Yes Merita Norton T, FNP  ondansetron (ZOFRAN-ODT) 4 MG disintegrating tablet DISSOLVE 1 TABLET(4 MG) ON THE TONGUE EVERY 8 HOURS AS NEEDED FOR NAUSEA OR VOMITING 04/03/23  Yes Pardue, Monico Blitz, DO  pantoprazole (PROTONIX) 40 MG tablet Take 1 tablet (40 mg total) by mouth daily. 07/19/22  Yes Iran Ouch, MD  clopidogrel (PLAVIX) 75 MG tablet Take 1 tablet (75 mg total) by mouth daily. 07/19/22   Iran Ouch, MD  Continuous Glucose Receiver (FREESTYLE LIBRE 3 READER) DEVI 1 each by Does not apply route once for  1 dose. 04/11/23 04/11/23  Sherlyn Hay, DO  Continuous Glucose Sensor (FREESTYLE LIBRE 3 SENSOR) MISC 1 each by Does not apply route every 14 (fourteen) days. Place 1 sensor on the skin every 14 days. Use to check glucose continuously Patient not taking: Reported on 04/17/2023 04/11/23   Sherlyn Hay, DO  insulin glargine (LANTUS SOLOSTAR) 100 UNIT/ML Solostar Pen  Inject 30 Units into the skin at bedtime. Check fasting blood sugar every morning; titrate dose by 2 units every 3 days to meet fasting blood sugar goal of <150. Start with 20 units and titrate to 50 units (as estimated max dose) 04/17/23   Reardon, Freddi Starr, NP  Insulin Pen Needle (PEN NEEDLES) 31G X 5 MM MISC Use to inject insulin once daily 04/17/23   Dani Gobble, NP  mirtazapine (REMERON) 7.5 MG tablet TAKE 1 TABLET(7.5 MG) BY MOUTH AT BEDTIME 04/11/23   Merita Norton T, FNP  rosuvastatin (CRESTOR) 10 MG tablet TAKE 1 TABLET(10 MG) BY MOUTH DAILY 08/09/22   Iran Ouch, MD    Allergies as of 03/11/2023 - Review Complete 07/24/2022  Allergen Reaction Noted   Iohexol  10/07/2007    Family History  Problem Relation Age of Onset   Heart attack Father     Social History   Socioeconomic History   Marital status: Married    Spouse name: Not on file   Number of children: 2   Years of education: 12   Highest education level: 11th grade  Occupational History   Occupation: self-employed  Tobacco Use   Smoking status: Every Day    Current packs/day: 0.50    Average packs/day: 0.5 packs/day for 40.0 years (20.0 ttl pk-yrs)    Types: Cigarettes   Smokeless tobacco: Never  Vaping Use   Vaping status: Never Used  Substance and Sexual Activity   Alcohol use: Not Currently    Comment: quit around 2012   Drug use: No   Sexual activity: Not on file  Other Topics Concern   Not on file  Social History Narrative   Not on file   Social Determinants of Health   Financial Resource Strain: Low Risk  (03/13/2023)   Overall Financial Resource Strain (CARDIA)    Difficulty of Paying Living Expenses: Not very hard  Food Insecurity: No Food Insecurity (04/07/2023)   Hunger Vital Sign    Worried About Running Out of Food in the Last Year: Never true    Ran Out of Food in the Last Year: Never true  Transportation Needs: No Transportation Needs (04/07/2023)   PRAPARE -  Administrator, Civil Service (Medical): No    Lack of Transportation (Non-Medical): No  Physical Activity: Insufficiently Active (04/07/2023)   Exercise Vital Sign    Days of Exercise per Week: 1 day    Minutes of Exercise per Session: 70 min  Stress: No Stress Concern Present (04/07/2023)   [employer] of Occupational Health - Occupational Stress Questionnaire    Feeling of Stress : Not at all  Recent Concern: Stress - Stress Concern Present (03/13/2023)   [employer] of Occupational Health - Occupational Stress Questionnaire    Feeling of Stress : To some extent  Social Connections: Unknown (04/07/2023)   Social Connection and Isolation Panel [NHANES]    Frequency of Communication with Friends and Family: Twice a week    Frequency of Social Gatherings with Friends and Family: Once a week    Attends Religious Services: Patient declined  Active Member of Clubs or Organizations: No    Attends Banker Meetings: Never    Marital Status: Married  Recent Concern: Social Connections - Moderately Isolated (03/13/2023)   Social Connection and Isolation Panel [NHANES]    Frequency of Communication with Friends and Family: Twice a week    Frequency of Social Gatherings with Friends and Family: Once a week    Attends Religious Services: Never    Database administrator or Organizations: No    Attends Banker Meetings: Never    Marital Status: Married  Catering manager Violence: Not At Risk (07/24/2022)   Humiliation, Afraid, Rape, and Kick questionnaire    Fear of Current or Ex-Partner: No    Emotionally Abused: No    Physically Abused: No    Sexually Abused: No    Review of Systems: See HPI, otherwise negative ROS  Physical Exam: There were no vitals taken for this visit. General:   Alert, cooperative in NAD Head:  Normocephalic and atraumatic. Respiratory:  Normal work of breathing. Cardiovascular:  RRR  Impression/Plan: Oscar Mills is here for cataract surgery.  Risks, benefits, limitations, and alternatives regarding cataract surgery have been reviewed with the patient.  Questions have been answered.  All parties agreeable.   Estanislado Pandy, MD  04/18/2023, 7:13 AM

## 2023-04-18 NOTE — Anesthesia Postprocedure Evaluation (Signed)
Anesthesia Post Note  [name]  Procedure(s) Performed: CATARACT EXTRACTION PHACO AND INTRAOCULAR LENS PLACEMENT (IOC) RIGHT DIABETIC  RAYNER LENS TRIAL 11.80 01:50.0 (Right)  Patient location during evaluation: PACU Anesthesia Type: MAC Level of consciousness: awake and alert Pain management: pain level controlled Vital Signs Assessment: post-procedure vital signs reviewed and stable Respiratory status: spontaneous breathing, nonlabored ventilation, respiratory function stable and patient connected to nasal cannula oxygen Cardiovascular status: stable and blood pressure returned to baseline Postop Assessment: no apparent nausea or vomiting Anesthetic complications: no   No notable events documented.   Last Vitals:  Vitals:   04/18/23 1033 04/18/23 1037  BP: 134/65 134/66  Pulse: (!) 57 (!) 53  Resp: (!) 24 (!) 22  Temp: 36.8 C   SpO2: 100% 95%    Last Pain:  Vitals:   04/18/23 1037  TempSrc:   PainSc: 0-No pain                 Lenard Simmer

## 2023-04-19 ENCOUNTER — Telehealth: Payer: Self-pay

## 2023-04-19 ENCOUNTER — Encounter: Payer: Self-pay | Admitting: Ophthalmology

## 2023-04-19 ENCOUNTER — Other Ambulatory Visit: Payer: Self-pay

## 2023-04-19 ENCOUNTER — Encounter: Payer: Self-pay | Admitting: Oncology

## 2023-04-19 ENCOUNTER — Inpatient Hospital Stay: Payer: Medicare HMO | Attending: Oncology | Admitting: Oncology

## 2023-04-19 DIAGNOSIS — E118 Type 2 diabetes mellitus with unspecified complications: Secondary | ICD-10-CM

## 2023-04-19 MED ORDER — FREESTYLE LIBRE 3 SENSOR MISC: 1.0000 | 11 refills | Status: DC

## 2023-04-19 NOTE — Telephone Encounter (Signed)
Rx resent since pharmacy did not receive original

## 2023-04-19 NOTE — Telephone Encounter (Signed)
Transition Care Management Unsuccessful Follow-up Telephone Call  Date of discharge and from where:  [facility] 9/21  Attempts:  1st Attempt  Reason for unsuccessful TCM follow-up call:  No answer/busy   Lenard Forth [place]  [facility], [facility] Guide, Phone: [phone] Website: [email or web address]

## 2023-04-19 NOTE — Telephone Encounter (Signed)
Transition Care Management Unsuccessful Follow-up Telephone Call  Date of discharge and from where:  [facility] 9/21  Attempts:  2nd Attempt  Reason for unsuccessful TCM follow-up call:  No answer/busy   Lenard Forth [place]  [facility], [facility] Guide, Phone: [phone] Website: [email or web address]

## 2023-04-25 DIAGNOSIS — Z760 Encounter for issue of repeat prescription: Secondary | ICD-10-CM | POA: Diagnosis not present

## 2023-04-25 DIAGNOSIS — Z79899 Other long term (current) drug therapy: Secondary | ICD-10-CM | POA: Diagnosis not present

## 2023-04-25 DIAGNOSIS — M7918 Myalgia, other site: Secondary | ICD-10-CM | POA: Diagnosis not present

## 2023-04-25 DIAGNOSIS — G2581 Restless legs syndrome: Secondary | ICD-10-CM | POA: Diagnosis not present

## 2023-04-25 DIAGNOSIS — M7912 Myalgia of auxiliary muscles, head and neck: Secondary | ICD-10-CM | POA: Diagnosis not present

## 2023-05-15 DIAGNOSIS — Z0100 Encounter for examination of eyes and vision without abnormal findings: Secondary | ICD-10-CM | POA: Diagnosis not present

## 2023-05-28 ENCOUNTER — Ambulatory Visit (INDEPENDENT_AMBULATORY_CARE_PROVIDER_SITE_OTHER): Payer: Medicare HMO | Admitting: Gastroenterology

## 2023-07-12 ENCOUNTER — Ambulatory Visit: Payer: Medicare HMO | Admitting: Family Medicine

## 2023-07-18 ENCOUNTER — Ambulatory Visit: Payer: Medicare HMO | Admitting: Nurse Practitioner

## 2023-07-18 ENCOUNTER — Other Ambulatory Visit: Payer: Self-pay | Admitting: Cardiovascular Disease

## 2023-07-18 ENCOUNTER — Encounter: Payer: Self-pay | Admitting: Nurse Practitioner

## 2023-07-18 VITALS — BP 103/66 | HR 78 | Ht 74.0 in | Wt 134.4 lb

## 2023-07-18 DIAGNOSIS — I10 Essential (primary) hypertension: Secondary | ICD-10-CM | POA: Diagnosis not present

## 2023-07-18 DIAGNOSIS — Z794 Long term (current) use of insulin: Secondary | ICD-10-CM | POA: Diagnosis not present

## 2023-07-18 DIAGNOSIS — E1165 Type 2 diabetes mellitus with hyperglycemia: Secondary | ICD-10-CM

## 2023-07-18 LAB — POCT GLYCOSYLATED HEMOGLOBIN (HGB A1C): Hemoglobin A1C: 13.4 % — AB (ref 4.0–5.6)

## 2023-07-18 MED ORDER — PEN NEEDLES 31G X 5 MM MISC: 3 refills | Status: DC

## 2023-07-18 MED ORDER — GNP TRUE METRIX GLUCOSE STRIPS VI STRP: ORAL_STRIP | 12 refills | Status: DC

## 2023-07-18 MED ORDER — LANCETS MISC: 6 refills | Status: DC

## 2023-07-18 MED ORDER — LANTUS SOLOSTAR 100 UNIT/ML ~~LOC~~ SOPN: 30.0000 [IU] | PEN_INJECTOR | Freq: Every day | SUBCUTANEOUS | 3 refills | Status: DC

## 2023-07-18 NOTE — Telephone Encounter (Signed)
Hi,  Please outreach patient to schedule OD 12 month follow up.  Thank you,  Ferne Coe

## 2023-07-18 NOTE — Progress Notes (Addendum)
Endocrinology Follow Up Note       07/18/2023, 8:50 AM   Subjective:    Patient ID: Oscar Mills, male    DOB: [birth year].  Oscar Mills is being seen in follow up after being seen in consultation for management of currently uncontrolled symptomatic diabetes requested by  Sherlyn Hay, DO.   Past Medical History:  Diagnosis Date   Arthritis    NECK   Diabetes mellitus, type II (HCC)    Dysfunctional alcohol use    now quit   GERD (gastroesophageal reflux disease)    Hypertension    Neuropathy    feet   Pancreatitis    Peripheral vascular disease (HCC)    Stress fracture of ankle     Past Surgical History:  Procedure Laterality Date   ABDOMINAL AORTOGRAM W/LOWER EXTREMITY Bilateral 10/28/2019   Procedure: ABDOMINAL AORTOGRAM W/LOWER EXTREMITY;  Surgeon: Iran Ouch, MD;  Location: [facility];  Service: Cardiovascular;  Laterality: Bilateral;   ABDOMINAL AORTOGRAM W/LOWER EXTREMITY Bilateral 06/29/2020   Procedure: ABDOMINAL AORTOGRAM W/LOWER EXTREMITY;  Surgeon: Iran Ouch, MD;  Location: [facility];  Service: Cardiovascular;  Laterality: Bilateral;   CATARACT EXTRACTION W/PHACO Left 09/03/2019   Procedure: CATARACT EXTRACTION PHACO AND INTRAOCULAR LENS PLACEMENT (IOC) LEFT VISION BLUE 9.84 00:54.9 17.9%;  Surgeon: Elliot Cousin, MD;  Location: [facility];  Service: Ophthalmology;  Laterality: Left;  Diabetic - diet controlled   CATARACT EXTRACTION W/PHACO Right 04/18/2023   Procedure: CATARACT EXTRACTION PHACO AND INTRAOCULAR LENS PLACEMENT (IOC) RIGHT DIABETIC  RAYNER LENS TRIAL 11.80 01:50.0;  Surgeon: Estanislado Pandy, MD;  Location: [facility];  Service: Ophthalmology;  Laterality: Right;   LOWER EXTREMITY ANGIOGRAM Bilateral 02/24/2014   Procedure: LOWER EXTREMITY ANGIOGRAM;  Surgeon: Iran Ouch, MD;  Location: [facility];  Service:  Cardiovascular;  Laterality: Bilateral;   PERIPHERAL VASCULAR CATHETERIZATION     PERIPHERAL VASCULAR INTERVENTION  10/28/2019   Procedure: PERIPHERAL VASCULAR INTERVENTION;  Surgeon: Iran Ouch, MD;  Location: [facility];  Service: Cardiovascular;;   PERIPHERAL VASCULAR INTERVENTION  06/29/2020   Procedure: PERIPHERAL VASCULAR INTERVENTION;  Surgeon: Iran Ouch, MD;  Location: [facility];  Service: Cardiovascular;;   UMBILICAL HERNIA REPAIR N/A 03/05/2017   Procedure: HERNIA REPAIR UMBILICAL ADULT;  Surgeon: Nadeen Landau, MD;  Location: [facility];  Service: General;  Laterality: N/A;    Social History   Socioeconomic History   Marital status: Married    Spouse name: Not on file   Number of children: 2   Years of education: 12   Highest education level: 11th grade  Occupational History   Occupation: self-employed  Tobacco Use   Smoking status: Every Day    Current packs/day: 0.50    Average packs/day: 0.5 packs/day for 40.0 years (20.0 ttl pk-yrs)    Types: Cigarettes   Smokeless tobacco: Never  Vaping Use   Vaping status: Never Used  Substance and Sexual Activity   Alcohol use: Not Currently    Comment: quit around 2012   Drug use: No   Sexual activity: Not on file  Other Topics Concern  Not on file  Social History Narrative   Not on file   Social Drivers of Health   Financial Resource Strain: Low Risk  (03/13/2023)   Overall Financial Resource Strain (CARDIA)    Difficulty of Paying Living Expenses: Not very hard  Food Insecurity: No Food Insecurity (04/07/2023)   Hunger Vital Sign    Worried About Running Out of Food in the Last Year: Never true    Ran Out of Food in the Last Year: Never true  Transportation Needs: No Transportation Needs (04/07/2023)   PRAPARE - Administrator, Civil Service (Medical): No    Lack of Transportation (Non-Medical): No  Physical Activity: Insufficiently Active (04/07/2023)   Exercise Vital  Sign    Days of Exercise per Week: 1 day    Minutes of Exercise per Session: 70 min  Stress: No Stress Concern Present (04/07/2023)   [employer] of Occupational Health - Occupational Stress Questionnaire    Feeling of Stress : Not at all  Recent Concern: Stress - Stress Concern Present (03/13/2023)   [employer] of Occupational Health - Occupational Stress Questionnaire    Feeling of Stress : To some extent  Social Connections: Unknown (04/07/2023)   Social Connection and Isolation Panel [NHANES]    Frequency of Communication with Friends and Family: Twice a week    Frequency of Social Gatherings with Friends and Family: Once a week    Attends Religious Services: Patient declined    Database administrator or Organizations: No    Attends Engineer, structural: Never    Marital Status: Married  Recent Concern: Social Connections - Moderately Isolated (03/13/2023)   Social Connection and Isolation Panel [NHANES]    Frequency of Communication with Friends and Family: Twice a week    Frequency of Social Gatherings with Friends and Family: Once a week    Attends Religious Services: Never    Database administrator or Organizations: No    Attends Engineer, structural: Never    Marital Status: Married    Family History  Problem Relation Age of Onset   Heart attack Father     Outpatient Encounter Medications as of 07/18/2023  Medication Sig   acetaminophen (TYLENOL) 500 MG tablet Take 1,000 mg by mouth every 6 (six) hours as needed for moderate pain.   amLODipine (NORVASC) 10 MG tablet TAKE 1 TABLET(10 MG) BY MOUTH DAILY   Blood Glucose Monitoring Suppl DEVI 1 each by Does not apply route in the morning and at bedtime. May substitute to any manufacturer covered by patient's insurance.   clopidogrel (PLAVIX) 75 MG tablet Take 1 tablet (75 mg total) by mouth daily.   gabapentin (NEURONTIN) 300 MG capsule Take 300 mg by mouth in the morning and at bedtime.    glucose blood (GNP TRUE METRIX GLUCOSE STRIPS) test strip Use as instructed to monitor glucose twice daily   Lancets MISC Use to check glucose twice daily   mirtazapine (REMERON) 7.5 MG tablet TAKE 1 TABLET(7.5 MG) BY MOUTH AT BEDTIME   ondansetron (ZOFRAN-ODT) 4 MG disintegrating tablet DISSOLVE 1 TABLET(4 MG) ON THE TONGUE EVERY 8 HOURS AS NEEDED FOR NAUSEA OR VOMITING   pantoprazole (PROTONIX) 40 MG tablet Take 1 tablet (40 mg total) by mouth daily.   rosuvastatin (CRESTOR) 10 MG tablet TAKE 1 TABLET(10 MG) BY MOUTH DAILY   [DISCONTINUED] insulin glargine (LANTUS SOLOSTAR) 100 UNIT/ML Solostar Pen Inject 30 Units into the skin at bedtime. Check fasting blood  sugar every morning; titrate dose by 2 units every 3 days to meet fasting blood sugar goal of <150. Start with 20 units and titrate to 50 units (as estimated max dose)   [DISCONTINUED] Insulin Pen Needle (PEN NEEDLES) 31G X 5 MM MISC Use to inject insulin once daily   Continuous Glucose Receiver (FREESTYLE LIBRE 3 READER) DEVI 1 each by Does not apply route once for 1 dose.   Continuous Glucose Sensor (FREESTYLE LIBRE 3 SENSOR) MISC 1 each by Does not apply route every 14 (fourteen) days. Place 1 sensor on the skin every 14 days. Use to check glucose continuously (Patient not taking: Reported on 07/18/2023)   insulin glargine (LANTUS SOLOSTAR) 100 UNIT/ML Solostar Pen Inject 30 Units into the skin at bedtime. Check fasting blood sugar every morning; titrate dose by 2 units every 3 days to meet fasting blood sugar goal of <150. Start with 20 units and titrate to 50 units (as estimated max dose)   Insulin Pen Needle (PEN NEEDLES) 31G X 5 MM MISC Use to inject insulin once daily   No facility-administered encounter medications on file as of 07/18/2023.    ALLERGIES: Allergies  Allergen Reactions   Iohexol      Code: HIVES, Desc: HIVES WITH OMNIPAQUE 300 OBSERVED BY DR Gery Pray NO MEDS GIVEN     VACCINATION STATUS:  There is no immunization  history on file for this patient.  Diabetes He presents for his follow-up diabetic visit. He has type 2 diabetes mellitus. Onset time: diagnosed at approx age of 60. His disease course has been improving. Hypoglycemia symptoms include nervousness/anxiousness, sweats and tremors. Associated symptoms include blurred vision, fatigue, foot paresthesias, polydipsia, polyuria, weakness and weight loss. Hypoglycemia complications include nocturnal hypoglycemia (rare). Symptoms are stable. Diabetic complications include heart disease (has had several stents) and peripheral neuropathy. (Chronic pancreatitis) Risk factors for coronary artery disease include diabetes mellitus, family history, male sex, hypertension, stress and tobacco exposure. Current diabetic treatment includes insulin injections. He is compliant with treatment most of the time. His weight is decreasing steadily. He is following a generally unhealthy diet. When asked about meal planning, he reported none. He has not had a previous visit with a dietitian. He participates in exercise intermittently. His home blood glucose trend is decreasing steadily. His breakfast blood glucose range is generally 110-130 mg/dl. His bedtime blood glucose range is generally 180-200 mg/dl. (He presents today, accompanied by his spouse, with his logs, no meter or CGM showing mostly at goal glycemic profile in recent weeks.  Some of his readings are suspicious for being made up due to repeating even numbers.  He decided not to use the CGM as he thought it would not stick on his arm due to his job.  His POCT A1c today is 13.4%, improving from last visit of >15.5%.  He reports fasting hypoglycemia on one occasion, before his cataract surgery.) An ACE inhibitor/angiotensin II receptor blocker is not being taken. He sees a podiatrist.Eye exam is current.     Review of systems  Constitutional: + Minimally fluctuating body weight, current Body mass index is 17.26 kg/m., no  fatigue, no subjective hyperthermia, no subjective hypothermia Eyes: no blurry vision, no xerophthalmia ENT: no sore throat, no nodules palpated in throat, no dysphagia/odynophagia, no hoarseness Cardiovascular: no chest pain, no shortness of breath, no palpitations, no leg swelling Respiratory: no cough, no shortness of breath Gastrointestinal: no nausea/vomiting/diarrhea Musculoskeletal: no muscle/joint aches Skin: no rashes, no hyperemia Neurological: no tremors, no numbness, no  tingling, no dizziness Psychiatric: no depression, no anxiety  Objective:     BP 103/66 (BP Location: Left Arm, Patient Position: Sitting, Cuff Size: Large)   Pulse 78   Ht 6\' 2"  (1.88 m)   Wt 134 lb 6.4 oz (61 kg)   BMI 17.26 kg/m   Wt Readings from Last 3 Encounters:  07/18/23 134 lb 6.4 oz (61 kg)  04/18/23 136 lb (61.7 kg)  04/17/23 137 lb 3.2 oz (62.2 kg)     BP Readings from Last 3 Encounters:  07/18/23 103/66  04/18/23 134/66  04/17/23 106/70     Physical Exam- Limited  Constitutional:  Body mass index is 17.26 kg/m. , not in acute distress, normal state of mind Eyes:  EOMI, no exophthalmos Musculoskeletal: no gross deformities, strength intact in all four extremities, no gross restriction of joint movements Skin:  no rashes, no hyperemia Neurological: no tremor with outstretched hands   Diabetic Foot Exam - Simple   Simple Foot Form Diabetic Foot exam was performed with the following findings: Yes 07/18/2023  8:49 AM  Visual Inspection See comments: Yes Sensation Testing Intact to touch and monofilament testing bilaterally: Yes Pulse Check Posterior Tibialis and Dorsalis pulse intact bilaterally: Yes Comments Ingrown nails bilaterally, onychomycosis bilaterally.      CMP ( most recent) CMP     Component Value Date/Time   NA 132 (L) 03/16/2023 1500   NA 131 (L) 03/14/2023 1458   K 4.9 03/16/2023 1500   CL 104 03/16/2023 1500   CO2 16 (L) 03/16/2023 1500   GLUCOSE 223  (H) 03/16/2023 1500   BUN 14 03/16/2023 1500   BUN 18 03/14/2023 1458   CREATININE 0.71 03/16/2023 1500   CREATININE 0.76 01/18/2022 1121   CALCIUM 9.0 03/16/2023 1500   PROT 6.5 03/14/2023 1458   ALBUMIN 4.1 03/14/2023 1458   AST 10 03/14/2023 1458   ALT 33 03/14/2023 1458   ALKPHOS 120 03/14/2023 1458   BILITOT 0.3 03/14/2023 1458   EGFR 95 03/14/2023 1458   GFRNONAA >60 03/16/2023 1500   GFRNONAA 97 06/13/2020 1217     Diabetic Labs (most recent): Lab Results  Component Value Date   HGBA1C 13.4 (A) 07/18/2023   HGBA1C >15.5 (H) 03/14/2023   HGBA1C 12.5 (H) 01/18/2022   MICROALBUR CANCELED 01/18/2022   MICROALBUR 3.4 06/13/2020     Lipid Panel ( most recent) Lipid Panel     Component Value Date/Time   CHOL 106 03/14/2023 1458   TRIG 138 03/14/2023 1458   HDL 45 03/14/2023 1458   CHOLHDL 2.4 03/14/2023 1458   CHOLHDL 3.6 01/18/2022 1121   VLDL 15 10/22/2019 1726   LDLCALC 37 03/14/2023 1458   LDLCALC 78 01/18/2022 1121   LABVLDL 24 03/14/2023 1458      Lab Results  Component Value Date   TSH 1.430 03/14/2023   TSH 1.143 10/15/2012           Assessment & Plan:   1) Type 2 diabetes mellitus with hyperglycemia, with long-term current use of insulin (HCC)  He presents today, accompanied by his spouse, with his logs, no meter or CGM showing mostly at goal glycemic profile in recent weeks.  Some of his readings are suspicious for being made up due to repeating even numbers.  He decided not to use the CGM as he thought it would not stick on his arm due to his job.  His POCT A1c today is 13.4%, improving from last visit of >15.5%.  He reports  fasting hypoglycemia on one occasion, before his cataract surgery.  - Oscar Mills has currently uncontrolled symptomatic type 2 DM since 73 years of age.   -Recent labs reviewed.  - I had a long discussion with him about the progressive nature of diabetes and the pathology behind its complications. -his diabetes  is complicated by chronic pancreatitis, PVD and he remains at a high risk for more acute and chronic complications which include CAD, CVA, CKD, retinopathy, and neuropathy. These are all discussed in detail with him.  The following Lifestyle Medicine recommendations according to American College of Lifestyle Medicine [facility]) were discussed and offered to patient and he agrees to start the journey:  A. Whole Foods, Plant-based plate comprising of fruits and vegetables, plant-based proteins, whole-grain carbohydrates was discussed in detail with the patient.   A list for source of those nutrients were also provided to the patient.  Patient will use only water or unsweetened tea for hydration. B.  The need to stay away from risky substances including alcohol, smoking; obtaining 7 to 9 hours of restorative sleep, at least 150 minutes of moderate intensity exercise weekly, the importance of healthy social connections,  and stress reduction techniques were discussed. C.  A full color page of  Calorie density of various food groups per pound showing examples of each food groups was provided to the patient.  - Nutritional counseling repeated at each appointment due to patients tendency to fall back in to old habits.  - The patient admits there is a room for improvement in their diet and drink choices. -  Suggestion is made for the patient to avoid simple carbohydrates from their diet including Cakes, Sweet Desserts / Pastries, Ice Cream, Soda (diet and regular), Sweet Tea, Candies, Chips, Cookies, Sweet Pastries, Store Bought Juices, Alcohol in Excess of 1-2 drinks a day, Artificial Sweeteners, Coffee Creamer, and "Sugar-free" Products. This will help patient to have stable blood glucose profile and potentially avoid unintended weight gain.   - I encouraged the patient to switch to unprocessed or minimally processed complex starch and increased protein intake (animal or plant source), fruits, and vegetables.    - Patient is advised to stick to a routine mealtimes to eat 3 meals a day and avoid unnecessary snacks (to snack only to correct hypoglycemia).  - I have approached him with the following individualized plan to manage his diabetes and patient agrees:   -Given improving readings, he is advised to continue Lantus 30 units SQ nightly.  May consider adding prandial insulin at next visit if A1c has not significantly improved.  I suspect he will need this due to chronic pancreatitis, preventing his pancreas from producing enough insulin.  -he is encouraged to start monitoring glucose 2 times daily, before breakfast and before bed, to log their readings on the clinic sheets provided, and bring them to review at follow up appointment in 3 months.  I encouraged him to reconsider using the CGM as it will help identify his need for prandial insulin.  - he is warned not to take insulin without proper monitoring per orders. - Adjustment parameters are given to him for hypo and hyperglycemia in writing. - he is encouraged to call clinic for blood glucose levels less than 70 or above 300 mg /dl.  - he is not an ideal candidate for incretin therapy due to chronic pancreatitis and body habitus of BMI less than 25..  - Specific targets for  A1c; LDL, HDL, and Triglycerides were discussed with  the patient.  2) Blood Pressure /Hypertension:  his blood pressure is controlled to target.   he is advised to continue his current medications including Amlodipine 10 mg p.o. daily with breakfast.  3) Lipids/Hyperlipidemia:    Review of his recent lipid panel from 03/14/23 showed controlled LDL at 37 .  he is advised to continue Crestor 10 mg daily at bedtime.  Side effects and precautions discussed with him.  4)  Weight/Diet:  his Body mass index is 17.26 kg/m.  -  he is NOT a candidate for weight loss.  Exercise, and detailed carbohydrates information provided  -  detailed on discharge instructions.  5) Chronic  Care/Health Maintenance: -he is not on ACEI/ARB and is on Statin medications and is encouraged to initiate and continue to follow up with Ophthalmology, Dentist, Podiatrist at least yearly or according to recommendations, and advised to QUIT SMOKING. I have recommended yearly flu vaccine and pneumonia vaccine at least every 5 years; moderate intensity exercise for up to 150 minutes weekly; and sleep for at least 7 hours a day.  - he is advised to maintain close follow up with Sherlyn Hay, DO for primary care needs, as well as his other providers for optimal and coordinated care.     I spent  47  minutes in the care of the patient today including review of labs from CMP, Lipids, Thyroid Function, Hematology (current and previous including abstractions from other facilities); face-to-face time discussing  his blood glucose readings/logs, discussing hypoglycemia and hyperglycemia episodes and symptoms, medications doses, his options of short and long term treatment based on the latest standards of care / guidelines;  discussion about incorporating lifestyle medicine;  and documenting the encounter. Risk reduction counseling performed per USPSTF guidelines to reduce obesity and cardiovascular risk factors.     Please refer to Patient Instructions for Blood Glucose Monitoring and Insulin/Medications Dosing Guide"  in media tab for additional information. Please  also refer to " Patient Self Inventory" in the Media  tab for reviewed elements of pertinent patient history.  Oscar Mills participated in the discussions, expressed understanding, and voiced agreement with the above plans.  All questions were answered to his satisfaction. he is encouraged to contact clinic should he have any questions or concerns prior to his return visit.     Follow up plan: - Return in about 3 months (around 10/16/2023) for Diabetes F/U with A1c in office, No previsit labs, Bring meter and logs.   Ronny Bacon, [facility] [address] [place], [place] [address] Phone: [phone] Fax: [phone]  07/18/2023, 8:50 AM

## 2023-07-22 DIAGNOSIS — L570 Actinic keratosis: Secondary | ICD-10-CM | POA: Diagnosis not present

## 2023-07-22 DIAGNOSIS — X32XXXA Exposure to sunlight, initial encounter: Secondary | ICD-10-CM | POA: Diagnosis not present

## 2023-07-22 DIAGNOSIS — C44329 Squamous cell carcinoma of skin of other parts of face: Secondary | ICD-10-CM | POA: Diagnosis not present

## 2023-07-23 NOTE — Progress Notes (Signed)
Cardiology Clinic Note   Date: 07/26/2023 ID: Oscar, Mills 1950/11/16, MRN [id number]  Primary Cardiologist:  Lorine Bears, MD  Patient Profile    Oscar Mills is a 73 y.o. male who presents to the clinic today for routine follow up of PAD.     Past medical history significant for: PAD.  Abdominal aortogram with lower extremities 10/28/2019:  No significant aortic stenosis. Right lower extremity: Flush occlusion of the common iliac artery with significant disease in the proximal external iliac artery, diffuse subocclusive disease in the SFA with short occlusion in the mid segment with reconstitution via collaterals from the profunda followed by short segment occlusion of the popliteal artery above the knee and likely three-vessel runoff below the knee. Severe left common iliac artery stenosis.  The left leg was not fully imaged. Successful complex revascularization of right common iliac and external iliac as well as left common iliac artery with kissing stent placement extending into the distal aorta. Abdominal aortogram with lower extremities 06/29/2020:  Patent bilateral common iliac artery stents with minimal restenosis.  There is severe subtotal occlusion in the right mid external iliac artery with occluded internal iliac artery. Successful angioplasty and self-expanding stent placement to the right external iliac artery. Vascular ultrasound aorta/IVC/iliacs and ABI 02/22/2023: Moderate bilateral lower extremity arterial disease. Patent iliac stents.  No evidence of AAA. Hypertension.  Hyperlipidemia.  Lipid panel 03/14/2023: LDL 37, HDL 45, TG 138, total 106.  GERD.  T2DM.  Tobacco abuse.   In summary, patient was first evaluated by Dr. Rennis Golden on 01/21/2014 for claudication. Dopplers performed prior to visit showed right ABI 0.4 and TBI 0.35, left ABI 1.2 and TBI 1. He was referred to Dr. Kirke Corin for angiography. He underwent abdominal aortogram with bilateral lower extremities  which showed no significant aortoiliac disease or infrainguinal disease on the left. There was a long occlusion of the right SFA from proximal to distal segment as well as a short occlusion in the proximal popliteal with 3 vessel runoff below the knee. Medical management was recommended. His symptoms improved with cilostazol and remained stable until March 2021 when he developed worsening right leg claudication with severe cramping and numbness of the right foot as well as dark discoloration of toes. He underwent revascularization in May 2021 as detailed above. Repeat vascular studies in November 2021 showed an ABI of 0.56 on the right and 0.9 on the left. Duplex demonstrated severe stenosis of the right mid external iliac artery. He underwent repeat angiography in January 2022 with angioplasty as detailed above.      History of Present Illness    Oscar Mills is followed by Dr. Kirke Corin for the above outlined history.   Patient was last seen in the office by Dr. Kirke Corin on 07/19/2022 for routine follow up. He was doing well at that time with no complaints of claudication. Repeat ABIs August 2024 were stable and showed patent iliac stents.   Today, patient reports he is doing well. He continues to have right thigh discomfort that he attributes to neuropathy. He states it does not stop him from performing routine or desired activities. He works sporadically as a Psychologist, occupational. Otherwise he stays busy around his home. Patient denies shortness of breath, dyspnea on exertion, lower extremity edema, orthopnea or PND. No chest pain, pressure, or tightness. No palpitations. He has a bandage under his eye and states he had a skin lesion removed and is waiting to hear if it is cancerous.  ROS: All other systems reviewed and are otherwise negative except as noted in History of Present Illness.  EKGs/Labs Reviewed    EKG Interpretation Date/Time:  Friday July 26 2023 09:54:36 EST Ventricular Rate:  72 PR  Interval:  104 QRS Duration:  100 QT Interval:  376 QTC Calculation: 411 R Axis:   90  Text Interpretation: Sinus rhythm with short PR with Premature atrial complexes Rightward axis When compared with ECG of 07/19/2022 no significant changes Confirmed by Carlos Levering [phone]) on 07/26/2023 10:02:27 AM   03/14/2023: ALT 33; AST 10 03/16/2023: BUN 14; Creatinine, Ser 0.71; Potassium 4.9; Sodium 132   03/22/2023: Hemoglobin 14.7; WBC Count 13.6   03/14/2023: TSH 1.430       Physical Exam    VS:  BP 138/66   Pulse 72   Ht 6\' 2"  (1.88 m)   Wt 130 lb 9.6 oz (59.2 kg)   SpO2 98%   BMI 16.77 kg/m  , BMI Body mass index is 16.77 kg/m.  GEN: Well nourished, well developed, in no acute distress. Neck: No JVD or carotid bruits. Cardiac:  RRR. Occasional extrasystole.  No murmurs. No rubs or gallops.   Respiratory:  Respirations regular and unlabored. Clear to auscultation without rales, wheezing or rhonchi. GI: Soft, nontender, nondistended. Extremities: Radials 2+ and equal bilaterally. PT/DP pulses 1+ on the right and 2+ on the left. No clubbing or cyanosis. No edema.  Skin: Warm and dry, no rash. Neuro: Strength intact.  Assessment & Plan   PAD S/p complex revascularization of right common iliac and external iliac and left common iliac arteries May 2021; stent placement to right external iliac artery January 2022. Stable ABI and patent iliac stents with repeat testing August 2024. Patient reports right thigh pain that is unchanged from previous. It does not prevent him from participating in normal or desired activities. He works sporadically as a Psychologist, occupational.  PT/DP pulses 1+ on the right and 2+ on the left.  -Continue Plavix, rosuvastatin.   Hypertension BP today  138/66. No reports of headaches or dizziness.  -Continue amlodipine.   Hyperlipidemia LDL September 2024 37, at goal.  -Continue rosuvastatin.   Tobacco abuse Patient continues to smoke 1/2 pack a day.  -Encouraged  completed cessation.   Disposition: Return in 1 year or sooner as needed. He knows to schedule repeat US for August 2025.          Signed, Etta Grandchild. Nastasha Reising, DNP, NP-C

## 2023-07-25 DIAGNOSIS — M7912 Myalgia of auxiliary muscles, head and neck: Secondary | ICD-10-CM | POA: Diagnosis not present

## 2023-07-25 DIAGNOSIS — Z760 Encounter for issue of repeat prescription: Secondary | ICD-10-CM | POA: Diagnosis not present

## 2023-07-25 DIAGNOSIS — M7918 Myalgia, other site: Secondary | ICD-10-CM | POA: Diagnosis not present

## 2023-07-25 DIAGNOSIS — G2581 Restless legs syndrome: Secondary | ICD-10-CM | POA: Diagnosis not present

## 2023-07-25 DIAGNOSIS — Z79899 Other long term (current) drug therapy: Secondary | ICD-10-CM | POA: Diagnosis not present

## 2023-07-26 ENCOUNTER — Ambulatory Visit: Payer: Medicare HMO | Attending: Student | Admitting: Student

## 2023-07-26 ENCOUNTER — Encounter: Payer: Self-pay | Admitting: Student

## 2023-07-26 VITALS — BP 138/66 | HR 72 | Ht 74.0 in | Wt 130.6 lb

## 2023-07-26 DIAGNOSIS — E785 Hyperlipidemia, unspecified: Secondary | ICD-10-CM | POA: Diagnosis not present

## 2023-07-26 DIAGNOSIS — I10 Essential (primary) hypertension: Secondary | ICD-10-CM

## 2023-07-26 DIAGNOSIS — I739 Peripheral vascular disease, unspecified: Secondary | ICD-10-CM

## 2023-07-26 DIAGNOSIS — Z720 Tobacco use: Secondary | ICD-10-CM | POA: Diagnosis not present

## 2023-07-26 NOTE — Patient Instructions (Signed)
Medication Instructions:   Your physician recommends that you continue on your current medications as directed. Please refer to the Current Medication list given to you today.  *If you need a refill on your cardiac medications before your next appointment, please call your pharmacy*   Lab Work:  No labs ordered today  If you have labs (blood work) drawn today and your tests are completely normal, you will receive your results only by: MyChart Message (if you have MyChart) OR A paper copy in the mail If you have any lab test that is abnormal or we need to change your treatment, we will call you to review the results.   Testing/Procedures:  No test ordered today    Follow-Up: At [facility], you and your health needs are our priority.  As part of our continuing mission to provide you with exceptional heart care, we have created designated Provider Care Teams.  These Care Teams include your primary Cardiologist (physician) and Advanced Practice Providers (APPs -  Physician Assistants and Nurse Practitioners) who all work together to provide you with the care you need, when you need it.  Your next appointment:   1 year(s)  Provider:   You may see Lorine Bears, MD  or one of the following Advanced Practice Providers on your designated Care Team:    Carlos Levering, NP

## 2023-08-10 ENCOUNTER — Other Ambulatory Visit: Payer: Self-pay | Admitting: Cardiovascular Disease

## 2023-08-14 DIAGNOSIS — D485 Neoplasm of uncertain behavior of skin: Secondary | ICD-10-CM | POA: Diagnosis not present

## 2023-08-14 DIAGNOSIS — L309 Dermatitis, unspecified: Secondary | ICD-10-CM | POA: Diagnosis not present

## 2023-08-19 ENCOUNTER — Other Ambulatory Visit: Payer: Self-pay | Admitting: Cardiovascular Disease

## 2023-08-20 ENCOUNTER — Ambulatory Visit (INDEPENDENT_AMBULATORY_CARE_PROVIDER_SITE_OTHER): Payer: Medicare HMO

## 2023-08-20 VITALS — Ht 74.0 in | Wt 138.0 lb

## 2023-08-20 DIAGNOSIS — Z0000 Encounter for general adult medical examination without abnormal findings: Secondary | ICD-10-CM | POA: Diagnosis not present

## 2023-08-20 NOTE — Patient Instructions (Addendum)
 Mr. Oscar Mills , Thank you for taking time to come for your Medicare Wellness Visit. I appreciate your ongoing commitment to your health goals. Please review the following plan we discussed and let me know if I can assist you in the future.   Referrals/Orders/Follow-Ups/Clinician Recommendations: Yes; Keep maintaining your health by keeping your appointments with Dr. Payton Mccallum and any specialists that you may see.  Call us if you need anything.  Have a great year!!!!  This is a list of the screening recommended for you and due dates:  Health Maintenance  Topic Date Due   COVID-19 Vaccine (1) Never done   Pneumonia Vaccine (1 of 2 - PCV) Never done   Eye exam for diabetics  Never done   DTaP/Tdap/Td vaccine (1 - Tdap) Never done   Zoster (Shingles) Vaccine (1 of 2) Never done   Colon Cancer Screening  Never done   Screening for Lung Cancer  02/17/2007   Flu Shot  09/23/2023*   Hemoglobin A1C  01/15/2024   Yearly kidney health urinalysis for diabetes  03/13/2024   Yearly kidney function blood test for diabetes  03/15/2024   Complete foot exam   07/17/2024   Medicare Annual Wellness Visit  08/19/2024   Hepatitis C Screening  Completed   HPV Vaccine  Aged Out  *Topic was postponed. The date shown is not the original due date.    Advanced directives: (Declined) Advance directive discussed with you today. Even though you declined this today, please call our office should you change your mind, and we can give you the proper paperwork for you to fill out.  Next Medicare Annual Wellness Visit scheduled for next year: Yes

## 2023-08-20 NOTE — Progress Notes (Signed)
 Subjective:   Oscar Mills is a 73 y.o. who presents for a Medicare Wellness preventive visit.  Visit Complete: Virtual I connected with  Rob Bunting on 08/20/23 by a audio enabled telemedicine application and verified that I am speaking with the correct person using two identifiers.  Patient Location: Home  Provider Location: Office/Clinic  I discussed the limitations of evaluation and management by telemedicine. The patient expressed understanding and agreed to proceed.  Vital Signs: Because this visit was a virtual/telehealth visit, some criteria may be missing or patient reported. Any vitals not documented were not able to be obtained and vitals that have been documented are patient reported.  VideoDeclined- This patient declined Librarian, academic. Therefore the visit was completed with audio only.  AWV Questionnaire: No: Patient Medicare AWV questionnaire was not completed prior to this visit.  Cardiac Risk Factors include: advanced age (>37men, >57 women);diabetes mellitus;dyslipidemia;family history of premature cardiovascular disease;hypertension;smoking/ tobacco exposure;male gender;sedentary lifestyle     Objective:    Today's Vitals   08/20/23 1543  Weight: 138 lb (62.6 kg)  Height: 6\' 2"  (1.88 m)  PainSc: 0-No pain   Body mass index is 17.72 kg/m.     08/20/2023    3:44 PM 04/18/2023    8:39 AM 03/22/2023   10:57 AM 03/16/2023   11:47 AM 07/24/2022    1:29 PM 06/29/2020    6:24 AM 10/28/2019   10:51 AM  Advanced Directives  Does Patient Have a Medical Advance Directive? Yes Yes Yes Yes No Yes Yes  Type of Estate agent of [place];Living will Healthcare Power of [place];Living will Living will Living will  Living will Healthcare Power of [place];Living will  Does patient want to make changes to medical advance directive?      No - Patient declined   Copy of Healthcare Power of Attorney in Chart? No -  copy requested      No - copy requested  Would patient like information on creating a medical advance directive?     No - Patient declined      Current Medications (verified) Outpatient Encounter Medications as of 08/20/2023  Medication Sig   acetaminophen (TYLENOL) 500 MG tablet Take 1,000 mg by mouth every 6 (six) hours as needed for moderate pain.   amLODipine (NORVASC) 10 MG tablet TAKE 1 TABLET(10 MG) BY MOUTH DAILY   Blood Glucose Monitoring Suppl DEVI 1 each by Does not apply route in the morning and at bedtime. May substitute to any manufacturer covered by patient's insurance.   clopidogrel (PLAVIX) 75 MG tablet Take 1 tablet (75 mg total) by mouth daily.   Continuous Glucose Receiver (FREESTYLE LIBRE 3 READER) DEVI 1 each by Does not apply route once for 1 dose.   Continuous Glucose Sensor (FREESTYLE LIBRE 3 SENSOR) MISC 1 each by Does not apply route every 14 (fourteen) days. Place 1 sensor on the skin every 14 days. Use to check glucose continuously (Patient not taking: Reported on 07/26/2023)   gabapentin (NEURONTIN) 300 MG capsule Take 300 mg by mouth in the morning and at bedtime.   glucose blood (GNP TRUE METRIX GLUCOSE STRIPS) test strip Use as instructed to monitor glucose twice daily   insulin glargine (LANTUS SOLOSTAR) 100 UNIT/ML Solostar Pen Inject 30 Units into the skin at bedtime. Check fasting blood sugar every morning; titrate dose by 2 units every 3 days to meet fasting blood sugar goal of <150. Start with 20 units and titrate to  50 units (as estimated max dose)   Insulin Pen Needle (PEN NEEDLES) 31G X 5 MM MISC Use to inject insulin once daily   Lancets MISC Use to check glucose twice daily   mirtazapine (REMERON) 7.5 MG tablet TAKE 1 TABLET(7.5 MG) BY MOUTH AT BEDTIME   ondansetron (ZOFRAN-ODT) 4 MG disintegrating tablet DISSOLVE 1 TABLET(4 MG) ON THE TONGUE EVERY 8 HOURS AS NEEDED FOR NAUSEA OR VOMITING   pantoprazole (PROTONIX) 40 MG tablet Take 1 tablet (40 mg total)  by mouth daily.   rosuvastatin (CRESTOR) 10 MG tablet TAKE 1 TABLET(10 MG) BY MOUTH DAILY   No facility-administered encounter medications on file as of 08/20/2023.    Allergies (verified) Iohexol   History: Past Medical History:  Diagnosis Date   Arthritis    NECK   Diabetes mellitus, type II (HCC)    Dysfunctional alcohol use    now quit   GERD (gastroesophageal reflux disease)    Hypertension    Neuropathy    feet   Pancreatitis    Peripheral vascular disease (HCC)    Stress fracture of ankle    Past Surgical History:  Procedure Laterality Date   ABDOMINAL AORTOGRAM W/LOWER EXTREMITY Bilateral 10/28/2019   Procedure: ABDOMINAL AORTOGRAM W/LOWER EXTREMITY;  Surgeon: Iran Ouch, MD;  Location: [facility];  Service: Cardiovascular;  Laterality: Bilateral;   ABDOMINAL AORTOGRAM W/LOWER EXTREMITY Bilateral 06/29/2020   Procedure: ABDOMINAL AORTOGRAM W/LOWER EXTREMITY;  Surgeon: Iran Ouch, MD;  Location: [facility];  Service: Cardiovascular;  Laterality: Bilateral;   CATARACT EXTRACTION W/PHACO Left 09/03/2019   Procedure: CATARACT EXTRACTION PHACO AND INTRAOCULAR LENS PLACEMENT (IOC) LEFT VISION BLUE 9.84 00:54.9 17.9%;  Surgeon: Elliot Cousin, MD;  Location: [facility];  Service: Ophthalmology;  Laterality: Left;  Diabetic - diet controlled   CATARACT EXTRACTION W/PHACO Right 04/18/2023   Procedure: CATARACT EXTRACTION PHACO AND INTRAOCULAR LENS PLACEMENT (IOC) RIGHT DIABETIC  RAYNER LENS TRIAL 11.80 01:50.0;  Surgeon: Estanislado Pandy, MD;  Location: [facility];  Service: Ophthalmology;  Laterality: Right;   LOWER EXTREMITY ANGIOGRAM Bilateral 02/24/2014   Procedure: LOWER EXTREMITY ANGIOGRAM;  Surgeon: Iran Ouch, MD;  Location: [facility];  Service: Cardiovascular;  Laterality: Bilateral;   PERIPHERAL VASCULAR CATHETERIZATION     PERIPHERAL VASCULAR INTERVENTION  10/28/2019   Procedure: PERIPHERAL VASCULAR INTERVENTION;   Surgeon: Iran Ouch, MD;  Location: [facility];  Service: Cardiovascular;;   PERIPHERAL VASCULAR INTERVENTION  06/29/2020   Procedure: PERIPHERAL VASCULAR INTERVENTION;  Surgeon: Iran Ouch, MD;  Location: [facility];  Service: Cardiovascular;;   UMBILICAL HERNIA REPAIR N/A 03/05/2017   Procedure: HERNIA REPAIR UMBILICAL ADULT;  Surgeon: Nadeen Landau, MD;  Location: [facility];  Service: General;  Laterality: N/A;   Family History  Problem Relation Age of Onset   Heart attack Father    Social History   Socioeconomic History   Marital status: Married    Spouse name: Not on file   Number of children: 2   Years of education: 12   Highest education level: 11th grade  Occupational History   Occupation: self-employed  Tobacco Use   Smoking status: Every Day    Current packs/day: 0.50    Average packs/day: 0.5 packs/day for 40.0 years (20.0 ttl pk-yrs)    Types: Cigarettes   Smokeless tobacco: Never  Vaping Use   Vaping status: Never Used  Substance and Sexual Activity   Alcohol use: Not Currently  Comment: quit around 2012   Drug use: No   Sexual activity: Not on file  Other Topics Concern   Not on file  Social History Narrative   Not on file   Social Drivers of Health   Financial Resource Strain: Low Risk  (03/13/2023)   Overall Financial Resource Strain (CARDIA)    Difficulty of Paying Living Expenses: Not very hard  Food Insecurity: No Food Insecurity (08/20/2023)   Hunger Vital Sign    Worried About Running Out of Food in the Last Year: Never true    Ran Out of Food in the Last Year: Never true  Transportation Needs: No Transportation Needs (08/20/2023)   PRAPARE - Administrator, Civil Service (Medical): No    Lack of Transportation (Non-Medical): No  Physical Activity: Insufficiently Active (08/20/2023)   Exercise Vital Sign    Days of Exercise per Week: 1 day    Minutes of Exercise per Session: 70 min  Stress: No Stress  Concern Present (08/20/2023)   [employer] of Occupational Health - Occupational Stress Questionnaire    Feeling of Stress : Not at all  Social Connections: Unknown (08/20/2023)   Social Connection and Isolation Panel [NHANES]    Frequency of Communication with Friends and Family: Twice a week    Frequency of Social Gatherings with Friends and Family: Once a week    Attends Religious Services: Patient declined    Database administrator or Organizations: No    Attends Engineer, structural: Never    Marital Status: Married    Tobacco Counseling Ready to quit: Not Answered Counseling given: Not Answered    Clinical Intake:  Pre-visit preparation completed: Yes  Pain : No/denies pain Pain Score: 0-No pain     BMI - recorded: 17.72 Nutritional Status: BMI <19  Underweight Nutritional Risks: None Diabetes: Yes CBG done?: No Did pt. bring in CBG monitor from home?: No  How often do you need to have someone help you when you read instructions, pamphlets, or other written materials from your doctor or pharmacy?: 1 - Never What is the last grade level you completed in school?: 11th grade  Interpreter Needed?: No  Information entered by :: Gianah Batt N. Mohammed Mcandrew, LPN.   Activities of Daily Living     08/20/2023    3:47 PM 04/18/2023    8:32 AM  In your present state of health, do you have any difficulty performing the following activities:  Hearing? 0 0  Vision? 0 0  Difficulty concentrating or making decisions? 0 0  Walking or climbing stairs? 0   Dressing or bathing? 0   Doing errands, shopping? 0   Preparing Food and eating ? N   Using the Toilet? N   In the past six months, have you accidently leaked urine? N   Do you have problems with loss of bowel control? N   Managing your Medications? N   Managing your Finances? N   Housekeeping or managing your Housekeeping? N     Patient Care Team: Pardue, Monico Blitz, DO as PCP - General (Family Medicine) Iran Ouch, MD as PCP - Cardiology (Cardiology) Creig Hines, MD as Consulting Physician (Oncology) [facility], [facility] (Optometry) Pardue, Monico Blitz, DO as Consulting Physician (Family Medicine)  Indicate any recent Medical Services you may have received from other than [facility] providers in the past year (date may be approximate).     Assessment:   This is a routine wellness  examination for [facility].  Hearing/Vision screen Hearing Screening - Comments:: Denies hearing difficulties.   Vision Screening - Comments:: No rx glasses - up to date with routine eye exams with [facility]    Goals Addressed             This Visit's Progress    Client understands the importance of follow-up with providers by attending scheduled visits       Drink more water.       Depression Screen     08/20/2023    3:46 PM 03/14/2023    2:09 PM 07/24/2022    1:28 PM 05/06/2017    2:20 PM  PHQ 2/9 Scores  PHQ - 2 Score 0 3 0 0  PHQ- 9 Score 0 13      Fall Risk     08/20/2023    3:45 PM 07/24/2022    1:27 PM  Fall Risk   Falls in the past year? 0 0  Number falls in past yr: 0 0  Injury with Fall? 0 0  Risk for fall due to : No Fall Risks   Follow up Falls prevention discussed;Falls evaluation completed Falls prevention discussed;Education provided;Falls evaluation completed    MEDICARE RISK AT HOME:  Medicare Risk at Home Any stairs in or around the home?: No If so, are there any without handrails?: No Home free of loose throw rugs in walkways, pet beds, electrical cords, etc?: Yes Adequate lighting in your home to reduce risk of falls?: Yes Life alert?: No Use of a cane, walker or w/c?: No Grab bars in the bathroom?: Yes Shower chair or bench in shower?: No Elevated toilet seat or a handicapped toilet?: Yes  TIMED UP AND GO:  Was the test performed?  No  Cognitive Function: 6CIT completed    08/20/2023    3:47 PM  MMSE - Mini Mental State Exam  Not completed: Unable to  complete        08/20/2023    3:46 PM 07/24/2022    1:30 PM  6CIT Screen  What Year? 0 points 0 points  What month? 0 points 0 points  What time? 0 points 0 points  Count back from 20 0 points 0 points  Months in reverse 0 points 0 points  Repeat phrase 0 points 0 points  Total Score 0 points 0 points    Immunizations  There is no immunization history on file for this patient.  Screening Tests Health Maintenance  Topic Date Due   COVID-19 Vaccine (1) Never done   Pneumonia Vaccine 21+ Years old (1 of 2 - PCV) Never done   OPHTHALMOLOGY EXAM  Never done   DTaP/Tdap/Td (1 - Tdap) Never done   Zoster Vaccines- Shingrix (1 of 2) Never done   Colonoscopy  Never done   Lung Cancer Screening  02/17/2007   INFLUENZA VACCINE  09/23/2023 (Originally 01/24/2023)   HEMOGLOBIN A1C  01/15/2024   Diabetic kidney evaluation - Urine ACR  03/13/2024   Diabetic kidney evaluation - eGFR measurement  03/15/2024   FOOT EXAM  07/17/2024   Medicare Annual Wellness (AWV)  08/19/2024   Hepatitis C Screening  Completed   HPV VACCINES  Aged Out    Health Maintenance  Health Maintenance Due  Topic Date Due   COVID-19 Vaccine (1) Never done   Pneumonia Vaccine 56+ Years old (1 of 2 - PCV) Never done   OPHTHALMOLOGY EXAM  Never done   DTaP/Tdap/Td (1 - Tdap) Never done  Zoster Vaccines- Shingrix (1 of 2) Never done   Colonoscopy  Never done   Lung Cancer Screening  02/17/2007   Health Maintenance Items Addressed: Yes Patient declined  all screenings and vaccinations during this AWV.  Additional Screening:  Vision Screening: Recommended annual ophthalmology exams for early detection of glaucoma and other disorders of the eye.  Dental Screening: Recommended annual dental exams for proper oral hygiene  Community Resource Referral / Chronic Care Management: CRR required this visit?  No   CCM required this visit?  No     Plan:     I have personally reviewed and noted the following in  the patient's chart:   Medical and social history Use of alcohol, tobacco or illicit drugs  Current medications and supplements including opioid prescriptions. Patient is not currently taking opioid prescriptions. Functional ability and status Nutritional status Physical activity Advanced directives List of other physicians Hospitalizations, surgeries, and ER visits in previous 12 months Vitals Screenings to include cognitive, depression, and falls Referrals and appointments  In addition, I have reviewed and discussed with patient certain preventive protocols, quality metrics, and best practice recommendations. A written personalized care plan for preventive services as well as general preventive health recommendations were provided to patient.     Mickeal Needy, LPN   [id number]   After Visit Summary: (MyChart) Due to this being a telephonic visit, the after visit summary with patients personalized plan was offered to patient via MyChart   Notes: Nothing significant to report at this time.

## 2023-09-26 ENCOUNTER — Telehealth: Payer: Self-pay | Admitting: Cardiovascular Disease

## 2023-09-26 MED ORDER — AMLODIPINE BESYLATE 10 MG PO TABS: ORAL_TABLET | ORAL | 2 refills | Status: DC

## 2023-09-26 NOTE — Telephone Encounter (Signed)
*  STAT* If patient is at the pharmacy, call can be transferred to refill team.   1. Which medications need to be refilled? (please list name of each medication and dose if known)   amLODipine (NORVASC) 10 MG tablet   2. Would you like to learn more about the convenience, safety, & potential cost savings by using the [facility]?   3. Are you open to using the [facility] (Type [facility]. ).  4. Which pharmacy/location (including street and city if local pharmacy) is medication to be sent to?  [id number] - [place], [place] - [address]   5. Do they need a 30 day or 90 day supply?  90 day  Wife Oscar Mills) stated patient is completely out of this medication.

## 2023-10-16 ENCOUNTER — Ambulatory Visit: Payer: Medicare HMO | Admitting: Nurse Practitioner

## 2023-10-17 DIAGNOSIS — M7912 Myalgia of auxiliary muscles, head and neck: Secondary | ICD-10-CM | POA: Diagnosis not present

## 2023-10-17 DIAGNOSIS — G2581 Restless legs syndrome: Secondary | ICD-10-CM | POA: Diagnosis not present

## 2023-10-17 DIAGNOSIS — M7918 Myalgia, other site: Secondary | ICD-10-CM | POA: Diagnosis not present

## 2023-10-17 DIAGNOSIS — Z79899 Other long term (current) drug therapy: Secondary | ICD-10-CM | POA: Diagnosis not present

## 2023-10-17 DIAGNOSIS — Z760 Encounter for issue of repeat prescription: Secondary | ICD-10-CM | POA: Diagnosis not present

## 2023-11-26 ENCOUNTER — Ambulatory Visit: Payer: Self-pay

## 2023-11-26 NOTE — Telephone Encounter (Signed)
 Chief Complaint: neck pain Frequency: "since that rainy spell" Pertinent Negatives: Patient denies stiffness, swelling, numbness, weakness, H/A, fever Disposition: [] ED /[] Urgent Care (no appt availability in office) / [x] Appointment(In office/virtual)/ []  [facility]/ [] Home Care/ [x] Refused Recommended Disposition /[] [facility]/ []  Follow-up with PCP Additional Notes: RN spoke to pt's wife Oscar Mills. Oscar Mills states pt is currently at home in a recliner. Oscar Mills states pt has had neck pain since it rained. Oscar Mills states pt has a hx of arthritis. Pt reports 8/10 neck pain. Pt is still able to turn his neck from side to side and touch his chest to his chest. Pt has been mowing frequently. Pt is able to walk. Denies weakness, numbness, tingling, swelling, SOB, fever, H/A. Pt has tried ice packs, heat, Aleve, and ibuprofen. Ibuprofen has been effective, but doesn't work well. RN advised pt he should be seen within 3 days and asked Oscar Mills if we could schedule the pt for tomorrow. Oscar Mills stated the pt will not feel like coming in tomorrow because his neck hurts more when he sits up and walks. Oscar Mills was hoping pain medication could be prescribed to him. RN advised Oscar Mills would relay need to office, also advised ED for worsening. Oscar Mills verbalized understanding.     Copied from CRM [id number]. Topic: Clinical - Red Word Triage >> Nov 26, 2023  2:55 PM Oscar Mills wrote: Red Word that prompted transfer to Nurse Triage: Patient is suffering from arthritis in his neck he has severe pain and thinks its from the bad weather. Reason for Disposition  [1] MODERATE neck pain (e.g., interferes with normal activities) AND [2] present > 3 days  Answer Assessment - Initial Assessment Questions 1. ONSET: "When did the pain begin?"      "After that rainy spell" 2. LOCATION: "Where does it hurt?"      Neck  3. PATTERN "Does the pain come and go, or has it been constant since it started?"      Comes and goes 4.  SEVERITY: "How bad is the pain?"  (Scale 1-10; or mild, moderate, severe)   - NO PAIN (0): no pain or only slight stiffness    - MILD (1-3): doesn't interfere with normal activities    - MODERATE (4-7): interferes with normal activities or awakens from sleep    - SEVERE (8-10):  excruciating pain, unable to do any normal activities      8/10 5. RADIATION: "Does the pain go anywhere else, shoot into your arms?"     No  6. CORD SYMPTOMS: "Any weakness or numbness of the arms or legs?"     No  7. CAUSE: "What do you think is causing the neck pain?"     Arthritis  8. NECK OVERUSE: "Any recent activities that involved turning or twisting the neck?"     "Been mowing and stuff, he had to get off the mower" 9. OTHER SYMPTOMS: "Do you have any other symptoms?" (e.g., headache, fever, chest pain, difficulty breathing, neck swelling)     Using heat, cold/ice pack, aleve no relief, ibuprofen helps "a little"  Denies fever/chills. Denies H/A. Denies swelling   Able to turn head, touch chin to chest   Hx arthritis in the neck  Protocols used: Neck Pain or Stiffness-A-AH

## 2023-12-20 DIAGNOSIS — Z681 Body mass index (BMI) 19 or less, adult: Secondary | ICD-10-CM | POA: Diagnosis not present

## 2023-12-20 DIAGNOSIS — J039 Acute tonsillitis, unspecified: Secondary | ICD-10-CM | POA: Diagnosis not present

## 2023-12-20 DIAGNOSIS — J019 Acute sinusitis, unspecified: Secondary | ICD-10-CM | POA: Diagnosis not present

## 2023-12-26 DIAGNOSIS — E109 Type 1 diabetes mellitus without complications: Secondary | ICD-10-CM | POA: Diagnosis not present

## 2023-12-26 DIAGNOSIS — R030 Elevated blood-pressure reading, without diagnosis of hypertension: Secondary | ICD-10-CM | POA: Diagnosis not present

## 2023-12-26 DIAGNOSIS — Z6824 Body mass index (BMI) 24.0-24.9, adult: Secondary | ICD-10-CM | POA: Diagnosis not present

## 2023-12-26 DIAGNOSIS — Z760 Encounter for issue of repeat prescription: Secondary | ICD-10-CM | POA: Diagnosis not present

## 2024-01-09 ENCOUNTER — Emergency Department
Admission: EM | Admit: 2024-01-09 | Discharge: 2024-01-09 | Disposition: A | Discharge status: Home / Self Care | Attending: Emergency Medicine | Admitting: Emergency Medicine

## 2024-01-09 ENCOUNTER — Other Ambulatory Visit: Payer: Self-pay

## 2024-01-09 ENCOUNTER — Emergency Department

## 2024-01-09 DIAGNOSIS — J90 Pleural effusion, not elsewhere classified: Secondary | ICD-10-CM | POA: Diagnosis not present

## 2024-01-09 DIAGNOSIS — I10 Essential (primary) hypertension: Secondary | ICD-10-CM | POA: Insufficient documentation

## 2024-01-09 DIAGNOSIS — R0602 Shortness of breath: Secondary | ICD-10-CM | POA: Diagnosis not present

## 2024-01-09 DIAGNOSIS — E119 Type 2 diabetes mellitus without complications: Secondary | ICD-10-CM | POA: Diagnosis not present

## 2024-01-09 DIAGNOSIS — Z87891 Personal history of nicotine dependence: Secondary | ICD-10-CM | POA: Insufficient documentation

## 2024-01-09 DIAGNOSIS — I700 Atherosclerosis of aorta: Secondary | ICD-10-CM | POA: Diagnosis not present

## 2024-01-09 LAB — BASIC METABOLIC PANEL WITH GFR
Anion gap: 9 (ref 5–15)
BUN: 17 mg/dL (ref 8–23)
CO2: 23 mmol/L (ref 22–32)
Calcium: 9.5 mg/dL (ref 8.9–10.3)
Chloride: 111 mmol/L (ref 98–111)
Creatinine, Ser: 0.99 mg/dL (ref 0.61–1.24)
GFR, Estimated: >60 mL/min (ref >60)
Glucose, Bld: 159 mg/dL — ABNORMAL HIGH (ref 70–99)
Potassium: 4.2 mmol/L (ref 3.5–5.1)
Sodium: 143 mmol/L (ref 135–145)

## 2024-01-09 LAB — CBC WITH DIFFERENTIAL/PLATELET
Abs Immature Granulocytes: 0.04 K/uL (ref 0.00–0.07)
Basophils Absolute: 0.1 K/uL (ref 0.0–0.1)
Basophils Relative: 1 %
Eosinophils Absolute: 0.1 K/uL (ref 0.0–0.5)
Eosinophils Relative: 1 %
HCT: 38.7 % — ABNORMAL LOW (ref 39.0–52.0)
Hemoglobin: 12.3 g/dL — ABNORMAL LOW (ref 13.0–17.0)
Immature Granulocytes: 0 %
Lymphocytes Relative: 19 %
Lymphs Abs: 2 K/uL (ref 0.7–4.0)
MCH: 30.6 pg (ref 26.0–34.0)
MCHC: 31.8 g/dL (ref 30.0–36.0)
MCV: 96.3 fL (ref 80.0–100.0)
Monocytes Absolute: 0.7 K/uL (ref 0.1–1.0)
Monocytes Relative: 7 %
Neutro Abs: 7.8 K/uL — ABNORMAL HIGH (ref 1.7–7.7)
Neutrophils Relative %: 72 %
Platelets: 299 K/uL (ref 150–400)
RBC: 4.02 MIL/uL — ABNORMAL LOW (ref 4.22–5.81)
RDW: 13.8 % (ref 11.5–15.5)
WBC: 10.7 K/uL — ABNORMAL HIGH (ref 4.0–10.5)
nRBC: 0 % (ref 0.0–0.2)

## 2024-01-09 LAB — BRAIN NATRIURETIC PEPTIDE: B Natriuretic Peptide: 48.8 pg/mL (ref 0.0–100.0)

## 2024-01-09 MED ORDER — ALBUTEROL SULFATE HFA 108 (90 BASE) MCG/ACT IN AERS
2.0000 | INHALATION_SPRAY | Freq: Four times a day (QID) | RESPIRATORY_TRACT | 2 refills | Status: DC | PRN
Start: 2024-01-09 — End: 2024-01-19

## 2024-01-09 NOTE — ED Triage Notes (Signed)
 Pt was dx with tonsillitis and URI 2 weeks ago, pt took dose of abx. Pt developed sob while laying flat x2 days. Pt denies chest pain

## 2024-01-09 NOTE — ED Provider Notes (Signed)
 [facility] Provider Note    Event Date/Time   First MD Initiated Contact with Patient 01/09/24 [phone]     (approximate)   History   No chief complaint on file.   HPI Oscar Mills is a 73 y.o. male with history of HTN, HLD, DM2 presenting today for shortness of breath.  Patient states recently having tonsillitis and URI treated with Augmentin .  Stated he finished the course of that was largely resolution of symptoms.  He is now intermittently having shortness of breath when he lays flat.  He denies any shortness of breath, cough, congestion when he is up ambulating.  Denies any chest pain.  Does have chronic smoking history.  No prior lung history that he is aware of.     Physical Exam   Triage Vital Signs: ED Triage Vitals  Encounter Vitals Group     BP 01/09/24 0323 127/64     Girls Systolic BP Percentile --      Girls Diastolic BP Percentile --      Boys Systolic BP Percentile --      Boys Diastolic BP Percentile --      Pulse Rate 01/09/24 0323 (!) 148     Resp 01/09/24 0323 (!) 22     Temp 01/09/24 0323 98.5 F (36.9 C)     Temp src --      SpO2 01/09/24 0323 92 %     Weight 01/09/24 0322 135 lb (61.2 kg)     Height 01/09/24 0322 6' 2 (1.88 m)     Head Circumference --      Peak Flow --      Pain Score 01/09/24 0322 0     Pain Loc --      Pain Education --      Exclude from Growth Chart --     Most recent vital signs: Vitals:   01/09/24 0323 01/09/24 0332  BP: 127/64   Pulse: (!) 148 80  Resp: (!) 22   Temp: 98.5 F (36.9 C)   SpO2: 92%    Physical Exam: I have reviewed the vital signs and nursing notes. General: Awake, alert, no acute distress.  Nontoxic appearing. Head:  Atraumatic, normocephalic.   ENT:  EOM intact, PERRL. Oral mucosa is pink and moist with no lesions. Neck: Neck is supple with full range of motion Cardiovascular:  RRR, No murmurs. Peripheral pulses palpable and equal bilaterally. Respiratory:   Symmetrical chest wall expansion.  No rhonchi, rales, or wheezes.  Good air movement throughout.  No use of accessory muscles.   Musculoskeletal:  No cyanosis or edema. Moving extremities with full ROM Abdomen:  Soft, nontender, nondistended. Neuro:  GCS 15, moving all four extremities, interacting appropriately. Speech clear. Psych:  Calm, appropriate.   Skin:  Warm, dry, no rash.    ED Results / Procedures / Treatments   Labs (all labs ordered are listed, but only abnormal results are displayed) Labs Reviewed  CBC WITH DIFFERENTIAL/PLATELET - Abnormal; Notable for the following components:      Result Value   WBC 10.7 (*)    RBC 4.02 (*)    Hemoglobin 12.3 (*)    HCT 38.7 (*)    Neutro Abs 7.8 (*)    All other components within normal limits  BASIC METABOLIC PANEL WITH GFR - Abnormal; Notable for the following components:   Glucose, Bld 159 (*)    All other components within normal limits  BRAIN NATRIURETIC PEPTIDE  EKG My EKG interpretation: Rate of 80, atrial fibrillation.  Normal axis, normal intervals.  No acute ST elevations or depressions   RADIOLOGY Independently interpreted chest x-ray with a right sided perihilar findings   PROCEDURES:  Critical Care performed: No  Procedures   MEDICATIONS ORDERED IN ED: Medications - No data to display   IMPRESSION / MDM / ASSESSMENT AND PLAN / ED COURSE  I reviewed the triage vital signs and the nursing notes.                              Differential diagnosis includes, but is not limited to, pneumonia, COPD, emphysema, lung cancer  Patient's presentation is most consistent with acute complicated illness / injury requiring diagnostic workup.  Patient is a 73 year old male presenting today for shortness of breath when lying flat.  He is asymptomatic when walking around.  No cough or congestion to obviously suggest infectious etiology and recently was on Augmentin .  Vital signs otherwise stable.  His exam shows  no wheezing at this time to suggest obvious COPD exacerbation.  Chest x-ray shows evidence of new perihilar infiltrates which could possibly represent mass versus lymphadenopathy.  Laboratory workup otherwise reassuring with CBC and BMP.  BNP negative.  I discussed chest x-ray findings and stated that next recommendation was to get a CT of his chest.  Patient and wife did not want to stay in the hospital any longer for CT and preferred to have this done outpatient with her primary care provider.  I discussed that this could represent something like cancer or other life-threatening pathology.  They understood this but stated they did not want to be in the hospital any longer for further imaging.  I recommend they call their PCP immediately to set this imaging up outpatient and was given strict return precautions.     FINAL CLINICAL IMPRESSION(S) / ED DIAGNOSES   Final diagnoses:  Shortness of breath     Rx / DC Orders   ED Discharge Orders          Ordered    albuterol  (VENTOLIN  HFA) 108 (90 Base) MCG/ACT inhaler  Every 6 hours PRN        01/09/24 0545             Note:  This document was prepared using Dragon voice recognition software and may include unintentional dictation errors.   Malvina Alm DASEN, MD 01/09/24 [phone]

## 2024-01-09 NOTE — Discharge Instructions (Signed)
 Please call your primary care provider immediately to set up your outpatient CT of your chest.  Chest x-ray had some concerning findings today that we cannot fully evaluate without additional workup.  Please return for any severe or worsening symptoms.

## 2024-01-15 ENCOUNTER — Telehealth: Payer: Self-pay | Admitting: Family Medicine

## 2024-01-15 NOTE — Telephone Encounter (Signed)
-----   Message from [facility] sent at 01/14/2024  5:15 PM EDT ----- Please schedule patient for ER follow-up. It is important he actually follow up.  Thanks! ----- Message ----- From: Malvina Alm DASEN, MD Sent: 01/09/2024   6:03 AM EDT To: Lauraine LOISE Buoy, DO  Hi, I saw this gentleman in the ED this morning.  His chest x-ray had some concerning findings along the right side of his lungs.  He and his wife were adamant they did not want to stay in the ED to get a CT even despite concerns that this could be a new mass.  I told him to call you but also wanted to make sure he was not lost to follow-up.  Thank you.

## 2024-01-15 NOTE — Telephone Encounter (Signed)
 Called and spoke to patient's wife Olam.    Patient was asleep due to tonsillitis per wife.   I advised her of him needing followup regarding a mass in found on his chest xray in the ER and made an appt for 02/07/24 @ 1:40 p.m. She said she would tell Raguel about the appt.

## 2024-01-19 ENCOUNTER — Inpatient Hospital Stay (HOSPITAL_COMMUNITY)
Admission: EM | Admit: 2024-01-19 | Discharge: 2024-01-24 | DRG: 988 | Disposition: E | Attending: Internal Medicine | Admitting: Internal Medicine

## 2024-01-19 ENCOUNTER — Encounter (HOSPITAL_COMMUNITY): Payer: Self-pay | Admitting: Emergency Medicine

## 2024-01-19 ENCOUNTER — Encounter (HOSPITAL_COMMUNITY): Payer: Self-pay

## 2024-01-19 ENCOUNTER — Other Ambulatory Visit: Payer: Self-pay

## 2024-01-19 ENCOUNTER — Emergency Department (HOSPITAL_COMMUNITY)

## 2024-01-19 DIAGNOSIS — E1142 Type 2 diabetes mellitus with diabetic polyneuropathy: Secondary | ICD-10-CM | POA: Diagnosis present

## 2024-01-19 DIAGNOSIS — D630 Anemia in neoplastic disease: Secondary | ICD-10-CM | POA: Diagnosis present

## 2024-01-19 DIAGNOSIS — I152 Hypertension secondary to endocrine disorders: Secondary | ICD-10-CM | POA: Diagnosis present

## 2024-01-19 DIAGNOSIS — I739 Peripheral vascular disease, unspecified: Secondary | ICD-10-CM | POA: Diagnosis present

## 2024-01-19 DIAGNOSIS — R531 Weakness: Secondary | ICD-10-CM | POA: Diagnosis not present

## 2024-01-19 DIAGNOSIS — J9602 Acute respiratory failure with hypercapnia: Secondary | ICD-10-CM | POA: Diagnosis present

## 2024-01-19 DIAGNOSIS — R221 Localized swelling, mass and lump, neck: Secondary | ICD-10-CM | POA: Diagnosis not present

## 2024-01-19 DIAGNOSIS — E1165 Type 2 diabetes mellitus with hyperglycemia: Secondary | ICD-10-CM | POA: Diagnosis not present

## 2024-01-19 DIAGNOSIS — E1159 Type 2 diabetes mellitus with other circulatory complications: Secondary | ICD-10-CM | POA: Diagnosis not present

## 2024-01-19 DIAGNOSIS — J449 Chronic obstructive pulmonary disease, unspecified: Secondary | ICD-10-CM | POA: Diagnosis not present

## 2024-01-19 DIAGNOSIS — R0602 Shortness of breath: Secondary | ICD-10-CM

## 2024-01-19 DIAGNOSIS — C801 Malignant (primary) neoplasm, unspecified: Secondary | ICD-10-CM | POA: Diagnosis not present

## 2024-01-19 DIAGNOSIS — J90 Pleural effusion, not elsewhere classified: Secondary | ICD-10-CM

## 2024-01-19 DIAGNOSIS — I6782 Cerebral ischemia: Secondary | ICD-10-CM | POA: Diagnosis not present

## 2024-01-19 DIAGNOSIS — I3139 Other pericardial effusion (noninflammatory): Secondary | ICD-10-CM | POA: Diagnosis not present

## 2024-01-19 DIAGNOSIS — N281 Cyst of kidney, acquired: Secondary | ICD-10-CM | POA: Diagnosis not present

## 2024-01-19 DIAGNOSIS — C3401 Malignant neoplasm of right main bronchus: Secondary | ICD-10-CM | POA: Diagnosis present

## 2024-01-19 DIAGNOSIS — C7901 Secondary malignant neoplasm of right kidney and renal pelvis: Secondary | ICD-10-CM | POA: Diagnosis present

## 2024-01-19 DIAGNOSIS — I96 Gangrene, not elsewhere classified: Secondary | ICD-10-CM | POA: Diagnosis not present

## 2024-01-19 DIAGNOSIS — J039 Acute tonsillitis, unspecified: Secondary | ICD-10-CM | POA: Diagnosis not present

## 2024-01-19 DIAGNOSIS — Z1152 Encounter for screening for COVID-19: Secondary | ICD-10-CM

## 2024-01-19 DIAGNOSIS — Z7189 Other specified counseling: Secondary | ICD-10-CM

## 2024-01-19 DIAGNOSIS — I871 Compression of vein: Secondary | ICD-10-CM | POA: Diagnosis present

## 2024-01-19 DIAGNOSIS — Z888 Allergy status to other drugs, medicaments and biological substances status: Secondary | ICD-10-CM

## 2024-01-19 DIAGNOSIS — F17210 Nicotine dependence, cigarettes, uncomplicated: Secondary | ICD-10-CM | POA: Diagnosis present

## 2024-01-19 DIAGNOSIS — R627 Adult failure to thrive: Secondary | ICD-10-CM | POA: Diagnosis present

## 2024-01-19 DIAGNOSIS — C770 Secondary and unspecified malignant neoplasm of lymph nodes of head, face and neck: Secondary | ICD-10-CM | POA: Diagnosis not present

## 2024-01-19 DIAGNOSIS — R591 Generalized enlarged lymph nodes: Secondary | ICD-10-CM | POA: Diagnosis not present

## 2024-01-19 DIAGNOSIS — R918 Other nonspecific abnormal finding of lung field: Secondary | ICD-10-CM | POA: Diagnosis not present

## 2024-01-19 DIAGNOSIS — R634 Abnormal weight loss: Secondary | ICD-10-CM | POA: Diagnosis present

## 2024-01-19 DIAGNOSIS — F1021 Alcohol dependence, in remission: Secondary | ICD-10-CM | POA: Diagnosis present

## 2024-01-19 DIAGNOSIS — R590 Localized enlarged lymph nodes: Secondary | ICD-10-CM | POA: Diagnosis present

## 2024-01-19 DIAGNOSIS — Z515 Encounter for palliative care: Secondary | ICD-10-CM

## 2024-01-19 DIAGNOSIS — C771 Secondary and unspecified malignant neoplasm of intrathoracic lymph nodes: Secondary | ICD-10-CM | POA: Diagnosis present

## 2024-01-19 DIAGNOSIS — E1151 Type 2 diabetes mellitus with diabetic peripheral angiopathy without gangrene: Secondary | ICD-10-CM | POA: Diagnosis present

## 2024-01-19 DIAGNOSIS — Z66 Do not resuscitate: Secondary | ICD-10-CM | POA: Diagnosis not present

## 2024-01-19 DIAGNOSIS — I6521 Occlusion and stenosis of right carotid artery: Secondary | ICD-10-CM | POA: Diagnosis not present

## 2024-01-19 DIAGNOSIS — R55 Syncope and collapse: Secondary | ICD-10-CM | POA: Diagnosis not present

## 2024-01-19 DIAGNOSIS — R911 Solitary pulmonary nodule: Secondary | ICD-10-CM | POA: Diagnosis not present

## 2024-01-19 DIAGNOSIS — J9601 Acute respiratory failure with hypoxia: Principal | ICD-10-CM | POA: Diagnosis present

## 2024-01-19 DIAGNOSIS — I959 Hypotension, unspecified: Secondary | ICD-10-CM | POA: Diagnosis not present

## 2024-01-19 DIAGNOSIS — J910 Malignant pleural effusion: Secondary | ICD-10-CM | POA: Diagnosis present

## 2024-01-19 DIAGNOSIS — N179 Acute kidney failure, unspecified: Secondary | ICD-10-CM | POA: Diagnosis present

## 2024-01-19 DIAGNOSIS — K219 Gastro-esophageal reflux disease without esophagitis: Secondary | ICD-10-CM | POA: Diagnosis present

## 2024-01-19 DIAGNOSIS — R0603 Acute respiratory distress: Secondary | ICD-10-CM | POA: Diagnosis not present

## 2024-01-19 DIAGNOSIS — E1122 Type 2 diabetes mellitus with diabetic chronic kidney disease: Secondary | ICD-10-CM | POA: Diagnosis present

## 2024-01-19 DIAGNOSIS — Z7401 Bed confinement status: Secondary | ICD-10-CM | POA: Diagnosis not present

## 2024-01-19 DIAGNOSIS — Z79899 Other long term (current) drug therapy: Secondary | ICD-10-CM

## 2024-01-19 DIAGNOSIS — J9811 Atelectasis: Secondary | ICD-10-CM | POA: Diagnosis present

## 2024-01-19 DIAGNOSIS — Z48813 Encounter for surgical aftercare following surgery on the respiratory system: Secondary | ICD-10-CM | POA: Diagnosis not present

## 2024-01-19 DIAGNOSIS — Z794 Long term (current) use of insulin: Secondary | ICD-10-CM

## 2024-01-19 DIAGNOSIS — Z681 Body mass index (BMI) 19 or less, adult: Secondary | ICD-10-CM

## 2024-01-19 DIAGNOSIS — R4182 Altered mental status, unspecified: Secondary | ICD-10-CM | POA: Diagnosis not present

## 2024-01-19 DIAGNOSIS — J441 Chronic obstructive pulmonary disease with (acute) exacerbation: Secondary | ICD-10-CM | POA: Diagnosis not present

## 2024-01-19 DIAGNOSIS — Z7902 Long term (current) use of antithrombotics/antiplatelets: Secondary | ICD-10-CM

## 2024-01-19 DIAGNOSIS — Z8249 Family history of ischemic heart disease and other diseases of the circulatory system: Secondary | ICD-10-CM

## 2024-01-19 DIAGNOSIS — R270 Ataxia, unspecified: Secondary | ICD-10-CM | POA: Diagnosis not present

## 2024-01-19 DIAGNOSIS — R0902 Hypoxemia: Secondary | ICD-10-CM | POA: Diagnosis present

## 2024-01-19 DIAGNOSIS — R079 Chest pain, unspecified: Secondary | ICD-10-CM | POA: Diagnosis not present

## 2024-01-19 DIAGNOSIS — I517 Cardiomegaly: Secondary | ICD-10-CM | POA: Diagnosis not present

## 2024-01-19 LAB — BLOOD GAS, VENOUS
Acid-base deficit: 3.6 mmol/L — ABNORMAL HIGH (ref 0.0–2.0)
Acid-base deficit: 2.8 mmol/L — ABNORMAL HIGH (ref 0.0–2.0)
Acid-base deficit: 4.2 mmol/L — ABNORMAL HIGH (ref 0.0–2.0)
Bicarbonate: 24.2 mmol/L (ref 20.0–28.0)
Bicarbonate: 25.4 mmol/L (ref 20.0–28.0)
Bicarbonate: 23.7 mmol/L (ref 20.0–28.0)
Drawn by: 42924
Drawn by: 27160
Drawn by: 42924
O2 Saturation: 74.7 %
O2 Saturation: 56.4 %
O2 Saturation: 73.5 %
Patient temperature: 36.7
Patient temperature: 36.7
Patient temperature: 36.4
pCO2, Ven: 53 mmHg (ref 44–60)
pCO2, Ven: 57 mmHg (ref 44–60)
pCO2, Ven: 53 mmHg (ref 44–60)
pH, Ven: 7.26 (ref 7.25–7.43)
pH, Ven: 7.25 (ref 7.25–7.43)
pH, Ven: 7.26 (ref 7.25–7.43)
pO2, Ven: 41 mmHg (ref 32–45)
pO2, Ven: <31 mmHg — CL (ref 32–45)
pO2, Ven: 39 mmHg (ref 32–45)

## 2024-01-19 LAB — CBG MONITORING, ED
Glucose-Capillary: 243 mg/dL — ABNORMAL HIGH (ref 70–99)
Glucose-Capillary: 268 mg/dL — ABNORMAL HIGH (ref 70–99)

## 2024-01-19 LAB — COMPREHENSIVE METABOLIC PANEL WITH GFR
ALT: 26 U/L (ref 0–44)
AST: 26 U/L (ref 15–41)
Albumin: 3.1 g/dL — ABNORMAL LOW (ref 3.5–5.0)
Alkaline Phosphatase: 104 U/L (ref 38–126)
Anion gap: 10 (ref 5–15)
BUN: 40 mg/dL — ABNORMAL HIGH (ref 8–23)
CO2: 21 mmol/L — ABNORMAL LOW (ref 22–32)
Calcium: 9 mg/dL (ref 8.9–10.3)
Chloride: 108 mmol/L (ref 98–111)
Creatinine, Ser: 1.77 mg/dL — ABNORMAL HIGH (ref 0.61–1.24)
GFR, Estimated: 41 mL/min — ABNORMAL LOW (ref >60)
Glucose, Bld: 264 mg/dL — ABNORMAL HIGH (ref 70–99)
Potassium: 4.3 mmol/L (ref 3.5–5.1)
Sodium: 139 mmol/L (ref 135–145)
Total Bilirubin: 0.3 mg/dL (ref 0.0–1.2)
Total Protein: 6.4 g/dL — ABNORMAL LOW (ref 6.5–8.1)

## 2024-01-19 LAB — BRAIN NATRIURETIC PEPTIDE: B Natriuretic Peptide: 100 pg/mL (ref 0.0–100.0)

## 2024-01-19 LAB — CBC WITH DIFFERENTIAL/PLATELET
Abs Immature Granulocytes: 0.03 K/uL (ref 0.00–0.07)
Basophils Absolute: 0 K/uL (ref 0.0–0.1)
Basophils Relative: 0 %
Eosinophils Absolute: 0 K/uL (ref 0.0–0.5)
Eosinophils Relative: 0 %
HCT: 39.1 % (ref 39.0–52.0)
Hemoglobin: 12.4 g/dL — ABNORMAL LOW (ref 13.0–17.0)
Immature Granulocytes: 0 %
Lymphocytes Relative: 13 %
Lymphs Abs: 1.3 K/uL (ref 0.7–4.0)
MCH: 31.2 pg (ref 26.0–34.0)
MCHC: 31.7 g/dL (ref 30.0–36.0)
MCV: 98.5 fL (ref 80.0–100.0)
Monocytes Absolute: 0.7 K/uL (ref 0.1–1.0)
Monocytes Relative: 7 %
Neutro Abs: 7.9 K/uL — ABNORMAL HIGH (ref 1.7–7.7)
Neutrophils Relative %: 80 %
Platelets: 325 K/uL (ref 150–400)
RBC: 3.97 MIL/uL — ABNORMAL LOW (ref 4.22–5.81)
RDW: 14.6 % (ref 11.5–15.5)
WBC: 10.1 K/uL (ref 4.0–10.5)
nRBC: 0 % (ref 0.0–0.2)

## 2024-01-19 LAB — RESP PANEL BY RT-PCR (RSV, FLU A&B, COVID)  RVPGX2
Influenza A by PCR: NEGATIVE
Influenza B by PCR: NEGATIVE
Resp Syncytial Virus by PCR: NEGATIVE
SARS Coronavirus 2 by RT PCR: NEGATIVE

## 2024-01-19 LAB — HEMOGLOBIN A1C
Hgb A1c MFr Bld: 10.4 % — ABNORMAL HIGH (ref 4.8–5.6)
Mean Plasma Glucose: 251.78 mg/dL

## 2024-01-19 LAB — TROPONIN I (HIGH SENSITIVITY)
Troponin I (High Sensitivity): 6 ng/L (ref <18)
Troponin I (High Sensitivity): 6 ng/L (ref <18)

## 2024-01-19 LAB — D-DIMER, QUANTITATIVE: D-Dimer, Quant: 1.12 ug{FEU}/mL — ABNORMAL HIGH (ref 0.00–0.50)

## 2024-01-19 LAB — GLUCOSE, CAPILLARY
Glucose-Capillary: 209 mg/dL — ABNORMAL HIGH (ref 70–99)
Glucose-Capillary: 166 mg/dL — ABNORMAL HIGH (ref 70–99)

## 2024-01-19 LAB — MRSA NEXT GEN BY PCR, NASAL: MRSA by PCR Next Gen: NOT DETECTED

## 2024-01-19 MED ORDER — TRAZODONE HCL 50 MG PO TABS: 50.0000 mg | ORAL_TABLET | Freq: Every evening | ORAL | Status: DC | PRN

## 2024-01-19 MED ORDER — ORAL CARE MOUTH RINSE: 15.0000 mL | OROMUCOSAL | Status: DC | PRN

## 2024-01-19 MED ORDER — SODIUM CHLORIDE 0.9% FLUSH
3.0000 mL | Freq: Two times a day (BID) | INTRAVENOUS | Status: DC
  Administered 2024-01-19 – 2024-01-21 (×5): 3 mL via INTRAVENOUS

## 2024-01-19 MED ORDER — INSULIN ASPART 100 UNIT/ML IJ SOLN
0.0000 [IU] | Freq: Every day | INTRAMUSCULAR | Status: DC
  Administered 2024-01-21 – 2024-01-22 (×2): 2 [IU] via SUBCUTANEOUS

## 2024-01-19 MED ORDER — BISACODYL 10 MG RE SUPP: 10.0000 mg | Freq: Every day | RECTAL | Status: DC | PRN

## 2024-01-19 MED ORDER — SODIUM CHLORIDE 0.9 % IV SOLN: INTRAVENOUS | Status: AC

## 2024-01-19 MED ORDER — ORAL CARE MOUTH RINSE
15.0000 mL | OROMUCOSAL | Status: DC
  Administered 2024-01-19 – 2024-01-21 (×4): 15 mL via OROMUCOSAL

## 2024-01-19 MED ORDER — INSULIN ASPART 100 UNIT/ML IJ SOLN
0.0000 [IU] | Freq: Three times a day (TID) | INTRAMUSCULAR | Status: DC
  Administered 2024-01-19: 8 [IU] via SUBCUTANEOUS
  Administered 2024-01-20 – 2024-01-21 (×2): 2 [IU] via SUBCUTANEOUS
  Administered 2024-01-21: 3 [IU] via SUBCUTANEOUS
  Administered 2024-01-22: 15 [IU] via SUBCUTANEOUS
  Administered 2024-01-22: 5 [IU] via SUBCUTANEOUS
  Administered 2024-01-22: 8 [IU] via SUBCUTANEOUS
  Administered 2024-01-23: 2 [IU] via SUBCUTANEOUS
  Filled 2024-01-19: qty 1

## 2024-01-19 MED ORDER — INSULIN GLARGINE-YFGN 100 UNIT/ML ~~LOC~~ SOLN
20.0000 [IU] | Freq: Every day | SUBCUTANEOUS | Status: DC
  Administered 2024-01-19 – 2024-01-20 (×2): 20 [IU] via SUBCUTANEOUS
  Filled 2024-01-19 (×3): qty 0.2

## 2024-01-19 MED ORDER — LACTATED RINGERS IV BOLUS
1000.0000 mL | Freq: Once | INTRAVENOUS | Status: AC
  Administered 2024-01-19: 1000 mL via INTRAVENOUS

## 2024-01-19 MED ORDER — ASPIRIN 81 MG PO TBEC
81.0000 mg | DELAYED_RELEASE_TABLET | Freq: Every day | ORAL | Status: DC
  Administered 2024-01-21 – 2024-01-23 (×3): 81 mg via ORAL
  Filled 2024-01-19 (×3): qty 1

## 2024-01-19 MED ORDER — AZITHROMYCIN 250 MG PO TABS: 500.0000 mg | ORAL_TABLET | Freq: Every day | ORAL | Status: DC

## 2024-01-19 MED ORDER — ONDANSETRON HCL 4 MG/2ML IJ SOLN: 4.0000 mg | Freq: Four times a day (QID) | INTRAMUSCULAR | Status: DC | PRN

## 2024-01-19 MED ORDER — SODIUM CHLORIDE 0.9% FLUSH: 3.0000 mL | INTRAVENOUS | Status: DC | PRN

## 2024-01-19 MED ORDER — INSULIN ASPART 100 UNIT/ML IJ SOLN
3.0000 [IU] | Freq: Three times a day (TID) | INTRAMUSCULAR | Status: DC
  Administered 2024-01-20: 3 [IU] via SUBCUTANEOUS
  Filled 2024-01-19: qty 1

## 2024-01-19 MED ORDER — PANTOPRAZOLE SODIUM 40 MG PO TBEC
40.0000 mg | DELAYED_RELEASE_TABLET | Freq: Every day | ORAL | Status: DC
  Administered 2024-01-21 – 2024-01-23 (×3): 40 mg via ORAL
  Filled 2024-01-19 (×3): qty 1

## 2024-01-19 MED ORDER — SODIUM CHLORIDE 0.9% FLUSH
3.0000 mL | Freq: Two times a day (BID) | INTRAVENOUS | Status: DC
  Administered 2024-01-19 – 2024-01-22 (×6): 3 mL via INTRAVENOUS

## 2024-01-19 MED ORDER — POLYETHYLENE GLYCOL 3350 17 G PO PACK: 17.0000 g | PACK | Freq: Every day | ORAL | Status: DC | PRN

## 2024-01-19 MED ORDER — ALBUTEROL SULFATE (2.5 MG/3ML) 0.083% IN NEBU: 2.5000 mg | INHALATION_SOLUTION | RESPIRATORY_TRACT | Status: DC | PRN

## 2024-01-19 MED ORDER — DIPHENHYDRAMINE HCL 25 MG PO CAPS: 50.0000 mg | ORAL_CAPSULE | Freq: Once | ORAL | Status: AC

## 2024-01-19 MED ORDER — DM-GUAIFENESIN ER 30-600 MG PO TB12
1.0000 | ORAL_TABLET | Freq: Two times a day (BID) | ORAL | Status: DC
  Administered 2024-01-20 – 2024-01-23 (×5): 1 via ORAL
  Filled 2024-01-19 (×5): qty 1

## 2024-01-19 MED ORDER — METHYLPREDNISOLONE SODIUM SUCC 40 MG IJ SOLR
40.0000 mg | Freq: Once | INTRAMUSCULAR | Status: AC
  Administered 2024-01-19: 40 mg via INTRAVENOUS
  Filled 2024-01-19: qty 1

## 2024-01-19 MED ORDER — ROSUVASTATIN CALCIUM 10 MG PO TABS
10.0000 mg | ORAL_TABLET | Freq: Every day | ORAL | Status: DC
  Administered 2024-01-21 – 2024-01-23 (×3): 10 mg via ORAL
  Filled 2024-01-19 (×3): qty 1

## 2024-01-19 MED ORDER — DIPHENHYDRAMINE HCL 50 MG/ML IJ SOLN
50.0000 mg | Freq: Once | INTRAMUSCULAR | Status: AC
  Administered 2024-01-19: 50 mg via INTRAVENOUS
  Filled 2024-01-19: qty 1

## 2024-01-19 MED ORDER — IOHEXOL 350 MG/ML SOLN
60.0000 mL | Freq: Once | INTRAVENOUS | Status: AC | PRN
  Administered 2024-01-19: 60 mL via INTRAVENOUS

## 2024-01-19 MED ORDER — SODIUM CHLORIDE 0.9 % IV SOLN: INTRAVENOUS | Status: AC | PRN

## 2024-01-19 MED ORDER — ACETAMINOPHEN 325 MG PO TABS: 650.0000 mg | ORAL_TABLET | Freq: Four times a day (QID) | ORAL | Status: DC | PRN

## 2024-01-19 MED ORDER — CHLORHEXIDINE GLUCONATE CLOTH 2 % EX PADS
6.0000 | MEDICATED_PAD | Freq: Every day | CUTANEOUS | Status: DC
  Administered 2024-01-20 – 2024-01-22 (×3): 6 via TOPICAL

## 2024-01-19 MED ORDER — ONDANSETRON HCL 4 MG PO TABS: 4.0000 mg | ORAL_TABLET | Freq: Four times a day (QID) | ORAL | Status: DC | PRN

## 2024-01-19 MED ORDER — GABAPENTIN 400 MG PO CAPS
1200.0000 mg | ORAL_CAPSULE | Freq: Three times a day (TID) | ORAL | Status: DC
  Administered 2024-01-20: 1200 mg via ORAL
  Filled 2024-01-19: qty 3

## 2024-01-19 MED ORDER — IPRATROPIUM-ALBUTEROL 0.5-2.5 (3) MG/3ML IN SOLN
3.0000 mL | Freq: Three times a day (TID) | RESPIRATORY_TRACT | Status: DC
  Administered 2024-01-19 – 2024-01-23 (×12): 3 mL via RESPIRATORY_TRACT
  Filled 2024-01-19 (×13): qty 3

## 2024-01-19 MED ORDER — ACETAMINOPHEN 650 MG RE SUPP
650.0000 mg | Freq: Four times a day (QID) | RECTAL | Status: DC | PRN
Start: 2024-01-19 — End: 2024-01-23

## 2024-01-19 MED ORDER — NICOTINE 21 MG/24HR TD PT24
21.0000 mg | MEDICATED_PATCH | Freq: Every day | TRANSDERMAL | Status: DC
  Administered 2024-01-19 – 2024-01-23 (×5): 21 mg via TRANSDERMAL
  Filled 2024-01-19 (×5): qty 1

## 2024-01-19 MED ORDER — METHYLPREDNISOLONE SODIUM SUCC 40 MG IJ SOLR
40.0000 mg | Freq: Two times a day (BID) | INTRAMUSCULAR | Status: DC
  Administered 2024-01-19 – 2024-01-20 (×2): 40 mg via INTRAVENOUS
  Filled 2024-01-19 (×2): qty 1

## 2024-01-19 MED ORDER — HEPARIN SODIUM (PORCINE) 5000 UNIT/ML IJ SOLN
5000.0000 [IU] | Freq: Three times a day (TID) | INTRAMUSCULAR | Status: DC
  Administered 2024-01-19 – 2024-01-23 (×12): 5000 [IU] via SUBCUTANEOUS
  Filled 2024-01-19 (×11): qty 1

## 2024-01-19 MED ORDER — SODIUM CHLORIDE 0.9 % IV SOLN
500.0000 mg | INTRAVENOUS | Status: DC
  Administered 2024-01-19 – 2024-01-20 (×2): 500 mg via INTRAVENOUS
  Filled 2024-01-19 (×2): qty 5

## 2024-01-19 NOTE — Progress Notes (Signed)
 1242 Patient transported to and from CT on BiPAP without incident.

## 2024-01-19 NOTE — ED Provider Notes (Addendum)
 [name] Provider Note   CSN: [id number] Arrival date & time: 01/19/24  9291     Patient presents with: Respiratory Distress (Wife states rx ammoxicillin 2 weeks ago for tonsillitis, took 7 of 10 days abx, went to [facility] for diff breathing approx 1 week ago, wife suspects abx reaction)   Oscar Mills is a 73 y.o. male.   HPI    73 year old male with history of PAD, tobacco use disorder, diabetes, hypertension comes in with chief complaint of progressively worsening shortness of breath.  Patient's wife is at bedside.  She states that about 2 weeks ago, patient was diagnosed with tonsillitis.  He was put on Augmentin .  However, patient's neck swelling has persisted.  She also has noted worsening shortness of breath.  Patient was taken to [facility] emergency room.  While there, patient had an x-ray, he was put on inhalers and discharged.  In the last 2 days, patient's shortness of breath has worsened.  Yesterday he was more confused.  She is also noticed that when he walks, he is staggering more towards the left side.  Patient additionally is unable to lay flat, due to his worsening shortness of breath.  Patient has 40-pack-year tobacco use history. He does not have any underlying primary cardiac or lung disease history.  Prior to Admission medications   Medication Sig Start Date End Date Taking? Authorizing Provider  acetaminophen  (TYLENOL ) 500 MG tablet Take 1,000 mg by mouth every 6 (six) hours as needed for moderate pain.   Yes [provider]  albuterol  (VENTOLIN  HFA) 108 (90 Base) MCG/ACT inhaler Inhale 2 puffs into the lungs every 6 (six) hours as needed for wheezing or shortness of breath. 01/09/24  Yes Malvina Alm DASEN, MD  amLODipine  (NORVASC ) 10 MG tablet TAKE 1 TABLET(10 MG) BY MOUTH DAILY 09/26/23  Yes Darron Deatrice LABOR, MD  clopidogrel  (PLAVIX ) 75 MG tablet Take 1 tablet (75 mg total) by mouth daily.  07/19/22  Yes Darron Deatrice LABOR, MD  gabapentin  (NEURONTIN ) 600 MG tablet Take 1,200 mg by mouth 3 (three) times daily. 12/22/23  Yes [provider]  ibuprofen (ADVIL) 200 MG tablet Take 200 mg by mouth every 6 (six) hours as needed for mild pain (pain score 1-3).   Yes [provider]  insulin  glargine (LANTUS  SOLOSTAR) 100 UNIT/ML Solostar Pen Inject 30 Units into the skin at bedtime. Check fasting blood sugar every morning; titrate dose by 2 units every 3 days to meet fasting blood sugar goal of <150. Start with 20 units and titrate to 50 units (as estimated max dose) 07/18/23  Yes Reardon, Benton J, NP  ondansetron  (ZOFRAN -ODT) 4 MG disintegrating tablet DISSOLVE 1 TABLET(4 MG) ON THE TONGUE EVERY 8 HOURS AS NEEDED FOR NAUSEA OR VOMITING 04/03/23  Yes Pardue, Lauraine SAILOR, DO  pantoprazole  (PROTONIX ) 40 MG tablet Take 1 tablet (40 mg total) by mouth daily. 08/20/23  Yes Darron Deatrice LABOR, MD  rosuvastatin  (CRESTOR ) 10 MG tablet TAKE 1 TABLET(10 MG) BY MOUTH DAILY 08/12/23  Yes Darron Deatrice LABOR, MD  Blood Glucose Monitoring Suppl DEVI 1 each by Does not apply route in the morning and at bedtime. May substitute to any manufacturer covered by patient's insurance. 03/15/23   Emilio Kelly DASEN, FNP  Continuous Glucose Receiver (FREESTYLE LIBRE 3 READER) DEVI 1 each by Does not apply route once for 1 dose. 04/11/23 04/11/23  Donzella Lauraine SAILOR, DO  Continuous Glucose Sensor (FREESTYLE LIBRE 3  SENSOR) MISC 1 each by Does not apply route every 14 (fourteen) days. Place 1 sensor on the skin every 14 days. Use to check glucose continuously Patient not taking: Reported on 07/18/2023 04/19/23   Donzella Lauraine SAILOR, DO  glucose blood (GNP TRUE METRIX GLUCOSE STRIPS) test strip Use as instructed to monitor glucose twice daily 07/18/23   Therisa Benton PARAS, NP  Insulin  Pen Needle (PEN NEEDLES) 31G X 5 MM MISC Use to inject insulin  once daily 07/18/23   Therisa Benton PARAS, NP  Lancets MISC Use to check glucose twice  daily 07/18/23   Therisa Benton PARAS, NP    Allergies: Iohexol     Review of Systems  All other systems reviewed and are negative.   Updated Vital Signs BP (!) 117/56   Pulse 72   Temp 97.7 F (36.5 C) (Axillary)   Resp (!) 21   Ht 6' 2 (1.88 m)   Wt 61.2 kg   SpO2 97%   BMI 17.32 kg/m   Physical Exam Vitals and nursing note reviewed.  Constitutional:      Appearance: He is well-developed.  HENT:     Head: Normocephalic and atraumatic.  Eyes:     Extraocular Movements: Extraocular movements intact.     Pupils: Pupils are equal, round, and reactive to light.     Comments: No nystagmus, peripheral visual field intact  Neck:     Vascular: No JVD.     Comments: Left-sided facial swelling on the mandible noted Cardiovascular:     Rate and Rhythm: Normal rate.  Pulmonary:     Effort: Pulmonary effort is normal.     Breath sounds: Normal breath sounds.     Comments: Slightly diminished breath sounds on the left compared to right at the base Abdominal:     General: Bowel sounds are normal. There is no distension.     Palpations: Abdomen is soft.     Tenderness: There is no abdominal tenderness.  Musculoskeletal:     Cervical back: Neck supple.     Comments: Mild bilateral lower extremity pitting edema at the ankle  Skin:    General: Skin is warm and dry.  Neurological:     Mental Status: He is alert.     Cranial Nerves: No cranial nerve deficit.     Coordination: Coordination normal.     Comments:  No objective sensory deficits, Motor strength -appears that patient unable to participate well.  There is no focal weakness noted No dysmetria with finger-to-nose     (all labs ordered are listed, but only abnormal results are displayed) Labs Reviewed  COMPREHENSIVE METABOLIC PANEL WITH GFR - Abnormal; Notable for the following components:      Result Value   CO2 21 (*)    Glucose, Bld 264 (*)    BUN 40 (*)    Creatinine, Ser 1.77 (*)    Total Protein 6.4 (*)     Albumin 3.1 (*)    GFR, Estimated 41 (*)    All other components within normal limits  BLOOD GAS, VENOUS - Abnormal; Notable for the following components:   Acid-base deficit 3.6 (*)    All other components within normal limits  CBC WITH DIFFERENTIAL/PLATELET - Abnormal; Notable for the following components:   RBC 3.97 (*)    Hemoglobin 12.4 (*)    Neutro Abs 7.9 (*)    All other components within normal limits  D-DIMER, QUANTITATIVE - Abnormal; Notable for the following components:   D-Dimer, Quant  1.12 (*)    All other components within normal limits  BLOOD GAS, VENOUS - Abnormal; Notable for the following components:   pO2, Ven <31 (*)    Acid-base deficit 2.8 (*)    All other components within normal limits  BLOOD GAS, VENOUS - Abnormal; Notable for the following components:   Acid-base deficit 4.2 (*)    All other components within normal limits  CBG MONITORING, ED - Abnormal; Notable for the following components:   Glucose-Capillary 243 (*)    All other components within normal limits  RESP PANEL BY RT-PCR (RSV, FLU A&B, COVID)  RVPGX2  BRAIN NATRIURETIC PEPTIDE  CBG MONITORING, ED  TROPONIN I (HIGH SENSITIVITY)  TROPONIN I (HIGH SENSITIVITY)    EKG: EKG Interpretation Date/Time:  Sunday January 19 2024 07:17:01 EDT Ventricular Rate:  71 PR Interval:    QRS Duration:  109 QT Interval:  398 QTC Calculation: 433 R Axis:   114  Text Interpretation: Atrial flutter vs. sinus Right axis deviation Low voltage, precordial leads Borderline T abnormalities, inferior leads Borderline ST elevation, lateral leads Baseline wander in lead(s) V2 Confirmed by Charlyn Sora [phone]) on 01/19/2024 8:41:17 AM  Radiology: CT Angio Chest PE W and/or Wo Contrast Result Date: 01/19/2024 CLINICAL DATA:  Pulmonary embolism (PE) suspected, low to intermediate prob, positive D-dimer EXAM: CT ANGIOGRAPHY CHEST WITH CONTRAST TECHNIQUE: Multidetector CT imaging of the chest was performed using the  standard protocol during bolus administration of intravenous contrast. Multiplanar CT image reconstructions and MIPs were obtained to evaluate the vascular anatomy. RADIATION DOSE REDUCTION: This exam was performed according to the departmental dose-optimization program which includes automated exposure control, adjustment of the mA and/or kV according to patient size and/or use of iterative reconstruction technique. CONTRAST:  60mL OMNIPAQUE  IOHEXOL  350 MG/ML SOLN COMPARISON:  CT abdomen 01/19/2022, chest 02/16/2006 FINDINGS: Cardiovascular: SVC narrowing by mediastinal mass, without significant collateral filling. Heart size normal. Moderate pericardial effusion. Satisfactory opacification of pulmonary arteries noted, and there is no evidence of pulmonary emboli. Mild left coronary calcifications. Adequate contrast opacification of the thoracic aorta with no evidence of dissection, aneurysm, or stenosis. There is classic 3-vessel brachiocephalic arch anatomy without proximal stenosis. Scattered calcified plaque in the arch and descending thoracic aorta. Mediastinum/Nodes: Bulky bilateral supraclavicular adenopathy. Confluent anterior mediastinal and right paratracheal mass. Subcarinal and pre-vascular adenopathy. Confluent right hilar mass or adenopathy, encasing branches of the right upper lobe pulmonary artery. Mild left axillary adenopathy. Lungs/Pleura: Moderate bilateral pleural effusions. Atelectasis of most of the left lobe, and dependent aspects of lingula in right lower lobe. Scattered bilateral pulmonary nodules, largest on the left none mm subpleural in the upper lobe (7:91) , on the right in the superior segment lower lobe 7 mm (110:7) . no pneumothorax. Upper Abdomen: Left para-aortic adenopathy up to 1.7 cm. Extensive coarse calcifications in the pancreatic body and tail suggesting chronic pancreatitis. 7.4 cm complex cystic mass at the right renal hilum, previously 6 cm. Musculoskeletal: Bilateral  shoulder DJD. Vertebral endplate spurring at multiple levels in the mid thoracic spine. Review of the MIP images confirms the above findings. IMPRESSION: 1. Negative for acute PE or thoracic aortic dissection. 2. Bulky supraclavicular, mediastinal, and right hilar adenopathy, with SVC narrowing. Metastatic disease or lymphoma primary considerations. Follow-up recommended. 3. New moderate bilateral pleural effusions with left greater than right atelectasis. 4. Increased moderate pericardial effusion. 5. 7.4 cm complex cystic mass at the right renal hilum, previously 6 cm. 6.  Aortic Atherosclerosis (ICD10-I70.0). Electronically Signed  By: JONETTA Faes M.D.   On: 01/19/2024 13:41   CT Soft Tissue Neck W Contrast Result Date: 01/19/2024 CLINICAL DATA:  Left neck soft tissue mass. Recent antibiotic treatment for tonsillitis. Difficulty breathing. Hypoxia. EXAM: CT NECK WITH CONTRAST TECHNIQUE: Multidetector CT imaging of the neck was performed using the standard protocol following the bolus administration of intravenous contrast. RADIATION DOSE REDUCTION: This exam was performed according to the departmental dose-optimization program which includes automated exposure control, adjustment of the mA and/or kV according to patient size and/or use of iterative reconstruction technique. CONTRAST:  60mL OMNIPAQUE  IOHEXOL  350 MG/ML SOLN COMPARISON:  None Available. FINDINGS: Pharynx and larynx: Mildly limited assessment due to mild motion artifact. Symmetric tonsils without substantial enlargement. Normal epiglottis. Patent airway. Small amount of low-density, mildly nodular material posterolateral E on the right in the subglottic larynx and proximal trachea (series 3, images 88 and 93) individually measuring up to 1.5 cm in craniocaudal dimension. Mild retropharyngeal edema or minimal fluid without organized retropharyngeal or peritonsillar fluid collection. Salivary glands: No inflammation, mass, or stone. Thyroid :  Unremarkable. Lymph nodes: Necrotic lymphadenopathy in the neck bilaterally including conglomerate bilateral level IV/supraclavicular nodal masses measuring 5.0 x 4.3 x 6.5 cm on the right and 4.6 x 3.6 x 6.6 cm on the left. Additional smaller pathologic lymph nodes more superiorly in the neck bilaterally including a 1.7 cm short axis necrotic left submandibular node. Vascular: The internal jugular veins are compressed in the lower neck by the nodal masses but are patent more superiorly in the neck. Calcified atherosclerosis in the carotid bulbs with at least a moderate stenosis of the proximal right ICA. Limited intracranial: Unremarkable. Visualized orbits: Bilateral cataract extraction. Mastoids and visualized paranasal sinuses: Minimal mucosal thickening in the paranasal sinuses. Clear mastoid air cells. Skeleton: Moderate cervical spondylosis.  Dental caries. Upper chest: More fully evaluated on the separately reported contemporaneous CT of the chest. Other: Relatively widespread soft tissue swelling in the left greater than right neck, nonspecific but may be secondary to venous obstruction in the superior mediastinum. IMPRESSION: 1. Necrotic lymphadenopathy in the neck bilaterally consistent with metastatic disease. See the separately reported chest CT for thoracic findings. 2. Small amount of low-density material in the subglottic larynx and proximal trachea, indeterminate for adherent mucus or mass. Correlate with direct visualization. 3. Diffuse soft tissue swelling in the neck which may be secondary to venous obstruction. Electronically Signed   By: Dasie Hamburg M.D.   On: 01/19/2024 13:31   CT Head Wo Contrast Result Date: 01/19/2024 CLINICAL DATA:  Altered mental status.  Ataxia. EXAM: CT HEAD WITHOUT CONTRAST TECHNIQUE: Contiguous axial images were obtained from the base of the skull through the vertex without intravenous contrast. RADIATION DOSE REDUCTION: This exam was performed according to the  departmental dose-optimization program which includes automated exposure control, adjustment of the mA and/or kV according to patient size and/or use of iterative reconstruction technique. COMPARISON:  None Available. FINDINGS: Brain: There is no evidence of an acute infarct, intracranial hemorrhage, mass, midline shift, or extra-axial fluid collection. There is mild cerebral atrophy. Periventricular white matter hypodensities are nonspecific but compatible with mild chronic small vessel ischemic disease. Vascular: Calcified atherosclerosis at the skull base. No hyperdense vessel. Skull: No acute fracture or suspicious lesion. Sinuses/Orbits: Minimal mucosal thickening in the paranasal sinuses. Clear mastoid air cells. Bilateral cataract extraction. Other: None. IMPRESSION: 1. No evidence of acute intracranial abnormality. 2. Mild chronic small vessel ischemic disease. Electronically Signed   By: Dasie Hamburg  M.D.   On: 01/19/2024 13:15   DG Chest Port 1 View Result Date: 01/19/2024 CLINICAL DATA:  Chest pain EXAM: PORTABLE CHEST 1 VIEW COMPARISON:  01/09/2024 FINDINGS: Bibasilar collapse/consolidation with moderate left and small right pleural effusions. Interstitial markings are diffusely coarsened with chronic features. Persistent right paratracheal and right mediastinal/hilar fullness. No acute bony abnormality. Telemetry leads overlie the chest. IMPRESSION: 1. Bibasilar collapse/consolidation with moderate left and small right pleural effusions, progressive in the interval since prior study. 2. Persistent right paratracheal and right mediastinal/hilar fullness. CT chest with contrast recommended to further evaluate. Electronically Signed   By: Camellia Candle M.D.   On: 01/19/2024 07:59     .Critical Care  Performed by: Charlyn Sora, MD Authorized by: Charlyn Sora, MD   Critical care provider statement:    Critical care time (minutes):  119   Critical care time was exclusive of:  Separately  billable procedures and treating other patients   Critical care was necessary to treat or prevent imminent or life-threatening deterioration of the following conditions:  Respiratory failure and CNS failure or compromise   Critical care was time spent personally by me on the following activities:  Development of treatment plan with patient or surrogate, discussions with consultants, evaluation of patient's response to treatment, examination of patient, ordering and review of laboratory studies, ordering and review of radiographic studies, ordering and performing treatments and interventions, pulse oximetry, re-evaluation of patient's condition, review of old charts and obtaining history from patient or surrogate    Medications Ordered in the ED  lactated ringers  bolus 1,000 mL (has no administration in time range)  methylPREDNISolone  sodium succinate (SOLU-MEDROL ) 40 mg/mL injection 40 mg (40 mg Intravenous Given 01/19/24 0833)  diphenhydrAMINE  (BENADRYL ) capsule 50 mg ( Oral See Alternative 01/19/24 1142)    Or  diphenhydrAMINE  (BENADRYL ) injection 50 mg (50 mg Intravenous Given 01/19/24 1142)  iohexol  (OMNIPAQUE ) 350 MG/ML injection 60 mL (60 mLs Intravenous Contrast Given 01/19/24 1236)    Clinical Course as of 01/19/24 1621  Sun Jan 19, 2024  1151 Blood gas, venous (at [facility] and [facility])(!!) Patient placed on BiPAP.  Repeat venous blood gas shows that the pH has worsened slightly to 7.25.  pCO2 is also worsened slightly, increased to 57 from 54.  RT to make some additional adjustments.  I had gone to reassess the patient on 2 separate occasions.  He is unchanged.  He is getting prepped for the CT scan, which is scheduled for 1230.  Wife was not at the bedside.  Nursing staff made me aware that the wife will return after taking care of the dogs.  I did call the phone number, but the call went to voicemail. [AN]  1347 CT scan is concerning for malignancy in the chest, mediastinal, paratracheal region and  neck.  Results of the ED discussed with the patient.  Will proceed with admission request. [AN]  1602 Patient was admitted to medicine service.  With the plan for patient to be transferred to [facility].  I discussed the findings with the family.  I answered all her questions.  Family thereafter indicated that they would like the patient to be transferred to [facility].  We reinitiated conversations with [facility] and I spoke with Dr. Laurita, who will put admission orders for the patient.  Patient is on BiPAP.  He stable for transfer. [AN]  1621 [place] [facility] team, Dr. Sherre indicated that she spoke with oncology service at [facility].  If patient needs stenting, they  will not have capability of managing it.  They recommend admission to [facility].  This dilemma discussed with the family and they are now okay with transfer to [facility], but requesting transfer to [facility] regional as soon as possible. [AN]    Clinical Course User Index [AN] Charlyn Sora, MD                                 Medical Decision Making Amount and/or Complexity of Data Reviewed Labs: ordered. Decision-making details documented in ED Course. Radiology: ordered.  Risk Prescription drug management. Decision regarding hospitalization.   This patient presents to the ED with chief complaint(s) of worsening shortness of breath, confusion with pertinent past medical history of PAD, diabetes, hypertension.The complaint involves an extensive differential diagnosis and also carries with it a high risk of complications and morbidity.    The differential diagnosis includes : Pneumonia, pleural effusion, neck cancer, lung cancer, pulmonary embolism, hypercapnic respiratory failure leading to confusion, severe anemia, electrolyte abnormality, dehydration. Stroke also considered in the differential, given patient's wife indicates that when he walks, he is staggering more left side.  The initial plan is to get  basic labs. X-ray of the chest, COVID test.  I will review patient's recent visits and encounters.   Additional history obtained: Additional history obtained from spouse Records reviewed x-ray from recent ER visit reviewed.  It appears that at that time, the recommendation was to get CT scan, but family has decided against waiting.  Independent labs interpretation:  The following labs were independently interpreted: Patient is noted to have hypercapnic respiratory failure with PCO2 at 56, pH is 7.26.  Although he is altered, he is following commands.  It might be best to put him on BiPAP, as his confusion likely is related to hypercapnia.  Additionally patient is noted to be hypoxic, with some help with work of breathing will help as well.  Independent visualization and interpretation of imaging: - I independently visualized the following imaging with scope of interpretation limited to determining acute life threatening conditions related to emergency care: X-ray of the chest, which revealed bilateral pleural effusion.   Treatment and Reassessment: Plan is to get CT angio chest PE, CT soft tissue neck at this time.  This recommendation has been discussed with the family.  They are made aware, the patient will need admission to the hospital.   Final diagnoses:  Acute hypoxic respiratory failure (HCC)  Acute hypercapnic respiratory failure (HCC)  Pleural effusion    ED Discharge Orders     None           Charlyn Sora, MD 01/19/24 1621

## 2024-01-19 NOTE — ED Notes (Signed)
 Patient transported to CT

## 2024-01-19 NOTE — ED Notes (Signed)
 Care turned over to [facility]. Report given. En route to [employer]. RN Oneil aware.

## 2024-01-19 NOTE — ED Notes (Signed)
 Initial call made for transfer to [facility] ICU Stepdown. Nurse currently on the other line taking report. Asked to call back in 10 mins.

## 2024-01-19 NOTE — H&P (Addendum)
 History and Physical    [name] FMW:[id number] DOB: 27-Apr-1951 DOA: 01/19/2024 DOS: the patient was seen and examined on 01/19/2024 PCP: Donzella Lauraine SAILOR, DO  Patient coming from: Home  Chief Complaint:  Chief Complaint  Patient presents with   Respiratory Distress    Wife states rx ammoxicillin 2 weeks ago for tonsillitis, took 7 of 10 days abx, went to [facility] for diff breathing approx 1 week ago, wife suspects abx reaction   HPI: Oscar Mills is a 73 y.o. male reformed alcoholic with medical history significant for tobacco abuse, COPD, GERD, HTN, DM2 with peripheral neuropathy, PAD and right renal mass who presents to the ED with respiratory distress and acute hypoxia. - At the time of my evaluation patient is lethargic and on BiPAP after receiving Benadryl  earlier -Most of the history was obtained from patient's wife at bedside and EDP and ED RN, as well as available records. -- Patient symptoms worsened over the last couple of weeks, he was seen at [facility] and prescribed Augmentin  for presumed tonsillitis and prescribed inhalers at that time--however symptoms worsened further - In the ED today after presenting by POV (Wife) --Satting 81% RA, no hx of home O2 use, placed on 4 LPM. Increased to 6 LPM when sats remained low--he was transitioned to BiPAP  - Patient was previously seen on 03/21/2021 for by oncologist Dr. Annah Rao-due to concerns about unintentional weight loss at the time--- Dr. Melanee noted that He has been referred for abnormal weight loss.  Patient weighed 162 pounds in September 2022 133 pounds in July 2023 and 140 pounds in January 2024.  In September 2024 he weighed 118 pounds.He had a CT abdomen pelvis without contrast in July 2023 which showed a cystic 6 cm right renal lesion with a soft tissue contusion: Concerning for renal cell carcinoma.  Renal mass protocol CT or MRI was recommended.  Nonspecific retroperitoneal lymph nodes were noted  at that time as well.  He has not had those scans done so far.On his peripheral blood work patient noted to have persistent mild leukocytosis with a white cell count fluctuating between 10-15 with predominant neutrophilia.  Hemoglobin and platelet counts have been normal.  BMP shows persistent hyperglycemia with blood sugars fluctuating between 200-400.  PSA on 03/14/2023 was normal at 0.4. -- In ED  - CT head without acute finding CT Neck Soft Tissue---IMPRESSION: 1. Necrotic lymphadenopathy in the neck bilaterally consistent with metastatic disease. See the separately reported chest CT for thoracic findings. 2. Small amount of low-density material in the subglottic larynx and proximal trachea, indeterminate for adherent mucus or mass. Correlate with direct visualization. 3. Diffuse soft tissue swelling in the neck which may be secondary to venous obstruction. - CTA Chest---IMPRESSION: 1. Negative for acute PE or thoracic aortic dissection. 2. Bulky supraclavicular, mediastinal, and right hilar adenopathy, with SVC narrowing. Metastatic disease or lymphoma primary considerations. Follow-up recommended. 3. New moderate bilateral pleural effusions with left greater than right atelectasis. 4. Increased moderate pericardial effusion. 5. 7.4 cm complex cystic mass at the right renal hilum, previously 6 cm.  -D-dimer 1.12 -COVID flu and RSV negative - Creatinine is 1.77 up from 0.99 on 01/09/2024 - Bicarb is 21, potassium is 4.3 sodium 139, LFTs are not elevated - BNP 100 - WBC 10.1, hemoglobin 12.4 platelets 325 - Initial troponin 6, repeat remains 6, EKG suggest atrial flutter without RVR =-Initial VBG with PCO2 of 57 pH of 7.25 with repeat VBG pCO2 53  pH 7.26  Review of Systems: As mentioned in the history of present illness. All other systems reviewed and are negative. Past Medical History:  Diagnosis Date   Arthritis    NECK   Diabetes mellitus, type II (HCC)    Dysfunctional  alcohol  use    now quit   GERD (gastroesophageal reflux disease)    Hypertension    Neuropathy    feet   Pancreatitis    Peripheral vascular disease (HCC)    Stress fracture of ankle    Past Surgical History:  Procedure Laterality Date   ABDOMINAL AORTOGRAM W/LOWER EXTREMITY Bilateral 10/28/2019   Procedure: ABDOMINAL AORTOGRAM W/LOWER EXTREMITY;  Surgeon: Darron Deatrice LABOR, MD;  Location: [facility];  Service: Cardiovascular;  Laterality: Bilateral;   ABDOMINAL AORTOGRAM W/LOWER EXTREMITY Bilateral 06/29/2020   Procedure: ABDOMINAL AORTOGRAM W/LOWER EXTREMITY;  Surgeon: Darron Deatrice LABOR, MD;  Location: [facility];  Service: Cardiovascular;  Laterality: Bilateral;   CATARACT EXTRACTION W/PHACO Left 09/03/2019   Procedure: CATARACT EXTRACTION PHACO AND INTRAOCULAR LENS PLACEMENT (IOC) LEFT VISION BLUE 9.84 00:54.9 17.9%;  Surgeon: Ferol Rogue, MD;  Location: [facility];  Service: Ophthalmology;  Laterality: Left;  Diabetic - diet controlled   CATARACT EXTRACTION W/PHACO Right 04/18/2023   Procedure: CATARACT EXTRACTION PHACO AND INTRAOCULAR LENS PLACEMENT (IOC) RIGHT DIABETIC  RAYNER LENS TRIAL 11.80 01:50.0;  Surgeon: Enola Feliciano Hugger, MD;  Location: [facility];  Service: Ophthalmology;  Laterality: Right;   LOWER EXTREMITY ANGIOGRAM Bilateral 02/24/2014   Procedure: LOWER EXTREMITY ANGIOGRAM;  Surgeon: Deatrice LABOR Darron, MD;  Location: [facility];  Service: Cardiovascular;  Laterality: Bilateral;   PERIPHERAL VASCULAR CATHETERIZATION     PERIPHERAL VASCULAR INTERVENTION  10/28/2019   Procedure: PERIPHERAL VASCULAR INTERVENTION;  Surgeon: Darron Deatrice LABOR, MD;  Location: [facility];  Service: Cardiovascular;;   PERIPHERAL VASCULAR INTERVENTION  06/29/2020   Procedure: PERIPHERAL VASCULAR INTERVENTION;  Surgeon: Darron Deatrice LABOR, MD;  Location: [facility];  Service: Cardiovascular;;   UMBILICAL HERNIA REPAIR N/A 03/05/2017   Procedure:  HERNIA REPAIR UMBILICAL ADULT;  Surgeon: Claudene Larinda Bolder, MD;  Location: [facility];  Service: General;  Laterality: N/A;   Social History:  reports that he has been smoking cigarettes. He has a 20 pack-year smoking history. He has never used smokeless tobacco. He reports that he does not currently use alcohol . He reports that he does not use drugs.  Allergies  Allergen Reactions   Iohexol  Hives    Hives with Omnipaque  300 observed by Dr Wadie, no meds given    Family History  Problem Relation Age of Onset   Heart attack Father     Prior to Admission medications   Medication Sig Start Date End Date Taking? Authorizing Provider  gabapentin  (NEURONTIN ) 600 MG tablet Take 1,200 mg by mouth 3 (three) times daily. 12/22/23  Yes [provider]  insulin  glargine (LANTUS  SOLOSTAR) 100 UNIT/ML Solostar Pen Inject 30 Units into the skin at bedtime. Check fasting blood sugar every morning; titrate dose by 2 units every 3 days to meet fasting blood sugar goal of <150. Start with 20 units and titrate to 50 units (as estimated max dose) 07/18/23  Yes Reardon, Benton PARAS, NP  pantoprazole  (PROTONIX ) 40 MG tablet Take 1 tablet (40 mg total) by mouth daily. 08/20/23  Yes Darron Deatrice LABOR, MD  rosuvastatin  (CRESTOR ) 10 MG tablet TAKE 1 TABLET(10 MG) BY MOUTH DAILY 08/12/23  Yes Darron Deatrice LABOR, MD  Continuous  Glucose Receiver (FREESTYLE LIBRE 3 READER) DEVI 1 each by Does not apply route once for 1 dose. 04/11/23 04/11/23  Donzella Lauraine SAILOR, DO    Physical Exam: Vitals:   01/19/24 1430 01/19/24 1500 01/19/24 1530 01/19/24 1600  BP: 124/87 (!) 116/57 (!) 103/56 (!) 83/47  Pulse: 73 72 72 67  Resp: (!) 22 18 20 20   Temp:   (!) 96.7 F (35.9 C)   TempSrc:   Axillary   SpO2: 96% 96% 96% 95%  Weight:      Height:        Physical Exam  Gen:-Lethargic initially with conversational dyspnea, now on BiPAP Frail and chronically ill-appearing-- HEENT:- [place].AT, No sclera icterus--Bipap Mask Neck-  distended neck veins, palpable neck and submandibular adenopathy Lungs-diminished breath sounds, left more than right, no significant wheezing at this time CV- S1, S2 normal, RRR Abd-  +ve B.Sounds, Abd Soft, No tenderness,    Extremity/Skin:- +ve edema,   good pedal pulses  Psych-lethargic after Benadryl  was administered  neuro-quite sleepy after Benadryl  was administered, no new focal deficits  Data Reviewed:  In ED  - CT head without acute finding CT Neck Soft Tissue---IMPRESSION: 1. Necrotic lymphadenopathy in the neck bilaterally consistent with metastatic disease. See the separately reported chest CT for thoracic findings. 2. Small amount of low-density material in the subglottic larynx and proximal trachea, indeterminate for adherent mucus or mass. Correlate with direct visualization. 3. Diffuse soft tissue swelling in the neck which may be secondary to venous obstruction. - CTA Chest---IMPRESSION: 1. Negative for acute PE or thoracic aortic dissection. 2. Bulky supraclavicular, mediastinal, and right hilar adenopathy, with SVC narrowing. Metastatic disease or lymphoma primary considerations. Follow-up recommended. 3. New moderate bilateral pleural effusions with left greater than right atelectasis. 4. Increased moderate pericardial effusion. 5. 7.4 cm complex cystic mass at the right renal hilum, previously 6 cm.  -D-dimer 1.12 -COVID flu and RSV negative - Creatinine is 1.77 up from 0.99 on 01/09/2024 - Bicarb is 21, potassium is 4.3 sodium 139, LFTs are not elevated - BNP 100 - WBC 10.1, hemoglobin 12.4 platelets 325 - Initial troponin 6, repeat remains 6, EKG suggest atrial flutter without RVR =-Initial VBG with PCO2 of 57 pH of 7.25 with repeat VBG pCO2 53 pH 7.26  Assessment and Plan: 1) acute hypoxic respiratory failure--- POA---?? Etiology -No acute PE -COVID flu and RSV negative He was Satting 81% RA, no hx of home O2 use, placed on 4 LPM. Increased to 6  LPM when sats remained low--he was transitioned to BiPAP with FiO2 currently at 60% -CTA chest with bilateral pleural effusions left more than right and moderate pericardial effusion as well as significant adenopathy with SVC narrowing concerns for metastatic disease or primary lymphoma -I called and Discussed with Dr Harden Staff from PCCM service -- admit patient to [facility] hospital-with plans for possible IR image guided supraclavicular lymph node biopsy, possible bronchoscopy by PCCM, and possible oncology consult once biopsy results are available - Concerns for possible SVC narrowing may need radiation or vascular intervention  2)Unintentional weight loss in a smoker and extensive lymphadenopathy---??  Metastatic malignancy versus primary lymphoma as concerns for possible right renal cell carcinoma Patient was previously seen on 03/21/2021 for by oncologist Dr. Annah Rao-due to concerns about unintentional weight loss at the time--- Dr. Melanee noted that He has been referred for abnormal weight loss.  Patient weighed 162 pounds in September 2022 133 pounds in July 2023 and 140 pounds in January  2024.  In September 2024 he weighed 118 pounds.He had a CT abdomen pelvis without contrast in July 2023 which showed a cystic 6 cm right renal lesion with a soft tissue contusion: Concerning for renal cell carcinoma.  Renal mass protocol CT or MRI was recommended.  Nonspecific retroperitoneal lymph nodes were noted at that time as well.  He has not had those scans done so far.On his peripheral blood work patient noted to have persistent mild leukocytosis with a white cell count fluctuating between 10-15 with predominant neutrophilia.  Hemoglobin and platelet counts have been normal.  BMP shows persistent hyperglycemia with blood sugars fluctuating between 200-400.  PSA on 03/14/2023 was normal at 0.4. -- - CT soft tissue of the neck and CTA chest from 01/19/2024 with concerns for metastatic disease versus  primary lymphoma as noted above--please see report -- IR biopsy requested as above #1 - PCCM consult for possible bronchoscopy  3)AKI----acute kidney injury --due to poor oral intake  Creatinine is 1.77 up from 0.99 on 01/09/2024 -IV fluids as ordered --, renally adjust medications, avoid nephrotoxic agents / dehydration  / hypotension  4) acute COPD exacerbation--azithromycin , IV Solu-Medrol  , mucolytics and bronchodilators as ordered -- No definite pneumonia  5)GERD----continue Protonix   6)PAD--status post prior intervention, stable, continue aspirin  and Crestor  for secondary prevention -- Avoid Plavix  for now until all interventions and procedures are completed  7)DM2-   A1c was 13.4 in January 2025 reflecting uncontrolled DM with hyperglycemia -- Give Semglee  - Anticipate worsening hyperglycemia with steroid therapy Use Novolog /Humalog Sliding scale insulin  with Accu-Cheks/Fingersticks as ordered   8) history of diabetic neuropathy--- continue gabapentin  may have to adjust dose lower if renal function does not improve with hydration -- 9) adult failure to thrive--- probably related to above Body mass index is 17.32 kg/m. --- Get dietitian consult nutritional supplements once respiratory status improves - Currently n.p.o. except for sips and medications due to respiratory status   Advance Care Planning:   Code Status: Full Code   Consults: PCCM  Family Communication: Discussed with wife at bedside  Severity of Illness: The appropriate patient status for this patient is INPATIENT. Inpatient status is judged to be reasonable and necessary in order to provide the required intensity of service to ensure the patient's safety. The patient's presenting symptoms, physical exam findings, and initial radiographic and laboratory data in the context of their chronic comorbidities is felt to place them at high risk for further clinical deterioration. Furthermore, it is not anticipated that  the patient will be medically stable for discharge from the hospital within 2 midnights of admission.   * I certify that at the point of admission it is my clinical judgment that the patient will require inpatient hospital care spanning beyond 2 midnights from the point of admission due to high intensity of service, high risk for further deterioration and high frequency of surveillance required.*  Author: Rendall Carwin, MD 01/19/2024 4:34 PM  For on call review [email or web address].

## 2024-01-19 NOTE — Progress Notes (Signed)
 Patient seen and evaluated, chart reviewed, please see EMR for updated orders. - - Discussed with EDP Dr Charlyn, Ankit - Also called and Discussed with Dr Harden Staff from [facility] service - Initial plan was to admit patient to [facility] plans for possible IR image guided supraclavicular lymph node biopsy, possible bronchoscopy by PCCM, and possible oncology consult once biopsy results are available However after speaking to patient's wife--- they are requesting transfer to [facility] - They declined transfer to [facility] I spoke with pt's wife with Nurse Angeline present---- pt's wife insist on pt going to [facility], she is refusing to go to [facility] cancel [facility] bed and EDP will Transfer ED to ED to [facility] and then Hospitalist at [facility] will see pt and admit to [facility] or ICU at [facility]--- --patient remains on BiPAP at this time -Patient remains somewhat lethargic (he got Benadryl  earlier) -May be able to transfer nonrebreather bag for high flow --Defer to ED physician and respiratory therapist - Will cancel admission at this time as they are refusing admission to [facility]. - No charge note  Rendall Carwin, MD

## 2024-01-19 NOTE — ED Notes (Signed)
 ICU called back for report. Report given to ICU Nurse Oneil.

## 2024-01-19 NOTE — ED Triage Notes (Signed)
 Pt arries POV w wife. Wife states rx ammoxicillin 2 weeks ago for tonsillitis, took 7 of 10 days abx, went to [facility] for diff breathing approx 1 week ago, wife suspects abx reaction. Satting 81% RA, no hx of home O2 use, placed on 4 LPM. Increased to 6 LPM when sats remained low. Wife also states feet are swollen currently.  Hx DM, neuropathy, smoker x40 years 0.5 ppd

## 2024-01-20 ENCOUNTER — Other Ambulatory Visit: Payer: Self-pay | Admitting: Nurse Practitioner

## 2024-01-20 ENCOUNTER — Ambulatory Visit
Admit: 2024-01-20 | Discharge: 2024-01-20 | Disposition: A | Discharge status: Home / Self Care | Attending: Radiation Oncology | Admitting: Radiation Oncology

## 2024-01-20 ENCOUNTER — Inpatient Hospital Stay (HOSPITAL_COMMUNITY)

## 2024-01-20 DIAGNOSIS — R0603 Acute respiratory distress: Secondary | ICD-10-CM | POA: Diagnosis not present

## 2024-01-20 DIAGNOSIS — I3139 Other pericardial effusion (noninflammatory): Secondary | ICD-10-CM

## 2024-01-20 DIAGNOSIS — J90 Pleural effusion, not elsewhere classified: Secondary | ICD-10-CM

## 2024-01-20 DIAGNOSIS — J9601 Acute respiratory failure with hypoxia: Secondary | ICD-10-CM | POA: Diagnosis not present

## 2024-01-20 DIAGNOSIS — R590 Localized enlarged lymph nodes: Secondary | ICD-10-CM | POA: Diagnosis not present

## 2024-01-20 DIAGNOSIS — R918 Other nonspecific abnormal finding of lung field: Secondary | ICD-10-CM | POA: Insufficient documentation

## 2024-01-20 DIAGNOSIS — F17210 Nicotine dependence, cigarettes, uncomplicated: Secondary | ICD-10-CM

## 2024-01-20 LAB — BLOOD GAS, ARTERIAL
Acid-base deficit: 2.5 mmol/L — ABNORMAL HIGH (ref 0.0–2.0)
Bicarbonate: 21.7 mmol/L (ref 20.0–28.0)
Delivery systems: POSITIVE
Drawn by: 30136
Expiratory PAP: 8 cmH2O
FIO2: 60 %
Inspiratory PAP: 20 cmH2O
O2 Saturation: 98 %
Patient temperature: 37.3
RATE: 20 {breaths}/min
pCO2 arterial: 36 mmHg (ref 32–48)
pH, Arterial: 7.4 (ref 7.35–7.45)
pO2, Arterial: 79 mmHg — ABNORMAL LOW (ref 83–108)

## 2024-01-20 LAB — BODY FLUID CELL COUNT WITH DIFFERENTIAL
Eos, Fluid: 0 %
Lymphs, Fluid: 70 %
Monocyte-Macrophage-Serous Fluid: 27 % — ABNORMAL LOW (ref 50–90)
Neutrophil Count, Fluid: 3 % (ref 0–25)
Total Nucleated Cell Count, Fluid: 897 uL (ref 0–1000)

## 2024-01-20 LAB — PHOSPHORUS: Phosphorus: 4 mg/dL (ref 2.5–4.6)

## 2024-01-20 LAB — PROTEIN, PLEURAL OR PERITONEAL FLUID: Total protein, fluid: 3.1 g/dL

## 2024-01-20 LAB — GLUCOSE, PLEURAL OR PERITONEAL FLUID: Glucose, Fluid: 107 mg/dL

## 2024-01-20 LAB — GLUCOSE, CAPILLARY
Glucose-Capillary: 76 mg/dL (ref 70–99)
Glucose-Capillary: 102 mg/dL — ABNORMAL HIGH (ref 70–99)
Glucose-Capillary: 146 mg/dL — ABNORMAL HIGH (ref 70–99)
Glucose-Capillary: 147 mg/dL — ABNORMAL HIGH (ref 70–99)

## 2024-01-20 LAB — BASIC METABOLIC PANEL WITH GFR
Anion gap: 10 (ref 5–15)
BUN: 48 mg/dL — ABNORMAL HIGH (ref 8–23)
CO2: 20 mmol/L — ABNORMAL LOW (ref 22–32)
Calcium: 9.2 mg/dL (ref 8.9–10.3)
Chloride: 112 mmol/L — ABNORMAL HIGH (ref 98–111)
Creatinine, Ser: 1.89 mg/dL — ABNORMAL HIGH (ref 0.61–1.24)
GFR, Estimated: 37 mL/min — ABNORMAL LOW (ref >60)
Glucose, Bld: 90 mg/dL (ref 70–99)
Potassium: 3.8 mmol/L (ref 3.5–5.1)
Sodium: 142 mmol/L (ref 135–145)

## 2024-01-20 LAB — CBC
HCT: 35.9 % — ABNORMAL LOW (ref 39.0–52.0)
Hemoglobin: 11.3 g/dL — ABNORMAL LOW (ref 13.0–17.0)
MCH: 30.5 pg (ref 26.0–34.0)
MCHC: 31.5 g/dL (ref 30.0–36.0)
MCV: 96.8 fL (ref 80.0–100.0)
Platelets: 301 K/uL (ref 150–400)
RBC: 3.71 MIL/uL — ABNORMAL LOW (ref 4.22–5.81)
RDW: 14.6 % (ref 11.5–15.5)
WBC: 14.7 K/uL — ABNORMAL HIGH (ref 4.0–10.5)
nRBC: 0 % (ref 0.0–0.2)

## 2024-01-20 LAB — ECHOCARDIOGRAM COMPLETE
Area-P 1/2: 2.82 cm2
Height: 74 in
S' Lateral: 3.1 cm
Weight: 2158.74 [oz_av]

## 2024-01-20 LAB — LACTATE DEHYDROGENASE, PLEURAL OR PERITONEAL FLUID: LD, Fluid: 232 U/L — ABNORMAL HIGH (ref 3–23)

## 2024-01-20 LAB — MAGNESIUM: Magnesium: 2 mg/dL (ref 1.7–2.4)

## 2024-01-20 MED ORDER — METHYLPREDNISOLONE SODIUM SUCC 40 MG IJ SOLR
40.0000 mg | Freq: Every day | INTRAMUSCULAR | Status: DC
  Administered 2024-01-21 – 2024-01-22 (×2): 40 mg via INTRAVENOUS
  Filled 2024-01-20 (×2): qty 1

## 2024-01-20 MED ORDER — GABAPENTIN 300 MG PO CAPS
600.0000 mg | ORAL_CAPSULE | Freq: Three times a day (TID) | ORAL | Status: DC
  Administered 2024-01-20 – 2024-01-23 (×7): 600 mg via ORAL
  Filled 2024-01-20 (×7): qty 2

## 2024-01-20 MED ORDER — LACTATED RINGERS IV SOLN: INTRAVENOUS | Status: AC

## 2024-01-20 NOTE — Progress Notes (Addendum)
 Progress Note   [name] FMW:[id number] DOB: 06/03/1951 DOA: 01/19/2024     1 DOS: the patient was seen and examined on 01/20/2024   Brief hospital course: 73 year old male, history of tobacco abuse, alcohol  use, COPD, HTN, DM 2, peripheral arterial disease, right renal mass, presented to [facility] for symptoms of shortness of breath, respiratory distress, was hypoxic.  Was placed on BiPAP in the ED.  Was previously seen at [facility] ED on 01/09/2024, was prescribed Augmentin  and inhalers, however symptoms worsened.  He he was seen by oncologist (Dr. Annah Skene) on 03/21/2021 for weight loss.  He also had a CT scan of the abdomen and pelvis in 7/23 that showed cystic subcentimeter right renal lesion concerning for renal cell carcinoma.  Renal mass protocol CT or MRI was recommended however patient did not follow through. CT scan of the head without any acute findings,  CT scan neck soft tissue shows necrotic lymphadenopathy in the neck bilaterally consistent with metastatic disease.  Small amount of low-density material in the subglottic larynx and proximal trachea, indeterminate for mucosal mass.  Diffuse soft tissue swelling in the neck may be due to venous obstruction. CTA chest negative for PE or thoracic dissection.  Bulky supraclavicular, mediastinal and right hilar adenopathy.  SVC narrowing.  Metastatic disease or lymphoma primary considerations.  Moderate bilateral pleural effusions.  Moderate pericardial effusion.  7.4 cm complex cystic mass of the right renal hilum, previously 6 cm.  Assessment and Plan:  #1.  Acute hypoxemic respiratory failure.  Likely related to combination of extensive adenopathy with concern for metastatic disease as well as pleural effusion. Patient on NIV initially, subsequently transitioned to nonrebreather. Seen by PCCM, appreciate input.  Had thoracentesis on the right side with removal of 2 L of fluid. Continue monitor respiratory status, continue  try to wean down oxygen if possible.    #2.  Extensive mediastinal/right hilar and right supraclavicular lymphadenopathy/concern for superior vena cava syndrome.  PCCM has consulted radiation oncology, plan is for radiation therapy if patient agrees.  Will eventually need right supraclavicular node biopsy for diagnosis.  #3.  Right renal cell mass.  ? Metastatic malignancy versus other etiologies.  Will await biopsy of the right supraclavicular node at this time.  May need further input from oncology and urology.  4.  AKI.  Repeat labs in the morning.  Cautious IV hydration.  #5.  COPD exacerbation.  Continue with Solu-Medrol  and Zithromax .  #6.  Peripheral arterial disease.  Continue aspirin , Crestor .  Plavix  was held.  #7.  Type II DM.  Patient is currently on glargine insulin  as well as NovoLog  sliding scale as well as Premeal insulin .  #8.  Diabetic peripheral neuropathy.  Gabapentin .  May need to decrease dosage if patient is more somnolent.  #9.  Pericardial effusion.  Await echocardiogram.      Subjective:   Review of system is somewhat limited as patient is on nonrebreather mask.  However he reports feeling some better.  Does not report any chest pain abdominal pain, nausea or vomiting  Physical Exam: Vitals:   01/20/24 1200 01/20/24 1300 01/20/24 1400 01/20/24 1439  BP: (!) 136/53 (!) 162/61 (!) 140/55 (!) 126/49  Pulse: 76 74 73 80  Resp: (!) 28 (!) 25 11 (!) 28  Temp:      TempSrc:      SpO2: 97% 95% 97% 91%  Weight:      Height:       Physical Exam Constitutional:  Comments: On nonrebreather, somewhat somnolent  HENT:     Head: Normocephalic.     Nose: Nose normal.  Eyes:     Pupils: Pupils are equal, round, and reactive to light.  Cardiovascular:     Rate and Rhythm: Normal rate and regular rhythm.  Pulmonary:     Comments: Diminished breath sounds lung bases Abdominal:     General: Abdomen is flat.     Palpations: Abdomen is soft.   Musculoskeletal:        General: Normal range of motion.     Cervical back: Normal range of motion.  Skin:    General: Skin is warm.  Neurological:     General: No focal deficit present.  Psychiatric:        Behavior: Behavior normal.       Disposition: Status is: Inpatient Remains inpatient appropriate because: Critically ill  Planned Discharge Destination: unsure    Time spent: 40 minutes  Author: MARGARETMARY DELENA HALEY, MD 01/20/2024 3:47 PM  For on call review [email or web address].

## 2024-01-20 NOTE — Consult Note (Signed)
 NAME:  Oscar Mills, MRN:  [id number], DOB:  [birth year], LOS: 1 ADMISSION DATE:  01/19/2024 CONSULTATION DATE:  01/20/2024 REFERRING MD:  Redia - TRH, CHIEF COMPLAINT:  SOB, LAD, ?cardiac tamponade, ?SVC syndrome   History of Present Illness:  73 year old man who presented to [facility] 7/27 as a transfer from [facility] for SOB, hypoxia. PMHx significant for HTN, PVD, COPD, T2DM, pancreatitis, EtOH use (now abstinent), tobacco use, OA. Recently diagnosed with tonsillitis and placed on Augmentin  course, presented to [facility] ED 7/17 with SOB while laying flat. CXR at that time demonstrated new perihilar infiltrates (mass vs. LAD). CT Chest was recommended by EDP; however family refused with plan to follow up with PCP as outpatient.  Patient presented to [facility] 7/27 for SOB/difficulty breathing, neck swelling and LE edema; patient wife noted recent Augmentin  course for tonsillitis and was initially concerned for drug reaction. Patient was reportedly hypoxic to SpO2 81% on RA, placed on 4LNC, then [facility] with improvement. On ED arrival, patient was afebrile wit hHR 72, RR 21, BP 117/56, SpO2 97%. Labs were notable for WBC 10.1, Hgb 12.4, Plt 325. Na 139, K 4.3, CO2 21, BUN/Cr 40/1.77, glucose 264. LFTs WNL. BNP 100, Trop 6 > 6. COVID/Flu/RSV negative. VBG 7.26/53/23.7. Placed on BiPAP. CXR showed bibasilar collapse/consolidation with moderate L and small R pleural effusions with persistent R paratracheal/R mediastinal/hilar fullness. CT Head NAICA. CT Neck showed necrotic LAD bilaterally consistent with metastatic disease with small amount of low-density material in larynx c/f adherent mucus or mass and diffuse soft tissue swelling possibly 2/2 venous obstruction. CTA Chest was negative for PE/dissection, +bulky supraclavicular/mediastinal/R hilar adenopathy with SVC narrowing and new moderate bilateral pleural effusions, increased moderate pericardial effusion and 7.4cm complex cystic mass at R renal hilum. Admitted to [facility]  service. Family requested transfer to [facility], but agreed to [facility] admission after discussion with [facility] Oncology service. Patient was subsequently transferred to [facility].  On 7/28, PCCM consulted for ICU/pulmonary evaluation and management.  Pertinent Medical History:   Past Medical History:  Diagnosis Date   Arthritis    NECK   Diabetes mellitus, type II (HCC)    Dysfunctional alcohol  use    now quit   GERD (gastroesophageal reflux disease)    Hypertension    Neuropathy    feet   Pancreatitis    Peripheral vascular disease (HCC)    Stress fracture of ankle    Significant Hospital Events: Including procedures, antibiotic start and stop dates in addition to other pertinent events   7/27 - Presented to [facility] with SOB/dyspnea, facial/LE edema. Hypoxic to 81% on RA. CXR with concern for new mass due to hilar fullness. CT Head NAICA. CT Neck with bilateral LN necrosis c/f metastasis. CT Chest negative for PE but +bulky supraclavicular/mediastinal/R hilar adenopathy with SVC narrowing and new moderate bilateral pleural effusions, increased moderate pericardial effusion and 7.4cm complex cystic mass at R renal hilum. 7/28 - Transferred to [facility]. PCCM consulted.  Interim History / Subjective:  PCCM consulted for further evaluation and management of respiratory distress, possible early cardiac tamponade.  Objective:  Blood pressure (!) 136/53, pulse 76, temperature 99.2 F (37.3 C), temperature source Axillary, resp. rate (!) 28, height 6' 2 (1.88 m), weight 61.2 kg, SpO2 97%.    FiO2 (%):  [60 %] 60 % PEEP:  [8 cmH20] 8 cmH20 Pressure Support:  [12 cmH20] 12 cmH20   Intake/Output Summary (Last 24 hours) at 01/20/2024 1333 Last data filed at 01/20/2024 0600 Gross per 24 hour  Intake 2364.51 ml  Output 1000 ml  Net 1364.51 ml   Filed Weights   01/19/24 0800  Weight: 61.2 kg   Physical Examination: General: Acute-on-chronically ill-appearing older man in mild respiratory distress, appears  uncomfortable. HEENT: Chicken/AT, anicteric sclera, PERRL, moist mucous membranes. Neuro: Awake, oriented x 4, answering questions appropriately. Responds to verbal stimuli. Following commands consistently. Moves all 4 extremities spontaneously. Generalized weakness.  CV: RRR, no m/g/r. PULM: Breathing tachypneic to 20s and mildly labored on NRB. Lung fields diminished at bases bilaterally, fine crackles noted L > R. GI: Soft, nontender, nondistended. Normoactive bowel sounds. Extremities: Bilateral symmetric 1+ LE edema noted. Bilateral UE edema and mild discoloration. Skin: Warm/dry, facial plethora noted with mild erythema.  Resolved Hospital Problem List:    Assessment & Plan:   SVC syndrome 2/2 bulky supraclavicular/mediastinal/R hilar adenopathy Suspected malignancy, presume pulmonary or renal source CT Chest 7/27 with bulky supraclavicular/mediastinal/R hilar adenopathy with SVC narrowing, CT Neck with showed necrotic LAD bilaterally consistent with metastatic disease with small amount of low-density material in larynx c/f adherent mucus or mass and diffuse soft tissue swelling possibly 2/2 venous obstruction. - Appreciate Radiation Oncology consult for possible urgent XRT - F/u pleural fluid studies - Once respiratory status stabilized, can consider pursuing supraclavicular LN biopsy for definitive diagnosis  Acute hypoxemic respiratory failure likely 2/2 above Moderate bilateral pleural effusions Presumed COPD - Supplemental O2 support for  O2 sat > 90% - Bronchodilators as ordered - Continue steroids - Pulmonary hygiene - Follow CXR - S/p R thoracentesis 7/28, f/u pleural fluid studies/cyto - Consider L thoracentesis if ongoing respiratory distress  Moderate pericardial effusion HTN PVD Echo 7/28 with EF 60-65%, no RWMAs, mild LVH, G1DD, mildly reduced RV function, moderate pericardial effusion. - Appreciate Cardiology input - Cardiac monitoring - Optimize electrolytes for  K > 4, Mg > 2 - May require pericardiocentesis, pending clinical course  T2DM - SSI - CBGs Q4H, AC/HS when taking PO - Goal CBG 140-180  EtOH use (now abstinent) Tobacco use - Nicotine  patch as needed - Consider thiamine/folate supplementation  Best Practice: (right click and Reselect all SmartList Selections daily)   Per Primary Team  Labs:  CBC: Recent Labs  Lab 01/19/24 0726 01/20/24 0620  WBC 10.1 14.7*  NEUTROABS 7.9*  --   HGB 12.4* 11.3*  HCT 39.1 35.9*  MCV 98.5 96.8  PLT 325 301   Basic Metabolic Panel: Recent Labs  Lab 01/19/24 0726 01/20/24 0620  NA 139 142  K 4.3 3.8  CL 108 112*  CO2 21* 20*  GLUCOSE 264* 90  BUN 40* 48*  CREATININE 1.77* 1.89*  CALCIUM  9.0 9.2  MG  --  2.0  PHOS  --  4.0   GFR: Estimated Creatinine Clearance: 31 mL/min (A) (by C-G formula based on SCr of 1.89 mg/dL (H)). Recent Labs  Lab 01/19/24 0726 01/20/24 0620  WBC 10.1 14.7*   Liver Function Tests: Recent Labs  Lab 01/19/24 0726  AST 26  ALT 26  ALKPHOS 104  BILITOT 0.3  PROT 6.4*  ALBUMIN 3.1*   No results for input(s): LIPASE, AMYLASE in the last 168 hours. No results for input(s): AMMONIA in the last 168 hours.  ABG:    Component Value Date/Time   PHART 7.4 01/20/2024 0955   PCO2ART 36 01/20/2024 0955   PO2ART 79 (L) 01/20/2024 0955   HCO3 21.7 01/20/2024 0955   ACIDBASEDEF 2.5 (H) 01/20/2024 0955   O2SAT 98 01/20/2024 0955    Coagulation  Profile: No results for input(s): INR, PROTIME in the last 168 hours.  Cardiac Enzymes: No results for input(s): CKTOTAL, CKMB, CKMBINDEX, TROPONINI in the last 168 hours.  HbA1C: Hemoglobin A1C  Date/Time Value Ref Range Status  07/18/2023 08:14 AM 13.4 (A) 4.0 - 5.6 % Final   Hgb A1c MFr Bld  Date/Time Value Ref Range Status  01/19/2024 04:29 PM 10.4 (H) 4.8 - 5.6 % Final    Comment:    (NOTE) Diagnosis of Diabetes The following HbA1c ranges recommended by the  American Diabetes Association (ADA) may be used as an aid in the diagnosis of diabetes mellitus.  Hemoglobin             Suggested A1C NGSP%              Diagnosis  <5.7                   Non Diabetic  5.7-6.4                Pre-Diabetic  >6.4                   Diabetic  <7.0                   Glycemic control for                       adults with diabetes.    03/14/2023 02:58 PM >15.5 (H) 4.8 - 5.6 % Final    Comment:             Prediabetes: 5.7 - 6.4          Diabetes: >6.4          Glycemic control for adults with diabetes: <7.0    CBG: Recent Labs  Lab 01/19/24 1647 01/19/24 1855 01/19/24 2132 01/20/24 0755 01/20/24 1204  GLUCAP 268* 209* 166* 76 102*   Review of Systems:   Review of systems completed with pertinent positives/negatives outlined in above HPI.  Past Medical History:  He,  has a past medical history of Arthritis, Diabetes mellitus, type II (HCC), Dysfunctional alcohol  use, GERD (gastroesophageal reflux disease), Hypertension, Neuropathy, Pancreatitis, Peripheral vascular disease (HCC), and Stress fracture of ankle.   Surgical History:   Past Surgical History:  Procedure Laterality Date   ABDOMINAL AORTOGRAM W/LOWER EXTREMITY Bilateral 10/28/2019   Procedure: ABDOMINAL AORTOGRAM W/LOWER EXTREMITY;  Surgeon: Darron Deatrice LABOR, MD;  Location: [facility];  Service: Cardiovascular;  Laterality: Bilateral;   ABDOMINAL AORTOGRAM W/LOWER EXTREMITY Bilateral 06/29/2020   Procedure: ABDOMINAL AORTOGRAM W/LOWER EXTREMITY;  Surgeon: Darron Deatrice LABOR, MD;  Location: [facility];  Service: Cardiovascular;  Laterality: Bilateral;   CATARACT EXTRACTION W/PHACO Left 09/03/2019   Procedure: CATARACT EXTRACTION PHACO AND INTRAOCULAR LENS PLACEMENT (IOC) LEFT VISION BLUE 9.84 00:54.9 17.9%;  Surgeon: Ferol Rogue, MD;  Location: [facility];  Service: Ophthalmology;  Laterality: Left;  Diabetic - diet controlled   CATARACT EXTRACTION W/PHACO Right  04/18/2023   Procedure: CATARACT EXTRACTION PHACO AND INTRAOCULAR LENS PLACEMENT (IOC) RIGHT DIABETIC  RAYNER LENS TRIAL 11.80 01:50.0;  Surgeon: Enola Feliciano Hugger, MD;  Location: [facility];  Service: Ophthalmology;  Laterality: Right;   LOWER EXTREMITY ANGIOGRAM Bilateral 02/24/2014   Procedure: LOWER EXTREMITY ANGIOGRAM;  Surgeon: Deatrice LABOR Darron, MD;  Location: [facility];  Service: Cardiovascular;  Laterality: Bilateral;   PERIPHERAL VASCULAR CATHETERIZATION     PERIPHERAL VASCULAR INTERVENTION  10/28/2019  Procedure: PERIPHERAL VASCULAR INTERVENTION;  Surgeon: Darron Deatrice LABOR, MD;  Location: [facility];  Service: Cardiovascular;;   PERIPHERAL VASCULAR INTERVENTION  06/29/2020   Procedure: PERIPHERAL VASCULAR INTERVENTION;  Surgeon: Darron Deatrice LABOR, MD;  Location: [facility];  Service: Cardiovascular;;   UMBILICAL HERNIA REPAIR N/A 03/05/2017   Procedure: HERNIA REPAIR UMBILICAL ADULT;  Surgeon: Claudene Larinda Bolder, MD;  Location: [facility];  Service: General;  Laterality: N/A;   Social History:   reports that he has been smoking cigarettes. He has a 20 pack-year smoking history. He has never used smokeless tobacco. He reports that he does not currently use alcohol . He reports that he does not use drugs.   Family History:  His family history includes Heart attack in his father.   Allergies: Allergies  Allergen Reactions   Iohexol  Hives    Hives with Omnipaque  300 observed by Dr Wadie, no meds given   Home Medications: Prior to Admission medications   Medication Sig Start Date End Date Taking? Authorizing Provider  gabapentin  (NEURONTIN ) 600 MG tablet Take 1,200 mg by mouth 3 (three) times daily. 12/22/23  Yes [provider]  insulin  glargine (LANTUS  SOLOSTAR) 100 UNIT/ML Solostar Pen Inject 30 Units into the skin at bedtime. Check fasting blood sugar every morning; titrate dose by 2 units every 3 days to meet fasting blood sugar goal of <150.  Start with 20 units and titrate to 50 units (as estimated max dose) 07/18/23  Yes Reardon, Benton PARAS, NP  pantoprazole  (PROTONIX ) 40 MG tablet Take 1 tablet (40 mg total) by mouth daily. 08/20/23  Yes Darron Deatrice LABOR, MD  rosuvastatin  (CRESTOR ) 10 MG tablet TAKE 1 TABLET(10 MG) BY MOUTH DAILY 08/12/23  Yes Darron Deatrice LABOR, MD  Continuous Glucose Receiver (FREESTYLE LIBRE 3 READER) DEVI 1 each by Does not apply route once for 1 dose. 04/11/23 04/11/23  Donzella Lauraine SAILOR, DO   Critical care time: N/A   Corean CHRISTELLA Kamerin Grumbine, PA-C [facility] 01/20/24 1:33 PM  Please see [email or web address] for pager details.  From 7A-7P if no response, please call [phone] After hours, please call ELink [phone]

## 2024-01-20 NOTE — Plan of Care (Signed)
  Problem: Clinical Measurements: Goal: Respiratory complications will improve Outcome: Not Progressing Goal: Cardiovascular complication will be avoided Outcome: Not Progressing   Problem: Activity: Goal: Risk for activity intolerance will decrease Outcome: Not Progressing   Problem: Nutrition: Goal: Adequate nutrition will be maintained Outcome: Not Progressing   Problem: Elimination: Goal: Will not experience complications related to bowel motility Outcome: Not Progressing Goal: Will not experience complications related to urinary retention Outcome: Not Progressing

## 2024-01-20 NOTE — Progress Notes (Unsigned)
 {  Select_TRH_Note:[id number]}

## 2024-01-20 NOTE — Progress Notes (Signed)
 Radiation Oncology         [phone] ________________________________  Inpatient consultation note  Name: Oscar Mills MRN: [id number]  Date: 01/20/2024  DOB: Nov 17, 1950  RR:Ejmilz, Lauraine SAILOR, DO  Meade Verdon RAMAN, MD   REFERRING PHYSICIAN: Meade Verdon RAMAN, MD  DIAGNOSIS: The encounter diagnosis was Lung mass.  Metastatic malignancy versus primary lymphoma (concern for possible right renal cell carcinoma noted) - biopsies pending   HISTORY OF PRESENT ILLNESS::Oscar Mills is a 73 y.o. male who is accompanied by his supportive wife. he is seen as a courtesy of Dr. Meade for an opinion concerning radiation therapy as part of management for his recently diagnosed/suspected metastatic malignancy from an unknown primary.   He initially presented to the ED on 01/09/24 with c/o SOB. Encounter notes detail that he was recently treated for tonsillitis and a URI with Augmentin  prior to this encounter. After completing his abx course his symptoms improved rather significantly. However, he soon after developed intermittent shortness of breath, especially while laying down flat. A chest x-ray was subsequently obtained in the ED which showed evidence of new perihilar infiltrates possibly representing a mass vs lymphadenopathy.  A chest CT was subsequently recommended however the patient requested pursuing OP imaging.   He evidently did not have an OP chest CT performed and presented to the ED yesterday (01/19/24) with respiratory distress, acute hypoxia, and chest pain. His O2 sats obtained in the ED were at 81% on RA and he was accordingly placed on 4L O2. His sats however remained low on 4L prompting an increase to 6L without improvement. He was subsequently transitioned to a BiPAP.    Given his symptoms, a chest x-ray was performed which showed evidence of progressive bibasilar collapse/consolidation with moderate left and small right pleural effusions also present. Evidence of persistent  right paratracheal and right mediastinal/hilar fullness were also demonstrated.    Further imaging was promptly obtained yesterday (results detailed as follows): -- Soft tissue neck CT with contrast demonstrated: necrotic lymphadenopathy in the neck bilaterally consistent with metastatic disease; a small amount of low density material in the subglottic larynx and proximal trachea, indeterminate for adherent mucus or mass; and diffuse soft tissue swelling in the neck which may be secondary to venous obstruction.  -- CTA of the chest demonstrated showed: bulky, supraclavicular, mediastinal, and right hilar adenopathy, with SVC narrowing, with differential considerations including metastatic disease vs lymphoma considered; new moderate bilateral pleural effusions with left greater than right atelectasis; an increasing moderate pericardial effusion; and a 7.4 cm complex cystic mass in the right renal hilum (previously measuring 6 cm).  -- CT of the head showed no acute intracranial abnormalities.   He has been evaluated by pulmonary medicine and underwent a right sided thoracentesis yesterday under the care of Dr. Meade - chest x-ray performed s/p thoracentesis shows near complete resolution of the right sided pleural effusion, and persistence of the left sided pleural effusion. (Left sided thoracentesis may also be considered if his respiratory status does not improve).   With regards to treatment, pulmonary medicine has recommended urgent radiation therapy at this time which we will review in detail together today. Once his respiratory status has stabilized, pulmonary medicine has recommended proceeding with supraclavicular lymph node biopsies to obtain a definitive diagnosis.   Patient denies any chest pain or shortness of breath at this time.    PREVIOUS RADIATION THERAPY: No  PAST MEDICAL HISTORY:  Past Medical History:  Diagnosis Date   Arthritis  NECK   Diabetes mellitus, type II (HCC)     Dysfunctional alcohol  use    now quit   GERD (gastroesophageal reflux disease)    Hypertension    Neuropathy    feet   Pancreatitis    Peripheral vascular disease (HCC)    Stress fracture of ankle     PAST SURGICAL HISTORY: Past Surgical History:  Procedure Laterality Date   ABDOMINAL AORTOGRAM W/LOWER EXTREMITY Bilateral 10/28/2019   Procedure: ABDOMINAL AORTOGRAM W/LOWER EXTREMITY;  Surgeon: Darron Deatrice LABOR, MD;  Location: [facility];  Service: Cardiovascular;  Laterality: Bilateral;   ABDOMINAL AORTOGRAM W/LOWER EXTREMITY Bilateral 06/29/2020   Procedure: ABDOMINAL AORTOGRAM W/LOWER EXTREMITY;  Surgeon: Darron Deatrice LABOR, MD;  Location: [facility];  Service: Cardiovascular;  Laterality: Bilateral;   CATARACT EXTRACTION W/PHACO Left 09/03/2019   Procedure: CATARACT EXTRACTION PHACO AND INTRAOCULAR LENS PLACEMENT (IOC) LEFT VISION BLUE 9.84 00:54.9 17.9%;  Surgeon: Ferol Rogue, MD;  Location: [facility];  Service: Ophthalmology;  Laterality: Left;  Diabetic - diet controlled   CATARACT EXTRACTION W/PHACO Right 04/18/2023   Procedure: CATARACT EXTRACTION PHACO AND INTRAOCULAR LENS PLACEMENT (IOC) RIGHT DIABETIC  RAYNER LENS TRIAL 11.80 01:50.0;  Surgeon: Enola Feliciano Hugger, MD;  Location: [facility];  Service: Ophthalmology;  Laterality: Right;   LOWER EXTREMITY ANGIOGRAM Bilateral 02/24/2014   Procedure: LOWER EXTREMITY ANGIOGRAM;  Surgeon: Deatrice LABOR Darron, MD;  Location: [facility];  Service: Cardiovascular;  Laterality: Bilateral;   PERIPHERAL VASCULAR CATHETERIZATION     PERIPHERAL VASCULAR INTERVENTION  10/28/2019   Procedure: PERIPHERAL VASCULAR INTERVENTION;  Surgeon: Darron Deatrice LABOR, MD;  Location: [facility];  Service: Cardiovascular;;   PERIPHERAL VASCULAR INTERVENTION  06/29/2020   Procedure: PERIPHERAL VASCULAR INTERVENTION;  Surgeon: Darron Deatrice LABOR, MD;  Location: [facility];  Service: Cardiovascular;;   UMBILICAL HERNIA  REPAIR N/A 03/05/2017   Procedure: HERNIA REPAIR UMBILICAL ADULT;  Surgeon: Claudene Larinda Bolder, MD;  Location: [facility];  Service: General;  Laterality: N/A;    FAMILY HISTORY:  Family History  Problem Relation Age of Onset   Heart attack Father     SOCIAL HISTORY:  Social History   Tobacco Use   Smoking status: Every Day    Current packs/day: 0.50    Average packs/day: 0.5 packs/day for 40.0 years (20.0 ttl pk-yrs)    Types: Cigarettes   Smokeless tobacco: Never  Vaping Use   Vaping status: Never Used  Substance Use Topics   Alcohol  use: Not Currently    Comment: quit around 2012   Drug use: No    ALLERGIES:  Allergies  Allergen Reactions   Iohexol  Hives    Hives with Omnipaque  300 observed by Dr Wadie, no meds given    MEDICATIONS:  No current facility-administered medications for this encounter.   No current outpatient medications on file.   Facility-Administered Medications Ordered in Other Encounters  Medication Dose Route Frequency Provider Last Rate Last Admin   acetaminophen  (TYLENOL ) tablet 650 mg  650 mg Oral Q6H PRN Emokpae, Courage, MD       Or   acetaminophen  (TYLENOL ) suppository 650 mg  650 mg Rectal Q6H PRN Emokpae, Courage, MD       albuterol  (PROVENTIL ) (2.5 MG/3ML) 0.083% nebulizer solution 2.5 mg  2.5 mg Nebulization Q2H PRN Emokpae, Courage, MD       aspirin  EC tablet 81 mg  81 mg Oral Q breakfast Pearlean Manus, MD       azithromycin  (  ZITHROMAX ) 500 mg in sodium chloride  0.9 % 250 mL IVPB  500 mg Intravenous Q24H Pearlean Manus, MD   Stopped at 01/20/24 1811   bisacodyl  (DULCOLAX) suppository 10 mg  10 mg Rectal Daily PRN Pearlean Manus, MD       Chlorhexidine  Gluconate Cloth 2 % PADS 6 each  6 each Topical Q0600 Emokpae, Courage, MD   6 each at 01/20/24 1047   dextromethorphan -guaiFENesin  (MUCINEX  DM) 30-600 MG per 12 hr tablet 1 tablet  1 tablet Oral BID Emokpae, Courage, MD       gabapentin  (NEURONTIN ) capsule 600 mg  600 mg Oral TID  Arshad, Mohsin A, MD       heparin  injection 5,000 Units  5,000 Units Subcutaneous Q8H Emokpae, Courage, MD   5,000 Units at 01/20/24 1538   insulin  aspart (novoLOG ) injection 0-15 Units  0-15 Units Subcutaneous TID WC Pearlean Manus, MD   2 Units at 01/20/24 1655   insulin  aspart (novoLOG ) injection 0-5 Units  0-5 Units Subcutaneous QHS Emokpae, Courage, MD       insulin  aspart (novoLOG ) injection 3 Units  3 Units Subcutaneous TID WC Emokpae, Courage, MD   3 Units at 01/20/24 1655   insulin  glargine-yfgn (SEMGLEE ) injection 20 Units  20 Units Subcutaneous QHS Emokpae, Courage, MD   20 Units at 01/19/24 2212   ipratropium-albuterol  (DUONEB) 0.5-2.5 (3) MG/3ML nebulizer solution 3 mL  3 mL Nebulization TID Pearlean Manus, MD   3 mL at 01/20/24 2001   lactated ringers  infusion   Intravenous Continuous Desai, Nikita S, MD   Stopped at 01/20/24 1829   [START ON 01/21/2024] methylPREDNISolone  sodium succinate (SOLU-MEDROL ) 40 mg/mL injection 40 mg  40 mg Intravenous Daily Desai, Nikita S, MD       nicotine  (NICODERM CQ  - dosed in mg/24 hours) patch 21 mg  21 mg Transdermal Daily Emokpae, Courage, MD   21 mg at 01/20/24 1048   ondansetron  (ZOFRAN ) tablet 4 mg  4 mg Oral Q6H PRN Emokpae, Courage, MD       Or   ondansetron  (ZOFRAN ) injection 4 mg  4 mg Intravenous Q6H PRN Emokpae, Courage, MD       Oral care mouth rinse  15 mL Mouth Rinse 4 times per day Pearlean Manus, MD   15 mL at 01/20/24 1227   Oral care mouth rinse  15 mL Mouth Rinse PRN Emokpae, Courage, MD       pantoprazole  (PROTONIX ) EC tablet 40 mg  40 mg Oral Daily Emokpae, Courage, MD       polyethylene glycol (MIRALAX  / GLYCOLAX ) packet 17 g  17 g Oral Daily PRN Emokpae, Courage, MD       rosuvastatin  (CRESTOR ) tablet 10 mg  10 mg Oral Daily Emokpae, Courage, MD       sodium chloride  flush (NS) 0.9 % injection 3 mL  3 mL Intravenous Q12H Emokpae, Courage, MD   3 mL at 01/20/24 1049   sodium chloride  flush (NS) 0.9 % injection 3 mL  3 mL  Intravenous Q12H Emokpae, Courage, MD   3 mL at 01/20/24 1048   sodium chloride  flush (NS) 0.9 % injection 3 mL  3 mL Intravenous PRN Emokpae, Courage, MD       traZODone  (DESYREL ) tablet 50 mg  50 mg Oral QHS PRN Emokpae, Courage, MD        REVIEW OF SYSTEMS:  Notable for that above.    PHYSICAL EXAM:  General: Alert and oriented, ill-appearing HEENT: Head is normocephalic. Extraocular  movements are intact.  Neck: Neck is supple, no palpable cervical or supraclavicular lymphadenopathy. Right sided neck and arm swelling.  Heart: Regular in rate and rhythm with no murmurs, rubs, or gallops. Chest: Clear to auscultation bilaterally, with no rhonchi, wheezes, or rales. Abdomen: Soft, nontender, nondistended, with no rigidity or guarding. Extremities: No cyanosis or edema. Lymphatics: Diffuse lymphedema on the right upper extremity  Skin: No concerning lesions. Musculoskeletal: symmetric strength and muscle tone throughout. Neurologic: Cranial nerves II through XII are grossly intact. No obvious focalities. Speech is fluent. Coordination is intact. Psychiatric: Judgment and insight are intact. Affect is appropriate.   ECOG = 3  0 - Asymptomatic (Fully active, able to carry on all predisease activities without restriction)  1 - Symptomatic but completely ambulatory (Restricted in physically strenuous activity but ambulatory and able to carry out work of a light or sedentary nature. For example, light housework, office work)  2 - Symptomatic, <50% in bed during the day (Ambulatory and capable of all self care but unable to carry out any work activities. Up and about more than 50% of waking hours)  3 - Symptomatic, >50% in bed, but not bedbound (Capable of only limited self-care, confined to bed or chair 50% or more of waking hours)  4 - Bedbound (Completely disabled. Cannot carry on any self-care. Totally confined to bed or chair)  5 - Death   Raylene MM, Creech RH, Tormey DC, et al.  [phone]). Toxicity and response criteria of the [facility]. Am. DOROTHA Bridges. Oncol. 5 (6): 649-55  LABORATORY DATA:  Lab Results  Component Value Date   WBC 14.7 (H) 01/20/2024   HGB 11.3 (L) 01/20/2024   HCT 35.9 (L) 01/20/2024   MCV 96.8 01/20/2024   PLT 301 01/20/2024   NEUTROABS 7.9 (H) 01/19/2024   Lab Results  Component Value Date   NA 142 01/20/2024   K 3.8 01/20/2024   CL 112 (H) 01/20/2024   CO2 20 (L) 01/20/2024   GLUCOSE 90 01/20/2024   BUN 48 (H) 01/20/2024   CREATININE 1.89 (H) 01/20/2024   CALCIUM  9.2 01/20/2024      RADIOGRAPHY: DG CHEST PORT 1 VIEW Result Date: 01/20/2024 CLINICAL DATA:  [id number] S/P right thoracentesis [id number] EXAM: PORTABLE CHEST - 1 VIEW COMPARISON:  January 19, 2023 FINDINGS: Near complete resolution of the right pleural effusion. Persistent moderate left pleural effusion with left basilar atelectasis. No pneumothorax. Mild cardiomegaly. Similar right hilar and paratracheal fullness corresponding to the lymphadenopathy. No acute fracture or destructive lesions. Multilevel thoracic osteophytosis. IMPRESSION: 1. Near complete resolution of the right pleural effusion. No pneumothorax. 2. Persistent moderate volume left pleural effusion. Electronically Signed   By: Rogelia Myers M.D.   On: 01/20/2024 15:48   ECHOCARDIOGRAM COMPLETE Result Date: 01/20/2024    ECHOCARDIOGRAM REPORT   Patient Name:   Oscar Mills Date of Exam: 01/20/2024 Medical Rec #:  [id number]          Height:       74.0 in Accession #:    [id number]         Weight:       134.9 lb Date of Birth:  1950-07-10          BSA:          1.839 m Patient Age:    73 years           BP:           140/51 mmHg Patient Gender: M  HR:           70 bpm. Exam Location:  Inpatient Procedure: 2D Echo, Color Doppler and Cardiac Doppler (Both Spectral and Color            Flow Doppler were utilized during procedure). Indications:    Dyspnea R06.00  History:        Patient  has no prior history of Echocardiogram examinations.                 Risk Factors:Hypertension and Diabetes.  Sonographer:    Tinnie Gosling RDCS Referring Phys: [id number] COURAGE EMOKPAE IMPRESSIONS  1. Left ventricular ejection fraction, by estimation, is 60 to 65%. The left ventricle has normal function. The left ventricle has no regional wall motion abnormalities. There is mild left ventricular hypertrophy. Left ventricular diastolic parameters are consistent with Grade I diastolic dysfunction (impaired relaxation).  2. Right ventricular systolic function is mildly reduced. The right ventricular size is mildly enlarged. There is mildly elevated pulmonary artery systolic pressure. The estimated right ventricular systolic pressure is 40.0 mmHg.  3. Moderate pericardial effusion. The pericardial effusion is anterior to the right ventricle and surrounding the apex. BP 140/51 mmHg, HR 70 bpm at time of echo. IVC is dilated and noncollapsible. No RV diastolic collapse. Hepatic vein doppler challenging to interpret. No significant RA collapse. Mild respiratory related septal shift. No significant variation in mitral inflow. Finding in combination indicate no definite tamponade, but with dilated IVC may indicate early constrictive/tamponade physiology. Consider repeat limited echo in 2-3 days or with change in hemodynamics. Large pleural effusion in the left lateral region.  4. The mitral valve is grossly normal. Trivial mitral valve regurgitation. No evidence of mitral stenosis.  5. The aortic valve is tricuspid. Aortic valve regurgitation is not visualized. No aortic stenosis is present.  6. The inferior vena cava is dilated in size with <50% respiratory variability, suggesting right atrial pressure of 15 mmHg. FINDINGS  Left Ventricle: Left ventricular ejection fraction, by estimation, is 60 to 65%. The left ventricle has normal function. The left ventricle has no regional wall motion abnormalities. The left ventricular  internal cavity size was normal in size. There is  mild left ventricular hypertrophy. Left ventricular diastolic parameters are consistent with Grade I diastolic dysfunction (impaired relaxation). Right Ventricle: The right ventricular size is mildly enlarged. No increase in right ventricular wall thickness. Right ventricular systolic function is mildly reduced. There is mildly elevated pulmonary artery systolic pressure. The tricuspid regurgitant  velocity is 2.50 m/s, and with an assumed right atrial pressure of 15 mmHg, the estimated right ventricular systolic pressure is 40.0 mmHg. Left Atrium: Left atrial size was normal in size. Right Atrium: Right atrial size was normal in size. Pericardium: A moderately sized pericardial effusion is present. The pericardial effusion is anterior to the right ventricle and surrounding the apex. There is no evidence of cardiac tamponade. Presence of epicardial fat layer. Mitral Valve: The mitral valve is grossly normal. Trivial mitral valve regurgitation. No evidence of mitral valve stenosis. Tricuspid Valve: The tricuspid valve is normal in structure. Tricuspid valve regurgitation is trivial. No evidence of tricuspid stenosis. Aortic Valve: The aortic valve is tricuspid. Aortic valve regurgitation is not visualized. No aortic stenosis is present. Pulmonic Valve: The pulmonic valve was normal in structure. Pulmonic valve regurgitation is trivial. No evidence of pulmonic stenosis. Aorta: The aortic root is normal in size and structure. Venous: The inferior vena cava is dilated in size with less than 50% respiratory  variability, suggesting right atrial pressure of 15 mmHg. IAS/Shunts: The interatrial septum is aneurysmal. The interatrial septum appears to be lipomatous. The interatrial septum was not well visualized. Additional Comments: There is a large pleural effusion in the left lateral region.  LEFT VENTRICLE PLAX 2D LVIDd:         4.40 cm   Diastology LVIDs:         3.10 cm    LV e' medial:    5.00 cm/s LV PW:         1.00 cm   LV E/e' medial:  11.7 LV IVS:        1.00 cm   LV e' lateral:   8.05 cm/s LVOT diam:     2.00 cm   LV E/e' lateral: 7.3 LV SV:         68 LV SV Index:   37 LVOT Area:     3.14 cm  RIGHT VENTRICLE             IVC RV S prime:     21.20 cm/s  IVC diam: 2.50 cm TAPSE (M-mode): 1.3 cm LEFT ATRIUM           Index        RIGHT ATRIUM           Index LA diam:      2.80 cm 1.52 cm/m   RA Area:     11.20 cm LA Vol (A4C): 42.9 ml 23.33 ml/m  RA Volume:   20.30 ml  11.04 ml/m  AORTIC VALVE LVOT Vmax:   94.60 cm/s LVOT Vmean:  70.200 cm/s LVOT VTI:    0.217 m  AORTA Ao Root diam: 3.20 cm Ao Asc diam:  3.40 cm MITRAL VALVE               TRICUSPID VALVE MV Area (PHT): 2.82 cm    TR Peak grad:   25.0 mmHg MV E velocity: 58.70 cm/s  TR Vmax:        250.00 cm/s MV A velocity: 64.30 cm/s MV E/A ratio:  0.91        SHUNTS                            Systemic VTI:  0.22 m                            Systemic Diam: 2.00 cm Soyla Merck MD Electronically signed by Soyla Merck MD Signature Date/Time: 01/20/2024/10:21:17 AM    Final    CT Angio Chest PE W and/or Wo Contrast Result Date: 01/19/2024 CLINICAL DATA:  Pulmonary embolism (PE) suspected, low to intermediate prob, positive D-dimer EXAM: CT ANGIOGRAPHY CHEST WITH CONTRAST TECHNIQUE: Multidetector CT imaging of the chest was performed using the standard protocol during bolus administration of intravenous contrast. Multiplanar CT image reconstructions and MIPs were obtained to evaluate the vascular anatomy. RADIATION DOSE REDUCTION: This exam was performed according to the departmental dose-optimization program which includes automated exposure control, adjustment of the mA and/or kV according to patient size and/or use of iterative reconstruction technique. CONTRAST:  60mL OMNIPAQUE  IOHEXOL  350 MG/ML SOLN COMPARISON:  CT abdomen 01/19/2022, chest 02/16/2006 FINDINGS: Cardiovascular: SVC narrowing by mediastinal mass,  without significant collateral filling. Heart size normal. Moderate pericardial effusion. Satisfactory opacification of pulmonary arteries noted, and there is no evidence of pulmonary emboli. Mild left coronary calcifications. Adequate contrast opacification  of the thoracic aorta with no evidence of dissection, aneurysm, or stenosis. There is classic 3-vessel brachiocephalic arch anatomy without proximal stenosis. Scattered calcified plaque in the arch and descending thoracic aorta. Mediastinum/Nodes: Bulky bilateral supraclavicular adenopathy. Confluent anterior mediastinal and right paratracheal mass. Subcarinal and pre-vascular adenopathy. Confluent right hilar mass or adenopathy, encasing branches of the right upper lobe pulmonary artery. Mild left axillary adenopathy. Lungs/Pleura: Moderate bilateral pleural effusions. Atelectasis of most of the left lobe, and dependent aspects of lingula in right lower lobe. Scattered bilateral pulmonary nodules, largest on the left none mm subpleural in the upper lobe (7:91) , on the right in the superior segment lower lobe 7 mm (110:7) . no pneumothorax. Upper Abdomen: Left para-aortic adenopathy up to 1.7 cm. Extensive coarse calcifications in the pancreatic body and tail suggesting chronic pancreatitis. 7.4 cm complex cystic mass at the right renal hilum, previously 6 cm. Musculoskeletal: Bilateral shoulder DJD. Vertebral endplate spurring at multiple levels in the mid thoracic spine. Review of the MIP images confirms the above findings. IMPRESSION: 1. Negative for acute PE or thoracic aortic dissection. 2. Bulky supraclavicular, mediastinal, and right hilar adenopathy, with SVC narrowing. Metastatic disease or lymphoma primary considerations. Follow-up recommended. 3. New moderate bilateral pleural effusions with left greater than right atelectasis. 4. Increased moderate pericardial effusion. 5. 7.4 cm complex cystic mass at the right renal hilum, previously 6 cm. 6.   Aortic Atherosclerosis (ICD10-I70.0). Electronically Signed   By: JONETTA Faes M.D.   On: 01/19/2024 13:41   CT Soft Tissue Neck W Contrast Result Date: 01/19/2024 CLINICAL DATA:  Left neck soft tissue mass. Recent antibiotic treatment for tonsillitis. Difficulty breathing. Hypoxia. EXAM: CT NECK WITH CONTRAST TECHNIQUE: Multidetector CT imaging of the neck was performed using the standard protocol following the bolus administration of intravenous contrast. RADIATION DOSE REDUCTION: This exam was performed according to the departmental dose-optimization program which includes automated exposure control, adjustment of the mA and/or kV according to patient size and/or use of iterative reconstruction technique. CONTRAST:  60mL OMNIPAQUE  IOHEXOL  350 MG/ML SOLN COMPARISON:  None Available. FINDINGS: Pharynx and larynx: Mildly limited assessment due to mild motion artifact. Symmetric tonsils without substantial enlargement. Normal epiglottis. Patent airway. Small amount of low-density, mildly nodular material posterolateral E on the right in the subglottic larynx and proximal trachea (series 3, images 88 and 93) individually measuring up to 1.5 cm in craniocaudal dimension. Mild retropharyngeal edema or minimal fluid without organized retropharyngeal or peritonsillar fluid collection. Salivary glands: No inflammation, mass, or stone. Thyroid : Unremarkable. Lymph nodes: Necrotic lymphadenopathy in the neck bilaterally including conglomerate bilateral level IV/supraclavicular nodal masses measuring 5.0 x 4.3 x 6.5 cm on the right and 4.6 x 3.6 x 6.6 cm on the left. Additional smaller pathologic lymph nodes more superiorly in the neck bilaterally including a 1.7 cm short axis necrotic left submandibular node. Vascular: The internal jugular veins are compressed in the lower neck by the nodal masses but are patent more superiorly in the neck. Calcified atherosclerosis in the carotid bulbs with at least a moderate stenosis of  the proximal right ICA. Limited intracranial: Unremarkable. Visualized orbits: Bilateral cataract extraction. Mastoids and visualized paranasal sinuses: Minimal mucosal thickening in the paranasal sinuses. Clear mastoid air cells. Skeleton: Moderate cervical spondylosis.  Dental caries. Upper chest: More fully evaluated on the separately reported contemporaneous CT of the chest. Other: Relatively widespread soft tissue swelling in the left greater than right neck, nonspecific but may be secondary to venous obstruction in the superior mediastinum.  IMPRESSION: 1. Necrotic lymphadenopathy in the neck bilaterally consistent with metastatic disease. See the separately reported chest CT for thoracic findings. 2. Small amount of low-density material in the subglottic larynx and proximal trachea, indeterminate for adherent mucus or mass. Correlate with direct visualization. 3. Diffuse soft tissue swelling in the neck which may be secondary to venous obstruction. Electronically Signed   By: Dasie Hamburg M.D.   On: 01/19/2024 13:31   CT Head Wo Contrast Result Date: 01/19/2024 CLINICAL DATA:  Altered mental status.  Ataxia. EXAM: CT HEAD WITHOUT CONTRAST TECHNIQUE: Contiguous axial images were obtained from the base of the skull through the vertex without intravenous contrast. RADIATION DOSE REDUCTION: This exam was performed according to the departmental dose-optimization program which includes automated exposure control, adjustment of the mA and/or kV according to patient size and/or use of iterative reconstruction technique. COMPARISON:  None Available. FINDINGS: Brain: There is no evidence of an acute infarct, intracranial hemorrhage, mass, midline shift, or extra-axial fluid collection. There is mild cerebral atrophy. Periventricular white matter hypodensities are nonspecific but compatible with mild chronic small vessel ischemic disease. Vascular: Calcified atherosclerosis at the skull base. No hyperdense vessel.  Skull: No acute fracture or suspicious lesion. Sinuses/Orbits: Minimal mucosal thickening in the paranasal sinuses. Clear mastoid air cells. Bilateral cataract extraction. Other: None. IMPRESSION: 1. No evidence of acute intracranial abnormality. 2. Mild chronic small vessel ischemic disease. Electronically Signed   By: Dasie Hamburg M.D.   On: 01/19/2024 13:15   DG Chest Port 1 View Result Date: 01/19/2024 CLINICAL DATA:  Chest pain EXAM: PORTABLE CHEST 1 VIEW COMPARISON:  01/09/2024 FINDINGS: Bibasilar collapse/consolidation with moderate left and small right pleural effusions. Interstitial markings are diffusely coarsened with chronic features. Persistent right paratracheal and right mediastinal/hilar fullness. No acute bony abnormality. Telemetry leads overlie the chest. IMPRESSION: 1. Bibasilar collapse/consolidation with moderate left and small right pleural effusions, progressive in the interval since prior study. 2. Persistent right paratracheal and right mediastinal/hilar fullness. CT chest with contrast recommended to further evaluate. Electronically Signed   By: Camellia Candle M.D.   On: 01/19/2024 07:59   DG Chest 2 View Result Date: 01/09/2024 CLINICAL DATA:  Shortness of breath EXAM: CHEST - 2 VIEW COMPARISON:  02/22/2006 FINDINGS: Cardiac shadow is within normal limits. Aortic calcifications are noted. Bilateral pleural effusions are seen left greater than right. Fullness is noted in the right perihilar region. This may represent underlying adenopathy and or mass. CT of the chest with contrast is recommended for further evaluation. Possible nodular density in the right base is noted. No acute bony abnormality is noted. IMPRESSION: Fullness in the right perihilar region and right paratracheal region. Changes are suspicious for underlying lymphadenopathy/mass. CT with contrast is recommended for further evaluation. Bilateral pleural effusions left greater than right. Electronically Signed   By: Oneil Devonshire M.D.   On: 01/09/2024 03:54      IMPRESSION/PLAN: Metastatic malignancy versus primary lymphoma (concern for possible right renal cell carcinoma noted) - biopsies pending, presenting with superior vena cava syndrome  We reviewed this patient's current work up. He presents today with a mediastinal mass, causing superior vena cava syndrome, surrounding adenopathy concerning for metastatic disease. MRI of the brain is negative for intracranial disease. Cytology from pleural fluid is pending at this time. Agree with biopsy to confirm tissue, if cytology is negative. Dr. Shannon recommends urgent radiation to the mediastinal mass.  Today, I talked to the patient and family about the findings and work-up thus far.  We discussed the natural history of superior vena cava syndrome and general treatment, highlighting the role of radiotherapy in the management.  We discussed the available radiation techniques, and focused on the details of logistics and delivery.  We reviewed the anticipated acute and late sequelae associated with radiation in this setting.   The patient and his wife would like to receive radiation closer to home, in [place]. They are not comfortable with proceeding with treatment without a tissue diagnosis. Patient and his family are aware of the risks of proceeding without radiation treatment.   We will remain available as needed if patient decides he would like to receive radiation prior to transfer.    60 minutes of total time was spent for this patient encounter, including preparation, face-to-face counseling with the patient and coordination of care, physical exam, and documentation of the encounter.    Addendum: 01/22/2024 Yesterday the patient underwent biopsy of a right supraclavicular node with pathology pending at this time. At this point the patient is willing to proceed with simulation and treatment with eventual plans for transfer of his radiation treatment to [place]  once clinically stable for transfer.  They still do not wish to start treatment until a diagnosis of malignancy is confirmed. ------------------------------------------------   Leeroy Due, PA-C   Lynwood CHARM Nasuti, PhD, MD   [facility]  Radiation Oncology Direct Dial: [phone]  Fax: [phone] [email or web address]    This document serves as a record of services personally performed by Lynwood Nasuti, MD. It was created on his behalf by Dorthy Fuse, a trained medical scribe. The creation of this record is based on the scribe's personal observations and the provider's statements to them. This document has been checked and approved by the attending provider.

## 2024-01-20 NOTE — Progress Notes (Signed)
  Echocardiogram 2D Echocardiogram has been performed. Cardiologist notified about findings  Tinnie FORBES Gosling RDCS 01/20/2024, 9:24 AM

## 2024-01-20 NOTE — Plan of Care (Signed)
 Elwood's mentation improved substantially around noon and was able to transition from BiPAP to nonrebreather.  Approximately 2 liters removed via thoracentesis, after which he felt much better and sats improved further on nonrebreather to high 90's.  He was able to eat dinner while wearing [place] at 12L, taking deep breaths through his nose to maintain sats above 90%, then returned to wearing the nonrebreather.  After discussing with the attending provider, Raguel and his wife and sister agreed for him to stay at [facility] for continued treatment rather than transferring to [facility] due to the possibility of needing a vascular specialist if his SVC syndrome should worsen.  Foley placed due to persistent urinary retention.

## 2024-01-20 NOTE — Procedures (Signed)
 Thoracentesis  Procedure Note  Oscar Mills  [id number]  [birth year]  Date:01/20/24  Time:2:56 PM   Provider Performing:Bailie Christenbury GORMAN Meade   Procedure: Thoracentesis with imaging guidance (67444)  Indication(s) Pleural Effusion  Consent Risks of the procedure as well as the alternatives and risks of each were explained to the patient and/or caregiver.  Consent for the procedure was obtained and is signed in the bedside chart  Anesthesia Topical only with 1% lidocaine     Time Out Verified patient identification, verified procedure, site/side was marked, verified correct patient position, special equipment/implants available, medications/allergies/relevant history reviewed, required imaging and test results available.   Sterile Technique Maximal sterile technique including full sterile barrier drape, hand hygiene, sterile gown, sterile gloves, mask, hair covering, sterile ultrasound probe cover (if used).  Procedure Description Ultrasound was used to identify appropriate pleural anatomy for placement and overlying skin marked.  Area of drainage cleaned and draped in sterile fashion. Lidocaine  was used to anesthetize the skin and subcutaneous tissue.  2000 cc's of serous appearing fluid was drained from the right pleural space. Catheter then removed and bandaid applied to site.   Complications/Tolerance None; patient tolerated the procedure well. Chest X-ray is ordered to confirm no post-procedural complication.   EBL Minimal   Specimen(s) Pleural fluid

## 2024-01-20 NOTE — Hospital Course (Signed)
 73 year old male, history of tobacco abuse, alcohol  use, COPD, HTN, DM 2, peripheral arterial disease, right renal mass, presented to [facility] for symptoms of shortness of breath, respiratory distress, was hypoxic.  Was placed on BiPAP in the ED.  Was previously seen at [facility] ED on 01/09/2024, was prescribed Augmentin  and inhalers, however symptoms worsened.  He he was seen by oncologist (Dr. Annah Skene) on 03/21/2021 for weight loss.  He also had a CT scan of the abdomen and pelvis in 7/23 that showed cystic subcentimeter right renal lesion concerning for renal cell carcinoma.  Renal mass protocol CT or MRI was recommended however patient did not follow through. CT scan of the head without any acute findings,  CT scan neck soft tissue shows necrotic lymphadenopathy in the neck bilaterally consistent with metastatic disease.  Small amount of low-density material in the subglottic larynx and proximal trachea, indeterminate for mucosal mass.  Diffuse soft tissue swelling in the neck may be due to venous obstruction. CTA chest negative for PE or thoracic dissection.  Bulky supraclavicular, mediastinal and right hilar adenopathy.  SVC narrowing.  Metastatic disease or lymphoma primary considerations.  Moderate bilateral pleural effusions.  Moderate pericardial effusion.  7.4 cm complex cystic mass of the right renal hilum, previously 6 cm.

## 2024-01-21 ENCOUNTER — Inpatient Hospital Stay (HOSPITAL_COMMUNITY)

## 2024-01-21 ENCOUNTER — Ambulatory Visit: Admitting: Radiation Oncology

## 2024-01-21 DIAGNOSIS — J9601 Acute respiratory failure with hypoxia: Secondary | ICD-10-CM | POA: Diagnosis not present

## 2024-01-21 DIAGNOSIS — J9602 Acute respiratory failure with hypercapnia: Secondary | ICD-10-CM | POA: Diagnosis not present

## 2024-01-21 LAB — CBC WITH DIFFERENTIAL/PLATELET
Abs Immature Granulocytes: 0.11 K/uL — ABNORMAL HIGH (ref 0.00–0.07)
Basophils Absolute: 0 K/uL (ref 0.0–0.1)
Basophils Relative: 0 %
Eosinophils Absolute: 0 K/uL (ref 0.0–0.5)
Eosinophils Relative: 0 %
HCT: 33.6 % — ABNORMAL LOW (ref 39.0–52.0)
Hemoglobin: 10.2 g/dL — ABNORMAL LOW (ref 13.0–17.0)
Immature Granulocytes: 1 %
Lymphocytes Relative: 10 %
Lymphs Abs: 1.9 K/uL (ref 0.7–4.0)
MCH: 30 pg (ref 26.0–34.0)
MCHC: 30.4 g/dL (ref 30.0–36.0)
MCV: 98.8 fL (ref 80.0–100.0)
Monocytes Absolute: 1.5 K/uL — ABNORMAL HIGH (ref 0.1–1.0)
Monocytes Relative: 8 %
Neutro Abs: 15.3 K/uL — ABNORMAL HIGH (ref 1.7–7.7)
Neutrophils Relative %: 81 %
Platelets: 255 K/uL (ref 150–400)
RBC: 3.4 MIL/uL — ABNORMAL LOW (ref 4.22–5.81)
RDW: 14.9 % (ref 11.5–15.5)
WBC: 18.8 K/uL — ABNORMAL HIGH (ref 4.0–10.5)
nRBC: 0 % (ref 0.0–0.2)

## 2024-01-21 LAB — GLUCOSE, CAPILLARY
Glucose-Capillary: 40 mg/dL — CL (ref 70–99)
Glucose-Capillary: 40 mg/dL — CL (ref 70–99)
Glucose-Capillary: 167 mg/dL — ABNORMAL HIGH (ref 70–99)
Glucose-Capillary: 135 mg/dL — ABNORMAL HIGH (ref 70–99)
Glucose-Capillary: 172 mg/dL — ABNORMAL HIGH (ref 70–99)
Glucose-Capillary: 187 mg/dL — ABNORMAL HIGH (ref 70–99)
Glucose-Capillary: 221 mg/dL — ABNORMAL HIGH (ref 70–99)

## 2024-01-21 LAB — COMPREHENSIVE METABOLIC PANEL WITH GFR
ALT: 30 U/L (ref 0–44)
AST: 45 U/L — ABNORMAL HIGH (ref 15–41)
Albumin: 2.4 g/dL — ABNORMAL LOW (ref 3.5–5.0)
Alkaline Phosphatase: 95 U/L (ref 38–126)
Anion gap: 11 (ref 5–15)
BUN: 51 mg/dL — ABNORMAL HIGH (ref 8–23)
CO2: 17 mmol/L — ABNORMAL LOW (ref 22–32)
Calcium: 8.5 mg/dL — ABNORMAL LOW (ref 8.9–10.3)
Chloride: 113 mmol/L — ABNORMAL HIGH (ref 98–111)
Creatinine, Ser: 1.85 mg/dL — ABNORMAL HIGH (ref 0.61–1.24)
GFR, Estimated: 38 mL/min — ABNORMAL LOW (ref >60)
Glucose, Bld: 94 mg/dL (ref 70–99)
Potassium: 4 mmol/L (ref 3.5–5.1)
Sodium: 141 mmol/L (ref 135–145)
Total Bilirubin: 0.6 mg/dL (ref 0.0–1.2)
Total Protein: 4.9 g/dL — ABNORMAL LOW (ref 6.5–8.1)

## 2024-01-21 LAB — TRIGLYCERIDES, BODY FLUIDS: Triglycerides, Fluid: 11 mg/dL

## 2024-01-21 MED ORDER — DEXTROSE 50 % IV SOLN
INTRAVENOUS | Status: AC
  Filled 2024-01-21: qty 50

## 2024-01-21 MED ORDER — LIDOCAINE-EPINEPHRINE (PF) 2 %-1:200000 IJ SOLN
INTRAMUSCULAR | Status: AC
Start: 2024-01-21 — End: 2024-01-21
  Filled 2024-01-21: qty 20

## 2024-01-21 MED ORDER — INSULIN GLARGINE-YFGN 100 UNIT/ML ~~LOC~~ SOLN
10.0000 [IU] | Freq: Every day | SUBCUTANEOUS | Status: DC
  Administered 2024-01-21: 10 [IU] via SUBCUTANEOUS
  Filled 2024-01-21 (×2): qty 0.1

## 2024-01-21 MED ORDER — LIDOCAINE HCL 1 % IJ SOLN
INTRAMUSCULAR | Status: AC
  Filled 2024-01-21: qty 20

## 2024-01-21 MED ORDER — DEXTROSE 50 % IV SOLN: 25.0000 g | Freq: Once | INTRAVENOUS | Status: DC

## 2024-01-21 MED ORDER — LACTATED RINGERS IV SOLN: INTRAVENOUS | Status: AC

## 2024-01-21 NOTE — Procedures (Signed)
 Interventional Radiology Procedure Note  Procedure: Ultrasound guided right supraclavicular mass biopsy   Findings: Please refer to procedural dictation for full description. 18 ga core x4, splitting samples between formalin and saline.  Complications: None immediate  Estimated Blood Loss: < 5 ml  Recommendations: Follow Pathology results.   Ester Sides, MD

## 2024-01-21 NOTE — Plan of Care (Signed)

## 2024-01-21 NOTE — Progress Notes (Signed)
   01/21/24 1503  Oxygen Therapy/Pulse Ox  O2 Device HFNC  O2 Therapy Oxygen humidified  O2 Flow Rate (L/min) (S)  7 L/min (Weaned to 7 L HFNC.)  SpO2 98 %  Safety Instructions Yes (Comment)

## 2024-01-21 NOTE — Progress Notes (Signed)
 Progress Note   [name] FMW:[id number] DOB: March 20, 1951 DOA: 01/19/2024     2 DOS: the patient was seen and examined on 01/21/2024   Brief hospital course: 73 year old male, history of tobacco abuse, alcohol  use, COPD, HTN, DM 2, peripheral arterial disease, right renal mass, presented to [facility] for symptoms of shortness of breath, respiratory distress, was hypoxic.  Was placed on BiPAP in the ED.  Was previously seen at [facility] ED on 01/09/2024, was prescribed Augmentin  and inhalers, however symptoms worsened.  He he was seen by oncologist (Dr. Annah Skene) on 03/21/2021 for weight loss.  He also had a CT scan of the abdomen and pelvis in 7/23 that showed cystic subcentimeter right renal lesion concerning for renal cell carcinoma.  Renal mass protocol CT or MRI was recommended however patient did not follow through. CT scan of the head without any acute findings,  CT scan neck soft tissue shows necrotic lymphadenopathy in the neck bilaterally consistent with metastatic disease.  Small amount of low-density material in the subglottic larynx and proximal trachea, indeterminate for mucosal mass.  Diffuse soft tissue swelling in the neck may be due to venous obstruction. CTA chest negative for PE or thoracic dissection.  Bulky supraclavicular, mediastinal and right hilar adenopathy.  SVC narrowing.  Metastatic disease or lymphoma primary considerations.  Moderate bilateral pleural effusions.  Moderate pericardial effusion.  7.4 cm complex cystic mass of the right renal hilum, previously 6 cm.  Assessment and Plan: #1.  Acute hypoxemic respiratory failure.  Likely related to combination of extensive adenopathy with concern for metastatic disease as well as pleural effusion. Patient on NIV initially, subsequently transitioned to nonrebreather. Seen by PCCM, appreciate input.  Had thoracentesis on the right side with removal of 2 L of fluid. Weaned to [facility] this AM.     #2.  Extensive  mediastinal/right hilar and right supraclavicular lymphadenopathy/concern for superior vena cava syndrome.  PCCM has consulted radiation oncology, plan was for radiation therapy, however pt declined  -Discussed with PCCM, recommending IR consult for node biopsy as well as possible SVC stent placement -Biopsy performed 7/29. Per IR, risk for SVC stent outweighs benefits at this time. If symptoms worsen, then re-engage IR   #3.  Right renal cell mass.  ? Metastatic malignancy versus other etiologies.  Mediastinal mass biopsied, pending   4.  AKI.  Cr stable   #5.  COPD exacerbation.  Continue with Solu-Medrol  and Zithromax .   #6.  Peripheral arterial disease.  Continue aspirin , Crestor .  Plavix  was held.   #7.  Type II DM.  Patient is currently on glargine insulin  as well as NovoLog  sliding scale as well as Premeal insulin .   #8.  Diabetic peripheral neuropathy.  Cont on Gabapentin .    #9.  Pericardial effusion.  2d echo showing mod pericardial effusion, no definite tamponade, with rec for repeat limited echo in 2-3 days      Subjective: Feeling overall weak  Physical Exam: Vitals:   01/21/24 1449 01/21/24 1500 01/21/24 1503 01/21/24 1553  BP:  120/71 120/71   Pulse:  68 67 70  Resp:  (!) 23 (!) 21 15  Temp:      TempSrc:      SpO2: 92% 93% 98% 94%  Weight:      Height:       General exam: Awake, laying in bed, in nad Respiratory system: Increased respiratory effort, no wheezing Cardiovascular system: regular rate, s1, s2 Gastrointestinal system: Soft, nondistended, positive BS  Central nervous system: CN2-12 grossly intact, strength intact Extremities: Perfused, no clubbing Skin: Normal skin turgor, no notable skin lesions seen Psychiatry: Mood normal // affect seems normal  Data Reviewed:  Labs reviewed: Na 141, K 4.0, Cr 1.85, WBC 18.8, hgb 10.2, Plts 255  Family Communication: Pt in room, family not at bedside  Disposition: Status is: Inpatient Remains inpatient  appropriate because: severity of illness  Planned Discharge Destination: Unclear at this time    Author: Garnette Pelt, MD 01/21/2024 4:18 PM  For on call review [email or web address].

## 2024-01-21 NOTE — Consult Note (Addendum)
 NAME:  Oscar Mills, MRN:  [id number], DOB:  August 03, 1950, LOS: 2 ADMISSION DATE:  01/19/2024 CONSULTATION DATE:  01/20/2024 REFERRING MD:  Redia - TRH, CHIEF COMPLAINT:  SOB, LAD, ?cardiac tamponade, ?SVC syndrome   History of Present Illness:  73 year old man who presented to [facility] 7/27 as a transfer from [facility] for SOB, hypoxia. PMHx significant for HTN, PVD, COPD, T2DM, pancreatitis, EtOH use (now abstinent), tobacco use, OA. Recently diagnosed with tonsillitis and placed on Augmentin  course, presented to [facility] ED 7/17 with SOB while laying flat. CXR at that time demonstrated new perihilar infiltrates (mass vs. LAD). CT Chest was recommended by EDP; however family refused with plan to follow up with PCP as outpatient.  Patient presented to [facility] 7/27 for SOB/difficulty breathing, neck swelling and LE edema; patient wife noted recent Augmentin  course for tonsillitis and was initially concerned for drug reaction. Patient was reportedly hypoxic to SpO2 81% on RA, placed on 4LNC, then [facility] with improvement. On ED arrival, patient was afebrile wit hHR 72, RR 21, BP 117/56, SpO2 97%. Labs were notable for WBC 10.1, Hgb 12.4, Plt 325. Na 139, K 4.3, CO2 21, BUN/Cr 40/1.77, glucose 264. LFTs WNL. BNP 100, Trop 6 > 6. COVID/Flu/RSV negative. VBG 7.26/53/23.7. Placed on BiPAP. CXR showed bibasilar collapse/consolidation with moderate L and small R pleural effusions with persistent R paratracheal/R mediastinal/hilar fullness. CT Head NAICA. CT Neck showed necrotic LAD bilaterally consistent with metastatic disease with small amount of low-density material in larynx c/f adherent mucus or mass and diffuse soft tissue swelling possibly 2/2 venous obstruction. CTA Chest was negative for PE/dissection, +bulky supraclavicular/mediastinal/R hilar adenopathy with SVC narrowing and new moderate bilateral pleural effusions, increased moderate pericardial effusion and 7.4cm complex cystic mass at R renal hilum. Admitted to [facility]  service. Family requested transfer to [facility], but agreed to [facility] admission after discussion with [facility] Oncology service. Patient was subsequently transferred to [facility].  On 7/28, PCCM consulted for ICU/pulmonary evaluation and management.    reports that he has been smoking cigarettes. He has a 20 pack-year smoking history. He has never used smokeless tobacco.   Pertinent Medical History:    has a past medical history of Arthritis, Diabetes mellitus, type II (HCC), Dysfunctional alcohol  use, GERD (gastroesophageal reflux disease), Hypertension, Neuropathy, Pancreatitis, Peripheral vascular disease (HCC), and Stress fracture of ankle.   has a past surgical history that includes Cardiac catheterization; lower extremity angiogram (Bilateral, 02/24/2014); Umbilical hernia repair (N/A, 03/05/2017); Cataract extraction w/PHACO (Left, 09/03/2019); ABDOMINAL AORTOGRAM W/LOWER EXTREMITY (Bilateral, 10/28/2019); PERIPHERAL VASCULAR INTERVENTION (10/28/2019); ABDOMINAL AORTOGRAM W/LOWER EXTREMITY (Bilateral, 06/29/2020); PERIPHERAL VASCULAR INTERVENTION (06/29/2020); and Cataract extraction w/PHACO (Right, 04/18/2023).   Significant Hospital Events: Including procedures, antibiotic start and stop dates in addition to other pertinent events   7/27 - Presented to [facility] with SOB/dyspnea, facial/LE edema. Hypoxic to 81% on RA. CXR with concern for new mass due to hilar fullness. CT Head NAICA. CT Neck with bilateral LN necrosis c/f metastasis. CT Chest negative for PE but +bulky supraclavicular/mediastinal/R hilar adenopathy with SVC narrowing and new moderate bilateral pleural effusions, increased moderate pericardial effusion and 7.4cm complex cystic mass at R renal hilum.  7/28 - Transferred to [facility]. PCCM consulted.- PCCM consulted for further evaluation and management of respiratory distress, possible early cardiac tamponade. S/p 2L TRIGHT THORA -> LDH 232, 70% LYMPHS. CYTOLOGY PENDOING  ECHO: Moderate pericardial effusion.  The pericardial effusion is anterior to the right ventricle and surrounding the apex. BP 140/51 mmHg, HR 70 bpm at  time of echo. IVC is dilated and noncollapsible. No RV diastolic collapse.  Hepatic vein doppler  challenging to interpret. No significant RA collapse. Mild respiratory related septal shift. No significant variation in mitral inflow. Finding  in combination indicate no definite tamponade, but with dilated IVC may indicate early constrictive/tamponade  This Echo was disussed with Dr Vina Gull  by Dr Meade of CCM   Interim History / Subjective:   7/29: Per CCM APP o2 needs yesterday were precluding a bronchoscopy and was felt Supraclav node biopsy was safest approach. On an doff BiPAP but now down to [facility]. And lying down nearly flat. Feels better. STill wants to go to [facility] for all Rx but willing to have biopsy here. He says entiehr he nor fammily has had any prior bad experience at [place]. However, [facility] is a better drive and lot of his docs are there.  - Per Tria MD: he spoke to rad onc today and XRT has been deferred for 2 reasions from patient/family side because they want diagnosi first and they want Rx at [facility] only  - D50 given for low sugar earlier   - Significant improvement in resp status after thora   Objective:  Blood pressure 99/61, pulse 65, temperature (!) 97.4 F (36.3 C), temperature source Axillary, resp. rate (!) 21, height 6' 2 (1.88 m), weight 61.2 kg, SpO2 96%.    FiO2 (%):  [60 %] 60 % PEEP:  [8 cmH20] 8 cmH20   Intake/Output Summary (Last 24 hours) at 01/21/2024 0825 Last data filed at 01/21/2024 9491 Gross per 24 hour  Intake 2119 ml  Output 950 ml  Net 1169 ml   Filed Weights   01/19/24 0800  Weight: 61.2 kg   Physical Examination:  General Appearance:  Looks  decondtioned but better than prior desciptio. Lying down at 30 degress Head:  Normocephalic, without obvious abnormality, atraumatic Eyes:  PERRL - yes, conjunctiva/corneas - mudd     Ears:   Normal external ear canals, both ears Nose:  G tube - no but has [place] Throat:  ETT TUBE - no , OG tube - no Neck:  Supple,  No enlargement/tenderness/nodules Lungs: Clear to auscultation bilaterally, Heart:  S1 and S2 normal, no murmur, CVP - no.  Pressors - no Abdomen:  Soft, no masses, no organomegaly Genitalia / Rectal:  Not done Extremities:  Extremities- intact Skin:  ntact in exposed areas . Sacral area - no Neurologic:  Sedation - none -> RASS - +1 . Moves all 4s - yes. CAM-ICU - neg . Orientation - full yes      Resolved Hospital Problem List:    Assessment & Plan:   SVC syndrome 2/2 bulky supraclavicular/mediastinal/R hilar adenopathy, plueral and pericardial efusn Suspected malignancy, presume pulmonary or renal source  CT Chest 7/27 with bulky supraclavicular/mediastinal/R hilar adenopathy with SVC narrowing, CT Neck with showed necrotic LAD bilaterally consistent with metastatic disease with small amount of low-density material in larynx c/f adherent mucus or mass and diffuse soft tissue swelling possibly 2/2 venous obstruction.   PLAN - IR consult for supraclav node biopsy  orderdd 01/21/24 (he is agreeable to get it done at [employer] but prefers it at [facility]) - IR consult for SVC stent  - now down to [facility] and can tolerate this procedure -If supraclav negative, then consider EBU - Rad Onc consult  - wanted to do XRT 01/21/24 but hav to hold empiric XRT recommended by Rad Onc   - seems family wants this  Rx at [facility] + They want diagnosis first  - await formal note from Rad Onc - F/u pleural fluid studies   Acute hypoxemic respiratory failure likely 2/2 above Moderate bilateral pleural effusions Presumed COPD  01/21/2024 - improved and down to 3L after thora  PLAN - BiPAP PRN + at bedtime  - o2 for pulse ox > 88% - continue stesroids and BD  - intubate if wose  Moderate pericardial effusion HTN PVD Echo 7/28 with EF 60-65%, no RWMAs, mild LVH, G1DD, mildly  reduced RV function, moderate pericardial effusion.. No tamponade  - 7/27: Dr Meade spoke to Dr Okey about echo over phone  Plan - repeat echo in 72h recommended per echo report  T2DM - SSI - CBGs Q4H, AC/HS when taking PO - Goal CBG 140-180  EtOH use (now abstinent) Tobacco use - Nicotine  patch as needed - Consider thiamine/folate supplementation  Best Practice: (right click and Reselect all SmartList Selections daily)   Per Primary Team   DISPO: t his patien is very keen on transer to [facility] as all his doctors there. This is a very reasonable request because [facility] has all the needed resources to get his diagnosis and Rx started. I recommendTx to [facility]  D/w Dr Cindy ENGLAND    Dr. Dorethia Cave, M.D., F.C.C.P,  Pulmonary and Critical Care Medicine Staff Physician, [facility] Center Director - Interstitial Lung Disease  Program  Pulmonary Fibrosis [facility] at [facility] [place], [place], [address]   Pager: [phone], If no answer  -> Check AMION or Try [phone] Telephone (clinical office): [phone] Telephone (research): [phone]  8:25 AM 01/21/2024

## 2024-01-21 NOTE — Consult Note (Signed)
 Chief Complaint: Superior vena cava compression - image guided superior vena cava stent placement   Referring Provider(s): Geronimo, M  Supervising Physician: Jennefer Rover  Patient Status: [facility] - In-pt  History of Present Illness: Oscar Mills is a 73 y.o. male with history of arthritis, diabetes mellitus type 2, dysfunctional alcohol  use, gastroesophageal reflux, hypertension, neuropathy, pancreatitis, peripheral vascular disease, and newly discovered superior vena cava compression.  Patient presented to the emergency department 01/19/2024 in respiratory distress with possible antibiotic reaction to amoxicillin  for tonsillitis treatment.  Upon emergency department workup CT angio chest was performed revealing bulky supraclavicular, mediastinal, and right hilar adenopathy, with SVC narrowing.  Patient was admitted for further diagnostic workup and treatment.  Patient is known to us  from supraclavicular lymph node biopsy today, 01/21/2024, with Dr. Jennefer.  Interventional radiology was consulted for possible superior vena cava stent placement.   Patient is Full Code  Past Medical History:  Diagnosis Date   Arthritis    NECK   Diabetes mellitus, type II (HCC)    Dysfunctional alcohol  use    now quit   GERD (gastroesophageal reflux disease)    Hypertension    Neuropathy    feet   Pancreatitis    Peripheral vascular disease (HCC)    Stress fracture of ankle     Past Surgical History:  Procedure Laterality Date   ABDOMINAL AORTOGRAM W/LOWER EXTREMITY Bilateral 10/28/2019   Procedure: ABDOMINAL AORTOGRAM W/LOWER EXTREMITY;  Surgeon: Darron Deatrice LABOR, MD;  Location: [facility];  Service: Cardiovascular;  Laterality: Bilateral;   ABDOMINAL AORTOGRAM W/LOWER EXTREMITY Bilateral 06/29/2020   Procedure: ABDOMINAL AORTOGRAM W/LOWER EXTREMITY;  Surgeon: Darron Deatrice LABOR, MD;  Location: [facility];  Service: Cardiovascular;  Laterality: Bilateral;   CATARACT  EXTRACTION W/PHACO Left 09/03/2019   Procedure: CATARACT EXTRACTION PHACO AND INTRAOCULAR LENS PLACEMENT (IOC) LEFT VISION BLUE 9.84 00:54.9 17.9%;  Surgeon: Ferol Rogue, MD;  Location: [facility];  Service: Ophthalmology;  Laterality: Left;  Diabetic - diet controlled   CATARACT EXTRACTION W/PHACO Right 04/18/2023   Procedure: CATARACT EXTRACTION PHACO AND INTRAOCULAR LENS PLACEMENT (IOC) RIGHT DIABETIC  RAYNER LENS TRIAL 11.80 01:50.0;  Surgeon: Enola Feliciano Hugger, MD;  Location: [facility];  Service: Ophthalmology;  Laterality: Right;   LOWER EXTREMITY ANGIOGRAM Bilateral 02/24/2014   Procedure: LOWER EXTREMITY ANGIOGRAM;  Surgeon: Deatrice LABOR Darron, MD;  Location: [facility];  Service: Cardiovascular;  Laterality: Bilateral;   PERIPHERAL VASCULAR CATHETERIZATION     PERIPHERAL VASCULAR INTERVENTION  10/28/2019   Procedure: PERIPHERAL VASCULAR INTERVENTION;  Surgeon: Darron Deatrice LABOR, MD;  Location: [facility];  Service: Cardiovascular;;   PERIPHERAL VASCULAR INTERVENTION  06/29/2020   Procedure: PERIPHERAL VASCULAR INTERVENTION;  Surgeon: Darron Deatrice LABOR, MD;  Location: [facility];  Service: Cardiovascular;;   UMBILICAL HERNIA REPAIR N/A 03/05/2017   Procedure: HERNIA REPAIR UMBILICAL ADULT;  Surgeon: Claudene Larinda Bolder, MD;  Location: [facility] OR[facility];  Service: General;  Laterality: N/A;    Allergies: Iohexol   Medications: Prior to Admission medications   Medication Sig Start Date End Date Taking? Authorizing Provider  gabapentin  (NEURONTIN ) 600 MG tablet Take 1,200 mg by mouth 3 (three) times daily. 12/22/23  Yes [provider]  insulin  glargine (LANTUS  SOLOSTAR) 100 UNIT/ML Solostar Pen Inject 30 Units into the skin at bedtime. Check fasting blood sugar every morning; titrate dose by 2 units every 3 days to meet fasting blood sugar goal of <150. Start with 20 units  and titrate to 50 units (as estimated max dose) 07/18/23  Yes Reardon, Benton PARAS, NP   pantoprazole  (PROTONIX ) 40 MG tablet Take 1 tablet (40 mg total) by mouth daily. 08/20/23  Yes Darron Deatrice LABOR, MD  rosuvastatin  (CRESTOR ) 10 MG tablet TAKE 1 TABLET(10 MG) BY MOUTH DAILY 08/12/23  Yes Darron Deatrice LABOR, MD  Continuous Glucose Receiver (FREESTYLE LIBRE 3 READER) DEVI 1 each by Does not apply route once for 1 dose. 04/11/23 04/11/23  Donzella Lauraine SAILOR, DO     Family History  Problem Relation Age of Onset   Heart attack Father     Social History   Socioeconomic History   Marital status: Married    Spouse name: Not on file   Number of children: 2   Years of education: 12   Highest education level: 11th grade  Occupational History   Occupation: self-employed  Tobacco Use   Smoking status: Every Day    Current packs/day: 0.50    Average packs/day: 0.5 packs/day for 40.0 years (20.0 ttl pk-yrs)    Types: Cigarettes   Smokeless tobacco: Never  Vaping Use   Vaping status: Never Used  Substance and Sexual Activity   Alcohol  use: Not Currently    Comment: quit around 2012   Drug use: No   Sexual activity: Not on file  Other Topics Concern   Not on file  Social History Narrative   Not on file   Social Drivers of Health   Financial Resource Strain: Low Risk  (03/13/2023)   Overall Financial Resource Strain (CARDIA)    Difficulty of Paying Living Expenses: Not very hard  Food Insecurity: Patient Unable To Answer (01/19/2024)   Hunger Vital Sign    Worried About Running Out of Food in the Last Year: Patient unable to answer    Ran Out of Food in the Last Year: Patient unable to answer  Transportation Needs: Patient Unable To Answer (01/19/2024)   PRAPARE - Transportation    Lack of Transportation (Medical): Patient unable to answer    Lack of Transportation (Non-Medical): Patient unable to answer  Physical Activity: Insufficiently Active (08/20/2023)   Exercise Vital Sign    Days of Exercise per Week: 1 day    Minutes of Exercise per Session: 70 min  Stress: No  Stress Concern Present (08/20/2023)   [employer] of Occupational Health - Occupational Stress Questionnaire    Feeling of Stress : Not at all  Social Connections: Patient Unable To Answer (01/19/2024)   Social Connection and Isolation Panel    Frequency of Communication with Friends and Family: Patient unable to answer    Frequency of Social Gatherings with Friends and Family: Patient unable to answer    Attends Religious Services: Patient unable to answer    Active Member of Clubs or Organizations: Patient unable to answer    Attends Banker Meetings: Patient unable to answer    Marital Status: Patient unable to answer     Review of Systems: A 12 point ROS discussed and pertinent positives are indicated in the HPI above.  All other systems are negative.  Review of Systems  Constitutional:  Negative for fever.  HENT:  Positive for facial swelling. Negative for tinnitus.   Eyes:  Negative for visual disturbance.  Neurological:  Negative for dizziness, tremors and headaches.    Vital Signs: BP (!) 127/57   Pulse 69   Temp (!) 97.3 F (36.3 C) (Axillary) Comment (Src): temp would not register oral  Resp (!) 26   Ht 6' 2 (1.88 m)   Wt 134 lb 14.7 oz (61.2 kg)   SpO2 92%   BMI 17.32 kg/m   Advance Care Plan: The advanced care place/surrogate decision maker was discussed at the time of visit and the patient did not wish to discuss or was not able to name a surrogate decision maker or provide an advance care plan.  Physical Exam Constitutional:      Appearance: Normal appearance.  Musculoskeletal:        General: Swelling present.  Skin:    General: Skin is warm and dry.  Neurological:     Mental Status: He is alert and oriented to person, place, and time.     Imaging: DG Chest 1 View Result Date: 01/21/2024 CLINICAL DATA:  Short of breath EXAM: CHEST  1 VIEW COMPARISON:  Radiograph 01/20/2024, CT 01/19/2024 FINDINGS: Normal cardiac silhouette.  Persistent large RIGHT perihilar mass. Persistent widening of the upper mediastinum related lymphadenopathy. Moderate LEFT effusion slightly improved. IMPRESSION: 1. RIGHT perihilar mass concerning for malignancy. Mediastinal lymphadenopathy 2. Interval improvement in RIGHT pleural effusion compared to CT 01/19/2024 3. Persistent LEFT pleural effusion Electronically Signed   By: Jackquline Boxer M.D.   On: 01/21/2024 09:56   DG CHEST PORT 1 VIEW Result Date: 01/20/2024 CLINICAL DATA:  [id number] S/P right thoracentesis [id number] EXAM: PORTABLE CHEST - 1 VIEW COMPARISON:  January 19, 2023 FINDINGS: Near complete resolution of the right pleural effusion. Persistent moderate left pleural effusion with left basilar atelectasis. No pneumothorax. Mild cardiomegaly. Similar right hilar and paratracheal fullness corresponding to the lymphadenopathy. No acute fracture or destructive lesions. Multilevel thoracic osteophytosis. IMPRESSION: 1. Near complete resolution of the right pleural effusion. No pneumothorax. 2. Persistent moderate volume left pleural effusion. Electronically Signed   By: Rogelia Myers M.D.   On: 01/20/2024 15:48   ECHOCARDIOGRAM COMPLETE Result Date: 01/20/2024    ECHOCARDIOGRAM REPORT   Patient Name:   Oscar Mills Date of Exam: 01/20/2024 Medical Rec #:  [id number]          Height:       74.0 in Accession #:    [id number]         Weight:       134.9 lb Date of Birth:  05/08/1951          BSA:          1.839 m Patient Age:    73 years           BP:           140/51 mmHg Patient Gender: M                  HR:           70 bpm. Exam Location:  Inpatient Procedure: 2D Echo, Color Doppler and Cardiac Doppler (Both Spectral and Color            Flow Doppler were utilized during procedure). Indications:    Dyspnea R06.00  History:        Patient has no prior history of Echocardiogram examinations.                 Risk Factors:Hypertension and Diabetes.  Sonographer:    Tinnie Gosling RDCS Referring Phys:  [id number] COURAGE EMOKPAE IMPRESSIONS  1. Left ventricular ejection fraction, by estimation, is 60 to 65%. The left ventricle has normal function. The left ventricle has no regional wall motion abnormalities. There is mild left  ventricular hypertrophy. Left ventricular diastolic parameters are consistent with Grade I diastolic dysfunction (impaired relaxation).  2. Right ventricular systolic function is mildly reduced. The right ventricular size is mildly enlarged. There is mildly elevated pulmonary artery systolic pressure. The estimated right ventricular systolic pressure is 40.0 mmHg.  3. Moderate pericardial effusion. The pericardial effusion is anterior to the right ventricle and surrounding the apex. BP 140/51 mmHg, HR 70 bpm at time of echo. IVC is dilated and noncollapsible. No RV diastolic collapse. Hepatic vein doppler challenging to interpret. No significant RA collapse. Mild respiratory related septal shift. No significant variation in mitral inflow. Finding in combination indicate no definite tamponade, but with dilated IVC may indicate early constrictive/tamponade physiology. Consider repeat limited echo in 2-3 days or with change in hemodynamics. Large pleural effusion in the left lateral region.  4. The mitral valve is grossly normal. Trivial mitral valve regurgitation. No evidence of mitral stenosis.  5. The aortic valve is tricuspid. Aortic valve regurgitation is not visualized. No aortic stenosis is present.  6. The inferior vena cava is dilated in size with <50% respiratory variability, suggesting right atrial pressure of 15 mmHg. FINDINGS  Left Ventricle: Left ventricular ejection fraction, by estimation, is 60 to 65%. The left ventricle has normal function. The left ventricle has no regional wall motion abnormalities. The left ventricular internal cavity size was normal in size. There is  mild left ventricular hypertrophy. Left ventricular diastolic parameters are consistent with Grade I diastolic  dysfunction (impaired relaxation). Right Ventricle: The right ventricular size is mildly enlarged. No increase in right ventricular wall thickness. Right ventricular systolic function is mildly reduced. There is mildly elevated pulmonary artery systolic pressure. The tricuspid regurgitant  velocity is 2.50 m/s, and with an assumed right atrial pressure of 15 mmHg, the estimated right ventricular systolic pressure is 40.0 mmHg. Left Atrium: Left atrial size was normal in size. Right Atrium: Right atrial size was normal in size. Pericardium: A moderately sized pericardial effusion is present. The pericardial effusion is anterior to the right ventricle and surrounding the apex. There is no evidence of cardiac tamponade. Presence of epicardial fat layer. Mitral Valve: The mitral valve is grossly normal. Trivial mitral valve regurgitation. No evidence of mitral valve stenosis. Tricuspid Valve: The tricuspid valve is normal in structure. Tricuspid valve regurgitation is trivial. No evidence of tricuspid stenosis. Aortic Valve: The aortic valve is tricuspid. Aortic valve regurgitation is not visualized. No aortic stenosis is present. Pulmonic Valve: The pulmonic valve was normal in structure. Pulmonic valve regurgitation is trivial. No evidence of pulmonic stenosis. Aorta: The aortic root is normal in size and structure. Venous: The inferior vena cava is dilated in size with less than 50% respiratory variability, suggesting right atrial pressure of 15 mmHg. IAS/Shunts: The interatrial septum is aneurysmal. The interatrial septum appears to be lipomatous. The interatrial septum was not well visualized. Additional Comments: There is a large pleural effusion in the left lateral region.  LEFT VENTRICLE PLAX 2D LVIDd:         4.40 cm   Diastology LVIDs:         3.10 cm   LV e' medial:    5.00 cm/s LV PW:         1.00 cm   LV E/e' medial:  11.7 LV IVS:        1.00 cm   LV e' lateral:   8.05 cm/s LVOT diam:     2.00 cm   LV E/e'  lateral: 7.3  LV SV:         68 LV SV Index:   37 LVOT Area:     3.14 cm  RIGHT VENTRICLE             IVC RV S prime:     21.20 cm/s  IVC diam: 2.50 cm TAPSE (M-mode): 1.3 cm LEFT ATRIUM           Index        RIGHT ATRIUM           Index LA diam:      2.80 cm 1.52 cm/m   RA Area:     11.20 cm LA Vol (A4C): 42.9 ml 23.33 ml/m  RA Volume:   20.30 ml  11.04 ml/m  AORTIC VALVE LVOT Vmax:   94.60 cm/s LVOT Vmean:  70.200 cm/s LVOT VTI:    0.217 m  AORTA Ao Root diam: 3.20 cm Ao Asc diam:  3.40 cm MITRAL VALVE               TRICUSPID VALVE MV Area (PHT): 2.82 cm    TR Peak grad:   25.0 mmHg MV E velocity: 58.70 cm/s  TR Vmax:        250.00 cm/s MV A velocity: 64.30 cm/s MV E/A ratio:  0.91        SHUNTS                            Systemic VTI:  0.22 m                            Systemic Diam: 2.00 cm Soyla Merck MD Electronically signed by Soyla Merck MD Signature Date/Time: 01/20/2024/10:21:17 AM    Final    CT Angio Chest PE W and/or Wo Contrast Result Date: 01/19/2024 CLINICAL DATA:  Pulmonary embolism (PE) suspected, low to intermediate prob, positive D-dimer EXAM: CT ANGIOGRAPHY CHEST WITH CONTRAST TECHNIQUE: Multidetector CT imaging of the chest was performed using the standard protocol during bolus administration of intravenous contrast. Multiplanar CT image reconstructions and MIPs were obtained to evaluate the vascular anatomy. RADIATION DOSE REDUCTION: This exam was performed according to the departmental dose-optimization program which includes automated exposure control, adjustment of the mA and/or kV according to patient size and/or use of iterative reconstruction technique. CONTRAST:  60mL OMNIPAQUE  IOHEXOL  350 MG/ML SOLN COMPARISON:  CT abdomen 01/19/2022, chest 02/16/2006 FINDINGS: Cardiovascular: SVC narrowing by mediastinal mass, without significant collateral filling. Heart size normal. Moderate pericardial effusion. Satisfactory opacification of pulmonary arteries noted, and there is no  evidence of pulmonary emboli. Mild left coronary calcifications. Adequate contrast opacification of the thoracic aorta with no evidence of dissection, aneurysm, or stenosis. There is classic 3-vessel brachiocephalic arch anatomy without proximal stenosis. Scattered calcified plaque in the arch and descending thoracic aorta. Mediastinum/Nodes: Bulky bilateral supraclavicular adenopathy. Confluent anterior mediastinal and right paratracheal mass. Subcarinal and pre-vascular adenopathy. Confluent right hilar mass or adenopathy, encasing branches of the right upper lobe pulmonary artery. Mild left axillary adenopathy. Lungs/Pleura: Moderate bilateral pleural effusions. Atelectasis of most of the left lobe, and dependent aspects of lingula in right lower lobe. Scattered bilateral pulmonary nodules, largest on the left none mm subpleural in the upper lobe (7:91) , on the right in the superior segment lower lobe 7 mm (110:7) . no pneumothorax. Upper Abdomen: Left para-aortic adenopathy up to 1.7 cm. Extensive coarse calcifications in the pancreatic  body and tail suggesting chronic pancreatitis. 7.4 cm complex cystic mass at the right renal hilum, previously 6 cm. Musculoskeletal: Bilateral shoulder DJD. Vertebral endplate spurring at multiple levels in the mid thoracic spine. Review of the MIP images confirms the above findings. IMPRESSION: 1. Negative for acute PE or thoracic aortic dissection. 2. Bulky supraclavicular, mediastinal, and right hilar adenopathy, with SVC narrowing. Metastatic disease or lymphoma primary considerations. Follow-up recommended. 3. New moderate bilateral pleural effusions with left greater than right atelectasis. 4. Increased moderate pericardial effusion. 5. 7.4 cm complex cystic mass at the right renal hilum, previously 6 cm. 6.  Aortic Atherosclerosis (ICD10-I70.0). Electronically Signed   By: JONETTA Faes M.D.   On: 01/19/2024 13:41   CT Soft Tissue Neck W Contrast Result Date:  01/19/2024 CLINICAL DATA:  Left neck soft tissue mass. Recent antibiotic treatment for tonsillitis. Difficulty breathing. Hypoxia. EXAM: CT NECK WITH CONTRAST TECHNIQUE: Multidetector CT imaging of the neck was performed using the standard protocol following the bolus administration of intravenous contrast. RADIATION DOSE REDUCTION: This exam was performed according to the departmental dose-optimization program which includes automated exposure control, adjustment of the mA and/or kV according to patient size and/or use of iterative reconstruction technique. CONTRAST:  60mL OMNIPAQUE  IOHEXOL  350 MG/ML SOLN COMPARISON:  None Available. FINDINGS: Pharynx and larynx: Mildly limited assessment due to mild motion artifact. Symmetric tonsils without substantial enlargement. Normal epiglottis. Patent airway. Small amount of low-density, mildly nodular material posterolateral E on the right in the subglottic larynx and proximal trachea (series 3, images 88 and 93) individually measuring up to 1.5 cm in craniocaudal dimension. Mild retropharyngeal edema or minimal fluid without organized retropharyngeal or peritonsillar fluid collection. Salivary glands: No inflammation, mass, or stone. Thyroid : Unremarkable. Lymph nodes: Necrotic lymphadenopathy in the neck bilaterally including conglomerate bilateral level IV/supraclavicular nodal masses measuring 5.0 x 4.3 x 6.5 cm on the right and 4.6 x 3.6 x 6.6 cm on the left. Additional smaller pathologic lymph nodes more superiorly in the neck bilaterally including a 1.7 cm short axis necrotic left submandibular node. Vascular: The internal jugular veins are compressed in the lower neck by the nodal masses but are patent more superiorly in the neck. Calcified atherosclerosis in the carotid bulbs with at least a moderate stenosis of the proximal right ICA. Limited intracranial: Unremarkable. Visualized orbits: Bilateral cataract extraction. Mastoids and visualized paranasal sinuses:  Minimal mucosal thickening in the paranasal sinuses. Clear mastoid air cells. Skeleton: Moderate cervical spondylosis.  Dental caries. Upper chest: More fully evaluated on the separately reported contemporaneous CT of the chest. Other: Relatively widespread soft tissue swelling in the left greater than right neck, nonspecific but may be secondary to venous obstruction in the superior mediastinum. IMPRESSION: 1. Necrotic lymphadenopathy in the neck bilaterally consistent with metastatic disease. See the separately reported chest CT for thoracic findings. 2. Small amount of low-density material in the subglottic larynx and proximal trachea, indeterminate for adherent mucus or mass. Correlate with direct visualization. 3. Diffuse soft tissue swelling in the neck which may be secondary to venous obstruction. Electronically Signed   By: Dasie Hamburg M.D.   On: 01/19/2024 13:31   CT Head Wo Contrast Result Date: 01/19/2024 CLINICAL DATA:  Altered mental status.  Ataxia. EXAM: CT HEAD WITHOUT CONTRAST TECHNIQUE: Contiguous axial images were obtained from the base of the skull through the vertex without intravenous contrast. RADIATION DOSE REDUCTION: This exam was performed according to the departmental dose-optimization program which includes automated exposure control, adjustment of the mA  and/or kV according to patient size and/or use of iterative reconstruction technique. COMPARISON:  None Available. FINDINGS: Brain: There is no evidence of an acute infarct, intracranial hemorrhage, mass, midline shift, or extra-axial fluid collection. There is mild cerebral atrophy. Periventricular white matter hypodensities are nonspecific but compatible with mild chronic small vessel ischemic disease. Vascular: Calcified atherosclerosis at the skull base. No hyperdense vessel. Skull: No acute fracture or suspicious lesion. Sinuses/Orbits: Minimal mucosal thickening in the paranasal sinuses. Clear mastoid air cells. Bilateral  cataract extraction. Other: None. IMPRESSION: 1. No evidence of acute intracranial abnormality. 2. Mild chronic small vessel ischemic disease. Electronically Signed   By: Dasie Hamburg M.D.   On: 01/19/2024 13:15   DG Chest Port 1 View Result Date: 01/19/2024 CLINICAL DATA:  Chest pain EXAM: PORTABLE CHEST 1 VIEW COMPARISON:  01/09/2024 FINDINGS: Bibasilar collapse/consolidation with moderate left and small right pleural effusions. Interstitial markings are diffusely coarsened with chronic features. Persistent right paratracheal and right mediastinal/hilar fullness. No acute bony abnormality. Telemetry leads overlie the chest. IMPRESSION: 1. Bibasilar collapse/consolidation with moderate left and small right pleural effusions, progressive in the interval since prior study. 2. Persistent right paratracheal and right mediastinal/hilar fullness. CT chest with contrast recommended to further evaluate. Electronically Signed   By: Camellia Candle M.D.   On: 01/19/2024 07:59   DG Chest 2 View Result Date: 01/09/2024 CLINICAL DATA:  Shortness of breath EXAM: CHEST - 2 VIEW COMPARISON:  02/22/2006 FINDINGS: Cardiac shadow is within normal limits. Aortic calcifications are noted. Bilateral pleural effusions are seen left greater than right. Fullness is noted in the right perihilar region. This may represent underlying adenopathy and or mass. CT of the chest with contrast is recommended for further evaluation. Possible nodular density in the right base is noted. No acute bony abnormality is noted. IMPRESSION: Fullness in the right perihilar region and right paratracheal region. Changes are suspicious for underlying lymphadenopathy/mass. CT with contrast is recommended for further evaluation. Bilateral pleural effusions left greater than right. Electronically Signed   By: Oneil Devonshire M.D.   On: 01/09/2024 03:54    Labs:  CBC: Recent Labs    01/09/24 0325 01/19/24 0726 01/20/24 0620 01/21/24 0332  WBC 10.7* 10.1  14.7* 18.8*  HGB 12.3* 12.4* 11.3* 10.2*  HCT 38.7* 39.1 35.9* 33.6*  PLT 299 325 301 255    COAGS: No results for input(s): INR, APTT in the last 8760 hours.  BMP: Recent Labs    01/09/24 0325 01/19/24 0726 01/20/24 0620 01/21/24 0332  NA 143 139 142 141  K 4.2 4.3 3.8 4.0  CL 111 108 112* 113*  CO2 23 21* 20* 17*  GLUCOSE 159* 264* 90 94  BUN 17 40* 48* 51*  CALCIUM  9.5 9.0 9.2 8.5*  CREATININE 0.99 1.77* 1.89* 1.85*  GFRNONAA >60 41* 37* 38*    LIVER FUNCTION TESTS: Recent Labs    03/14/23 1458 01/19/24 0726 01/21/24 0332  BILITOT 0.3 0.3 0.6  AST 10 26 45*  ALT 33 26 30  ALKPHOS 120 104 95  PROT 6.5 6.4* 4.9*  ALBUMIN 4.1 3.1* 2.4*    TUMOR MARKERS: No results for input(s): AFPTM, CEA, CA199, CHROMGRNA in the last 8760 hours.  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    Dr. 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    At this time, risks outweigh benefit for superior vena cava stent placement.  If pt symptoms worsen or do not improve, please call IR for re-evaluation.    Thank you for allowing our service to participate in Oscar Mills 's care.  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

## 2024-01-21 NOTE — Progress Notes (Signed)
 Hypoglycemic Event  CBG: 40  Treatment: D50 50 mL (25 gm)  Symptoms: None  Follow-up CBG: Time: 0838 CBG Result:167  Possible Reasons for Event: Unknown  Comments/MD notified:Yes    [employer]

## 2024-01-22 ENCOUNTER — Inpatient Hospital Stay (HOSPITAL_COMMUNITY)

## 2024-01-22 ENCOUNTER — Other Ambulatory Visit: Payer: Self-pay

## 2024-01-22 DIAGNOSIS — I871 Compression of vein: Secondary | ICD-10-CM

## 2024-01-22 DIAGNOSIS — E1165 Type 2 diabetes mellitus with hyperglycemia: Secondary | ICD-10-CM | POA: Diagnosis not present

## 2024-01-22 DIAGNOSIS — J9601 Acute respiratory failure with hypoxia: Secondary | ICD-10-CM | POA: Diagnosis not present

## 2024-01-22 DIAGNOSIS — I739 Peripheral vascular disease, unspecified: Secondary | ICD-10-CM | POA: Diagnosis not present

## 2024-01-22 DIAGNOSIS — R590 Localized enlarged lymph nodes: Secondary | ICD-10-CM

## 2024-01-22 DIAGNOSIS — J441 Chronic obstructive pulmonary disease with (acute) exacerbation: Secondary | ICD-10-CM

## 2024-01-22 DIAGNOSIS — I3139 Other pericardial effusion (noninflammatory): Secondary | ICD-10-CM

## 2024-01-22 DIAGNOSIS — J90 Pleural effusion, not elsewhere classified: Secondary | ICD-10-CM

## 2024-01-22 DIAGNOSIS — N179 Acute kidney failure, unspecified: Secondary | ICD-10-CM

## 2024-01-22 LAB — COMPREHENSIVE METABOLIC PANEL WITH GFR
ALT: 30 U/L (ref 0–44)
AST: 36 U/L (ref 15–41)
Albumin: 2.4 g/dL — ABNORMAL LOW (ref 3.5–5.0)
Alkaline Phosphatase: 85 U/L (ref 38–126)
Anion gap: 8 (ref 5–15)
BUN: 54 mg/dL — ABNORMAL HIGH (ref 8–23)
CO2: 21 mmol/L — ABNORMAL LOW (ref 22–32)
Calcium: 8.5 mg/dL — ABNORMAL LOW (ref 8.9–10.3)
Chloride: 111 mmol/L (ref 98–111)
Creatinine, Ser: 2.02 mg/dL — ABNORMAL HIGH (ref 0.61–1.24)
GFR, Estimated: 35 mL/min — ABNORMAL LOW (ref >60)
Glucose, Bld: 239 mg/dL — ABNORMAL HIGH (ref 70–99)
Potassium: 4.2 mmol/L (ref 3.5–5.1)
Sodium: 140 mmol/L (ref 135–145)
Total Bilirubin: 0.6 mg/dL (ref 0.0–1.2)
Total Protein: 5.1 g/dL — ABNORMAL LOW (ref 6.5–8.1)

## 2024-01-22 LAB — GLUCOSE, CAPILLARY
Glucose-Capillary: 238 mg/dL — ABNORMAL HIGH (ref 70–99)
Glucose-Capillary: 260 mg/dL — ABNORMAL HIGH (ref 70–99)
Glucose-Capillary: 366 mg/dL — ABNORMAL HIGH (ref 70–99)
Glucose-Capillary: 223 mg/dL — ABNORMAL HIGH (ref 70–99)

## 2024-01-22 LAB — URINALYSIS, COMPLETE (UACMP) WITH MICROSCOPIC
Bilirubin Urine: NEGATIVE
Glucose, UA: 50 mg/dL — AB
Ketones, ur: NEGATIVE mg/dL
Nitrite: NEGATIVE
Protein, ur: NEGATIVE mg/dL
Specific Gravity, Urine: 1.014 (ref 1.005–1.030)
pH: 5 (ref 5.0–8.0)

## 2024-01-22 LAB — CBC
HCT: 35.4 % — ABNORMAL LOW (ref 39.0–52.0)
Hemoglobin: 11.1 g/dL — ABNORMAL LOW (ref 13.0–17.0)
MCH: 30.9 pg (ref 26.0–34.0)
MCHC: 31.4 g/dL (ref 30.0–36.0)
MCV: 98.6 fL (ref 80.0–100.0)
Platelets: 243 K/uL (ref 150–400)
RBC: 3.59 MIL/uL — ABNORMAL LOW (ref 4.22–5.81)
RDW: 15 % (ref 11.5–15.5)
WBC: 14.1 K/uL — ABNORMAL HIGH (ref 4.0–10.5)
nRBC: 0 % (ref 0.0–0.2)

## 2024-01-22 LAB — SODIUM, URINE, RANDOM: Sodium, Ur: 15 mmol/L

## 2024-01-22 LAB — CREATININE, URINE, RANDOM: Creatinine, Urine: 73 mg/dL

## 2024-01-22 MED ORDER — SODIUM CHLORIDE 0.9 % IV SOLN: INTRAVENOUS | Status: DC

## 2024-01-22 MED ORDER — INSULIN GLARGINE-YFGN 100 UNIT/ML ~~LOC~~ SOLN
15.0000 [IU] | Freq: Every day | SUBCUTANEOUS | Status: DC
  Administered 2024-01-22: 15 [IU] via SUBCUTANEOUS
  Filled 2024-01-22: qty 0.15

## 2024-01-22 MED ORDER — INSULIN ASPART 100 UNIT/ML IJ SOLN
3.0000 [IU] | Freq: Three times a day (TID) | INTRAMUSCULAR | Status: DC
  Administered 2024-01-23: 3 [IU] via SUBCUTANEOUS

## 2024-01-22 NOTE — TOC Initial Note (Signed)
 Transition of Care [facility]) - Initial/Assessment Note    Patient Details  Name: Oscar Mills MRN: [id number] Date of Birth: [birth year]  [facility] CM/SW Contact:    Jon ONEIDA Anon, RN Phone Number: 01/22/2024, 10:51 AM  Clinical Narrative:                 Pt is from home, with spouse. Pt spouse Mate Alegria [phone], is pt POC.  Pt requiring oxygen, that is not baseline. May need home O2 setup. Continued medical workup, not medically stable for DC. Pt does not have any HH or DME needs at this time. There are no SDOH risks identified. IP Care Management is continuing to follow for any new recommendations or DC needs.  Expected Discharge Plan: Home/Self Care Barriers to Discharge: Continued Medical Work up   Patient Goals and CMS Choice Patient states their goals for this hospitalization and ongoing recovery are:: Return home CMS Medicare.gov Compare Post Acute Care list provided to:: Other (Comment Required) (NA) Choice offered to / list presented to : NA [place] ownership interest in [facility].provided to:: Parent NA    Expected Discharge Plan and Services In-house Referral: NA Discharge Planning Services: CM Consult Post Acute Care Choice: NA Living arrangements for the past 2 months: Single Family Home                 DME Arranged: N/A DME Agency: NA       HH Arranged: NA HH Agency: NA        Prior Living Arrangements/Services Living arrangements for the past 2 months: Single Family Home Lives with:: Spouse Patient language and need for interpreter reviewed:: Yes Do you feel safe going back to the place where you live?: Yes      Need for Family Participation in Patient Care: No (Comment) Care giver support system in place?: No (comment) Current home services: Other (comment) (NA) Criminal Activity/Legal Involvement Pertinent to Current Situation/Hospitalization: No - Comment as needed  Activities of Daily Living       Permission Sought/Granted Permission sought to share information with : Family Supports Permission granted to share information with : Yes, Verbal Permission Granted  Share Information with NAME: Bodhi, Moradi (Spouse)  [phone]           Emotional Assessment Appearance:: Appears older than stated age Attitude/Demeanor/Rapport: Engaged Affect (typically observed): Appropriate Orientation: : Oriented to Self, Oriented to Place, Oriented to  Time, Oriented to Situation Alcohol  / Substance Use: Not Applicable Psych Involvement: No (comment)  Admission diagnosis:  Pleural effusion [J90] Hypoxia [R09.02] Acute respiratory failure with hypoxia (HCC) [J96.01] Acute hypercapnic respiratory failure (HCC) [J96.02] Acute hypoxic respiratory failure (HCC) [J96.01] Patient Active Problem List   Diagnosis Date Noted   Pleural effusion 01/20/2024   Pericardial effusion 01/20/2024   Lung mass 01/20/2024   Acute respiratory failure with hypoxia (HCC) 01/19/2024   Lymphadenopathy 01/19/2024   Acute hypoxic respiratory failure (HCC) 01/19/2024   Sarcopenia 04/18/2023   Abnormal CT scan, kidney 03/28/2023   Abnormal CT scan, gastrointestinal tract 03/28/2023   Uncontrolled diabetes mellitus with hyperglycemia (HCC) 03/28/2023   Protein-calorie malnutrition, severe (HCC) 03/28/2023   Hypertension associated with diabetes (HCC) 03/14/2023   Hyperlipidemia due to type 2 diabetes mellitus (HCC) 03/14/2023   Pancreatic insufficiency 03/14/2023   Weight loss 03/14/2023   Gastroesophageal reflux disease without esophagitis 03/14/2023   Screening PSA (prostate specific antigen) 03/14/2023   Annual physical exam 03/14/2023   Type 2 diabetes mellitus  with complication, without long-term current use of insulin  (HCC) 10/29/2019   PAD (peripheral artery disease) (HCC) 01/21/2014   Nicotine  dependence with current use 10/15/2012   PCP:  Donzella Lauraine SAILOR, DO Pharmacy:   [facility]  [phone] - [place], [place] - [address] [place] [address] Phone: [phone] Fax: [phone]     Social Drivers of Health (SDOH) Social History: SDOH Screenings   Food Insecurity: Patient Unable To Answer (01/19/2024)  Housing: Patient Unable To Answer (01/19/2024)  Transportation Needs: Patient Unable To Answer (01/19/2024)  Utilities: Not At Risk (08/20/2023)  Alcohol  Screen: Low Risk  (08/20/2023)  Depression (PHQ2-9): Low Risk  (08/20/2023)  Financial Resource Strain: Low Risk  (03/13/2023)  Physical Activity: Insufficiently Active (08/20/2023)  Social Connections: Patient Unable To Answer (01/19/2024)  Stress: No Stress Concern Present (08/20/2023)  Tobacco Use: High Risk (01/19/2024)  Health Literacy: Adequate Health Literacy (08/20/2023)   SDOH Interventions:     Readmission Risk Interventions    01/22/2024   10:45 AM  Readmission Risk Prevention Plan  Transportation Screening Complete  PCP or Specialist Appt within 5-7 Days Complete  Home Care Screening Complete  Medication Review (RN CM) Complete

## 2024-01-22 NOTE — Progress Notes (Signed)
   01/22/24 0117  BiPAP/CPAP/SIPAP  $ Non-Invasive Home Ventilator  Subsequent  BiPAP/CPAP/SIPAP Pt Type Adult  BiPAP/CPAP/SIPAP V60  Mask Type Full face mask  Dentures removed? Not applicable  Mask Size Large  Set Rate 20 breaths/min  Respiratory Rate 23 breaths/min  IPAP 20 cmH20  EPAP 8 cmH2O  PEEP 8 cmH20  FiO2 (%) 40 %  Leak 10  Peak Inspiratory Pressure (PIP) 22  Tidal Volume (Vt) 652  Patient Home Machine No  Patient Home Mask No  Patient Home Tubing No  Auto Titrate No  Press High Alarm 35 cmH2O  Press Low Alarm 5 cmH2O  CPAP/SIPAP surface wiped down Yes  BiPAP/CPAP /SiPAP Vitals  Pulse Rate 72  Resp (!) 32  BP (!) 150/60  SpO2 96 %  Bilateral Breath Sounds Diminished  MEWS Score/Color  MEWS Score 2  MEWS Score Color Yellow

## 2024-01-22 NOTE — Consult Note (Signed)
 [facility] CONSULT NOTE  Patient Care Team: Pardue, Lauraine SAILOR, DO as PCP - General (Family Medicine) Darron Deatrice LABOR, MD as PCP - Cardiology (Cardiology) Melanee Annah BROCKS, MD as Consulting Physician (Oncology) Pa, [facility] (Optometry)  CHIEF COMPLAINTS/PURPOSE OF CONSULTATION:  Acute respiratory issues secondary to possible mets versus lymphoma  REFERRING PHYSICIAN: Dr. Sebastian  HISTORY OF PRESENTING ILLNESS:  Oscar Mills 73 y.o. male who was admitted on 7/27 due to respiratory distress.  Workup was done in the ED including imaging which showed necrotic lymphadenopathy consistent with metastatic disease.  Therefore oncology consult has been requested. Patient is seen awake alert and oriented x 3 laying in bed in obvious respiratory distress with use of accessory muscles noted.  Patient's wife and sister are at bedside who serves as principal historians.  Family states that patient began having problems breathing approximately 2 weeks ago and went to urgent care in [place].  It was thought his problems were due to his tonsils and he was given antibiotics and sent home.  Last week Sunday his feet began to be swollen and he was taken to [facility] where he was transferred from. Medical history significant for COPD, hypertension, diabetes, PAD, pancreatitis, and right renal mass. Surgical history includes hernia repair and stents placed by cardio. Family history significant for patient's sister with breast cancer and several aunts with unknown cancer. Social history significant for 40-year history of tobacco use at half pack per day, currently wears nicotine  patches.  Admits to heavy alcohol  use in the past, reports quitting 15 years ago.  Denies recreational or illicit drug use.  Worked as a Psychologist, occupational in the past.      I have reviewed his chart and materials related to his cancer extensively and collaborated history with the patient. Summary of oncologic  history is as follows: Oncology History   No history exists.    ASSESSMENT & PLAN:   Extensive lymphadenopathy  - Imaging of neck done 01/19/2024 shows necrotic lymphadenopathy in the neck bilaterally consistent with metastatic disease.  There is also diffuse soft tissue swelling in the neck. - CT angio chest shows bulky supraclavicular, mediastinal, and right hilar adenopathy with SVC narrowing.  Metastatic disease or lymphoma primary considerations. - Mediastinal mass biopsied 01/20/2024, path is pending - Will need definitive path for further medical oncology treatment planning as appropriate. - Tumor marker CEA ordered - Medical oncology/Dr. Gudena following closely and will make further evaluation and treatment recommendations  Acute respiratory distress COPD -Likely multifactorial secondary to lymphadenopathy and COPD - Status post right-sided thoracentesis with 2 L fluid removed - Continue Solu-Medrol  and antibiotics as ordered - Continue O2 therapy - Monitor respiratory status closely  Right renal mass - Per previous imaging - Mediastinal mass biopsied 7/28  Leukocytosis - WBC elevated 14.1 - Continue antibiotics as ordered - Afebrile, monitor fever curve - Monitor CBC with differential  Anemia - Mild - Hemoglobin stable 11.1 - No transfusional intervention required  Renal insufficiency - Elevated creatinine and BUN - Avoid nephrotoxic agents - Continue to monitor renal function   MEDICAL HISTORY:  Past Medical History:  Diagnosis Date   Arthritis    NECK   Diabetes mellitus, type II (HCC)    Dysfunctional alcohol  use    now quit   GERD (gastroesophageal reflux disease)    Hypertension    Neuropathy    feet   Pancreatitis    Peripheral vascular disease (HCC)    Stress fracture  of ankle     SURGICAL HISTORY: Past Surgical History:  Procedure Laterality Date   ABDOMINAL AORTOGRAM W/LOWER EXTREMITY Bilateral 10/28/2019   Procedure: ABDOMINAL AORTOGRAM  W/LOWER EXTREMITY;  Surgeon: Darron Deatrice LABOR, MD;  Location: [facility];  Service: Cardiovascular;  Laterality: Bilateral;   ABDOMINAL AORTOGRAM W/LOWER EXTREMITY Bilateral 06/29/2020   Procedure: ABDOMINAL AORTOGRAM W/LOWER EXTREMITY;  Surgeon: Darron Deatrice LABOR, MD;  Location: [facility];  Service: Cardiovascular;  Laterality: Bilateral;   CATARACT EXTRACTION W/PHACO Left 09/03/2019   Procedure: CATARACT EXTRACTION PHACO AND INTRAOCULAR LENS PLACEMENT (IOC) LEFT VISION BLUE 9.84 00:54.9 17.9%;  Surgeon: Ferol Rogue, MD;  Location: [facility];  Service: Ophthalmology;  Laterality: Left;  Diabetic - diet controlled   CATARACT EXTRACTION W/PHACO Right 04/18/2023   Procedure: CATARACT EXTRACTION PHACO AND INTRAOCULAR LENS PLACEMENT (IOC) RIGHT DIABETIC  RAYNER LENS TRIAL 11.80 01:50.0;  Surgeon: Enola Feliciano Hugger, MD;  Location: [facility];  Service: Ophthalmology;  Laterality: Right;   LOWER EXTREMITY ANGIOGRAM Bilateral 02/24/2014   Procedure: LOWER EXTREMITY ANGIOGRAM;  Surgeon: Deatrice LABOR Darron, MD;  Location: [facility] CATH [facility];  Service: Cardiovascular;  Laterality: Bilateral;   PERIPHERAL VASCULAR CATHETERIZATION     PERIPHERAL VASCULAR INTERVENTION  10/28/2019   Procedure: PERIPHERAL VASCULAR INTERVENTION;  Surgeon: Darron Deatrice LABOR, MD;  Location: [facility];  Service: Cardiovascular;;   PERIPHERAL VASCULAR INTERVENTION  06/29/2020   Procedure: PERIPHERAL VASCULAR INTERVENTION;  Surgeon: Darron Deatrice LABOR, MD;  Location: [facility];  Service: Cardiovascular;;   UMBILICAL HERNIA REPAIR N/A 03/05/2017   Procedure: HERNIA REPAIR UMBILICAL ADULT;  Surgeon: Claudene Larinda Bolder, MD;  Location: [facility] OR[facility];  Service: General;  Laterality: N/A;    SOCIAL HISTORY: Social History   Socioeconomic History   Marital status: Married    Spouse name: Not on file   Number of children: 2   Years of education: 12   Highest education level: 11th grade   Occupational History   Occupation: self-employed  Tobacco Use   Smoking status: Every Day    Current packs/day: 0.50    Average packs/day: 0.5 packs/day for 40.0 years (20.0 ttl pk-yrs)    Types: Cigarettes   Smokeless tobacco: Never  Vaping Use   Vaping status: Never Used  Substance and Sexual Activity   Alcohol  use: Not Currently    Comment: quit around 2012   Drug use: No   Sexual activity: Not on file  Other Topics Concern   Not on file  Social History Narrative   Not on file   Social Drivers of Health   Financial Resource Strain: Low Risk  (03/13/2023)   Overall Financial Resource Strain (CARDIA)    Difficulty of Paying Living Expenses: Not very hard  Food Insecurity: Patient Unable To Answer (01/19/2024)   Hunger Vital Sign    Worried About Running Out of Food in the Last Year: Patient unable to answer    Ran Out of Food in the Last Year: Patient unable to answer  Transportation Needs: Patient Unable To Answer (01/19/2024)   PRAPARE - Transportation    Lack of Transportation (Medical): Patient unable to answer    Lack of Transportation (Non-Medical): Patient unable to answer  Physical Activity: Insufficiently Active (08/20/2023)   Exercise Vital Sign    Days of Exercise per Week: 1 day    Minutes of Exercise per Session: 70 min  Stress: No Stress Concern Present (08/20/2023)   [employer] of Occupational Health - Occupational Stress  Questionnaire    Feeling of Stress : Not at all  Social Connections: Patient Unable To Answer (01/19/2024)   Social Connection and Isolation Panel    Frequency of Communication with Friends and Family: Patient unable to answer    Frequency of Social Gatherings with Friends and Family: Patient unable to answer    Attends Religious Services: Patient unable to answer    Active Member of Clubs or Organizations: Patient unable to answer    Attends Banker Meetings: Patient unable to answer    Marital Status: Patient unable to  answer  Intimate Partner Violence: Patient Unable To Answer (01/19/2024)   Humiliation, Afraid, Rape, and Kick questionnaire    Fear of Current or Ex-Partner: Patient unable to answer    Emotionally Abused: Patient unable to answer    Physically Abused: Patient unable to answer    Sexually Abused: Patient unable to answer    FAMILY HISTORY: Family History  Problem Relation Age of Onset   Heart attack Father      PHYSICAL EXAMINATION: ECOG PERFORMANCE STATUS: 3 - Symptomatic, >50% confined to bed  Vitals:   01/22/24 0600 01/22/24 0745  BP: (!) 142/67   Pulse: 72   Resp: (!) 8   Temp:    SpO2: 93% 90%   Filed Weights   01/19/24 0800  Weight: 134 lb 14.7 oz (61.2 kg)    GENERAL: alert, +Respiratory distress + chronically ill-appearing SKIN: + Husky skin color, texture, turgor are normal, no rashes or significant lesions EYES: normal, conjunctiva are pink and non-injected, sclera clear OROPHARYNX: no exudate, no erythema and lips, buccal mucosa, and tongue normal  NECK: supple, thyroid  normal size, non-tender, without nodularity LYMPH: no palpable lymphadenopathy in the cervical, axillary or inguinal LUNGS: + Coarse to auscultation  HEART: regular rate & rhythm and no murmurs and no lower extremity edema ABDOMEN: abdomen soft, non-tender and normal bowel sounds MUSCULOSKELETAL: no cyanosis of digits and no clubbing  PSYCH: alert & oriented x 3 with fluent speech NEURO: no focal motor/sensory deficits   ALLERGIES:  is allergic to iohexol .  MEDICATIONS:  Current Facility-Administered Medications  Medication Dose Route Frequency Provider Last Rate Last Admin   acetaminophen  (TYLENOL ) tablet 650 mg  650 mg Oral Q6H PRN Emokpae, Courage, MD       Or   acetaminophen  (TYLENOL ) suppository 650 mg  650 mg Rectal Q6H PRN Emokpae, Courage, MD       albuterol  (PROVENTIL ) (2.5 MG/3ML) 0.083% nebulizer solution 2.5 mg  2.5 mg Nebulization Q2H PRN Emokpae, Courage, MD       aspirin   EC tablet 81 mg  81 mg Oral Q breakfast Emokpae, Courage, MD   81 mg at 01/22/24 0831   bisacodyl  (DULCOLAX) suppository 10 mg  10 mg Rectal Daily PRN Pearlean Manus, MD       Chlorhexidine  Gluconate Cloth 2 % PADS 6 each  6 each Topical Q0600 Emokpae, Courage, MD   6 each at 01/21/24 1754   dextromethorphan -guaiFENesin  (MUCINEX  DM) 30-600 MG per 12 hr tablet 1 tablet  1 tablet Oral BID Emokpae, Courage, MD   1 tablet at 01/22/24 1014   dextrose  50 % solution 25 g  25 g Intravenous Once Cindy Garnette POUR, MD       gabapentin  (NEURONTIN ) capsule 600 mg  600 mg Oral TID Arshad, Mohsin A, MD   600 mg at 01/22/24 1014   heparin  injection 5,000 Units  5,000 Units Subcutaneous Q8H Pearlean Manus, MD   5,000  Units at 01/22/24 0618   insulin  aspart (novoLOG ) injection 0-15 Units  0-15 Units Subcutaneous TID WC Emokpae, Courage, MD   5 Units at 01/22/24 0830   insulin  aspart (novoLOG ) injection 0-5 Units  0-5 Units Subcutaneous QHS Pearlean Manus, MD   2 Units at 01/21/24 2300   insulin  glargine-yfgn (SEMGLEE ) injection 10 Units  10 Units Subcutaneous QHS Cindy Garnette POUR, MD   10 Units at 01/21/24 2300   ipratropium-albuterol  (DUONEB) 0.5-2.5 (3) MG/3ML nebulizer solution 3 mL  3 mL Nebulization TID Pearlean Manus, MD   3 mL at 01/22/24 0745   lactated ringers  infusion   Intravenous Continuous Cindy Garnette POUR, MD 75 mL/hr at 01/22/24 0601 Infusion Verify at 01/22/24 9398   methylPREDNISolone  sodium succinate (SOLU-MEDROL ) 40 mg/mL injection 40 mg  40 mg Intravenous Daily Meade Verdon RAMAN, MD   40 mg at 01/22/24 1014   nicotine  (NICODERM CQ  - dosed in mg/24 hours) patch 21 mg  21 mg Transdermal Daily Emokpae, Courage, MD   21 mg at 01/22/24 1016   ondansetron  (ZOFRAN ) tablet 4 mg  4 mg Oral Q6H PRN Pearlean Manus, MD       Or   ondansetron  (ZOFRAN ) injection 4 mg  4 mg Intravenous Q6H PRN Pearlean Manus, MD       Oral care mouth rinse  15 mL Mouth Rinse 4 times per day Pearlean Manus, MD   15 mL at  01/21/24 2200   Oral care mouth rinse  15 mL Mouth Rinse PRN Emokpae, Courage, MD       pantoprazole  (PROTONIX ) EC tablet 40 mg  40 mg Oral Daily Emokpae, Courage, MD   40 mg at 01/22/24 1013   polyethylene glycol (MIRALAX  / GLYCOLAX ) packet 17 g  17 g Oral Daily PRN Pearlean Manus, MD       rosuvastatin  (CRESTOR ) tablet 10 mg  10 mg Oral Daily Emokpae, Courage, MD   10 mg at 01/22/24 1013   sodium chloride  flush (NS) 0.9 % injection 3 mL  3 mL Intravenous Q12H Emokpae, Courage, MD   3 mL at 01/21/24 2200   sodium chloride  flush (NS) 0.9 % injection 3 mL  3 mL Intravenous Q12H Emokpae, Courage, MD   3 mL at 01/21/24 2200   sodium chloride  flush (NS) 0.9 % injection 3 mL  3 mL Intravenous PRN Emokpae, Courage, MD       traZODone  (DESYREL ) tablet 50 mg  50 mg Oral QHS PRN Emokpae, Courage, MD         LABORATORY DATA:  I have reviewed the data as listed Lab Results  Component Value Date   WBC 14.1 (H) 01/22/2024   HGB 11.1 (L) 01/22/2024   HCT 35.4 (L) 01/22/2024   MCV 98.6 01/22/2024   PLT 243 01/22/2024   Recent Labs    01/19/24 0726 01/20/24 0620 01/21/24 0332 01/22/24 0324  NA 139 142 141 140  K 4.3 3.8 4.0 4.2  CL 108 112* 113* 111  CO2 21* 20* 17* 21*  GLUCOSE 264* 90 94 239*  BUN 40* 48* 51* 54*  CREATININE 1.77* 1.89* 1.85* 2.02*  CALCIUM  9.0 9.2 8.5* 8.5*  GFRNONAA 41* 37* 38* 35*  PROT 6.4*  --  4.9* 5.1*  ALBUMIN 3.1*  --  2.4* 2.4*  AST 26  --  45* 36  ALT 26  --  30 30  ALKPHOS 104  --  95 85  BILITOT 0.3  --  0.6 0.6    RADIOGRAPHIC STUDIES:  I have personally reviewed the radiological images as listed and agreed with the findings in the report. DG Chest 1 View Result Date: 01/21/2024 CLINICAL DATA:  Short of breath EXAM: CHEST  1 VIEW COMPARISON:  Radiograph 01/20/2024, CT 01/19/2024 FINDINGS: Normal cardiac silhouette. Persistent large RIGHT perihilar mass. Persistent widening of the upper mediastinum related lymphadenopathy. Moderate LEFT effusion  slightly improved. IMPRESSION: 1. RIGHT perihilar mass concerning for malignancy. Mediastinal lymphadenopathy 2. Interval improvement in RIGHT pleural effusion compared to CT 01/19/2024 3. Persistent LEFT pleural effusion Electronically Signed   By: Jackquline Boxer M.D.   On: 01/21/2024 09:56   DG CHEST PORT 1 VIEW Result Date: 01/20/2024 CLINICAL DATA:  [id number] S/P right thoracentesis [id number] EXAM: PORTABLE CHEST - 1 VIEW COMPARISON:  January 19, 2023 FINDINGS: Near complete resolution of the right pleural effusion. Persistent moderate left pleural effusion with left basilar atelectasis. No pneumothorax. Mild cardiomegaly. Similar right hilar and paratracheal fullness corresponding to the lymphadenopathy. No acute fracture or destructive lesions. Multilevel thoracic osteophytosis. IMPRESSION: 1. Near complete resolution of the right pleural effusion. No pneumothorax. 2. Persistent moderate volume left pleural effusion. Electronically Signed   By: Rogelia Myers M.D.   On: 01/20/2024 15:48   ECHOCARDIOGRAM COMPLETE Result Date: 01/20/2024    ECHOCARDIOGRAM REPORT   Patient Name:   MIKAELE STECHER Date of Exam: 01/20/2024 Medical Rec #:  [id number]          Height:       74.0 in Accession #:    [id number]         Weight:       134.9 lb Date of Birth:  Jan 05, 1951          BSA:          1.839 m Patient Age:    73 years           BP:           140/51 mmHg Patient Gender: M                  HR:           70 bpm. Exam Location:  Inpatient Procedure: 2D Echo, Color Doppler and Cardiac Doppler (Both Spectral and Color            Flow Doppler were utilized during procedure). Indications:    Dyspnea R06.00  History:        Patient has no prior history of Echocardiogram examinations.                 Risk Factors:Hypertension and Diabetes.  Sonographer:    Tinnie Gosling RDCS Referring Phys: [id number] COURAGE EMOKPAE IMPRESSIONS  1. Left ventricular ejection fraction, by estimation, is 60 to 65%. The left ventricle has normal  function. The left ventricle has no regional wall motion abnormalities. There is mild left ventricular hypertrophy. Left ventricular diastolic parameters are consistent with Grade I diastolic dysfunction (impaired relaxation).  2. Right ventricular systolic function is mildly reduced. The right ventricular size is mildly enlarged. There is mildly elevated pulmonary artery systolic pressure. The estimated right ventricular systolic pressure is 40.0 mmHg.  3. Moderate pericardial effusion. The pericardial effusion is anterior to the right ventricle and surrounding the apex. BP 140/51 mmHg, HR 70 bpm at time of echo. IVC is dilated and noncollapsible. No RV diastolic collapse. Hepatic vein doppler challenging to interpret. No significant RA collapse. Mild respiratory related septal shift. No significant variation in mitral inflow. Finding in combination indicate no  definite tamponade, but with dilated IVC may indicate early constrictive/tamponade physiology. Consider repeat limited echo in 2-3 days or with change in hemodynamics. Large pleural effusion in the left lateral region.  4. The mitral valve is grossly normal. Trivial mitral valve regurgitation. No evidence of mitral stenosis.  5. The aortic valve is tricuspid. Aortic valve regurgitation is not visualized. No aortic stenosis is present.  6. The inferior vena cava is dilated in size with <50% respiratory variability, suggesting right atrial pressure of 15 mmHg. FINDINGS  Left Ventricle: Left ventricular ejection fraction, by estimation, is 60 to 65%. The left ventricle has normal function. The left ventricle has no regional wall motion abnormalities. The left ventricular internal cavity size was normal in size. There is  mild left ventricular hypertrophy. Left ventricular diastolic parameters are consistent with Grade I diastolic dysfunction (impaired relaxation). Right Ventricle: The right ventricular size is mildly enlarged. No increase in right ventricular  wall thickness. Right ventricular systolic function is mildly reduced. There is mildly elevated pulmonary artery systolic pressure. The tricuspid regurgitant  velocity is 2.50 m/s, and with an assumed right atrial pressure of 15 mmHg, the estimated right ventricular systolic pressure is 40.0 mmHg. Left Atrium: Left atrial size was normal in size. Right Atrium: Right atrial size was normal in size. Pericardium: A moderately sized pericardial effusion is present. The pericardial effusion is anterior to the right ventricle and surrounding the apex. There is no evidence of cardiac tamponade. Presence of epicardial fat layer. Mitral Valve: The mitral valve is grossly normal. Trivial mitral valve regurgitation. No evidence of mitral valve stenosis. Tricuspid Valve: The tricuspid valve is normal in structure. Tricuspid valve regurgitation is trivial. No evidence of tricuspid stenosis. Aortic Valve: The aortic valve is tricuspid. Aortic valve regurgitation is not visualized. No aortic stenosis is present. Pulmonic Valve: The pulmonic valve was normal in structure. Pulmonic valve regurgitation is trivial. No evidence of pulmonic stenosis. Aorta: The aortic root is normal in size and structure. Venous: The inferior vena cava is dilated in size with less than 50% respiratory variability, suggesting right atrial pressure of 15 mmHg. IAS/Shunts: The interatrial septum is aneurysmal. The interatrial septum appears to be lipomatous. The interatrial septum was not well visualized. Additional Comments: There is a large pleural effusion in the left lateral region.  LEFT VENTRICLE PLAX 2D LVIDd:         4.40 cm   Diastology LVIDs:         3.10 cm   LV e' medial:    5.00 cm/s LV PW:         1.00 cm   LV E/e' medial:  11.7 LV IVS:        1.00 cm   LV e' lateral:   8.05 cm/s LVOT diam:     2.00 cm   LV E/e' lateral: 7.3 LV SV:         68 LV SV Index:   37 LVOT Area:     3.14 cm  RIGHT VENTRICLE             IVC RV S prime:     21.20 cm/s   IVC diam: 2.50 cm TAPSE (M-mode): 1.3 cm LEFT ATRIUM           Index        RIGHT ATRIUM           Index LA diam:      2.80 cm 1.52 cm/m   RA Area:  11.20 cm LA Vol (A4C): 42.9 ml 23.33 ml/m  RA Volume:   20.30 ml  11.04 ml/m  AORTIC VALVE LVOT Vmax:   94.60 cm/s LVOT Vmean:  70.200 cm/s LVOT VTI:    0.217 m  AORTA Ao Root diam: 3.20 cm Ao Asc diam:  3.40 cm MITRAL VALVE               TRICUSPID VALVE MV Area (PHT): 2.82 cm    TR Peak grad:   25.0 mmHg MV E velocity: 58.70 cm/s  TR Vmax:        250.00 cm/s MV A velocity: 64.30 cm/s MV E/A ratio:  0.91        SHUNTS                            Systemic VTI:  0.22 m                            Systemic Diam: 2.00 cm Soyla Merck MD Electronically signed by Soyla Merck MD Signature Date/Time: 01/20/2024/10:21:17 AM    Final    CT Angio Chest PE W and/or Wo Contrast Result Date: 01/19/2024 CLINICAL DATA:  Pulmonary embolism (PE) suspected, low to intermediate prob, positive D-dimer EXAM: CT ANGIOGRAPHY CHEST WITH CONTRAST TECHNIQUE: Multidetector CT imaging of the chest was performed using the standard protocol during bolus administration of intravenous contrast. Multiplanar CT image reconstructions and MIPs were obtained to evaluate the vascular anatomy. RADIATION DOSE REDUCTION: This exam was performed according to the departmental dose-optimization program which includes automated exposure control, adjustment of the mA and/or kV according to patient size and/or use of iterative reconstruction technique. CONTRAST:  60mL OMNIPAQUE  IOHEXOL  350 MG/ML SOLN COMPARISON:  CT abdomen 01/19/2022, chest 02/16/2006 FINDINGS: Cardiovascular: SVC narrowing by mediastinal mass, without significant collateral filling. Heart size normal. Moderate pericardial effusion. Satisfactory opacification of pulmonary arteries noted, and there is no evidence of pulmonary emboli. Mild left coronary calcifications. Adequate contrast opacification of the thoracic aorta with no  evidence of dissection, aneurysm, or stenosis. There is classic 3-vessel brachiocephalic arch anatomy without proximal stenosis. Scattered calcified plaque in the arch and descending thoracic aorta. Mediastinum/Nodes: Bulky bilateral supraclavicular adenopathy. Confluent anterior mediastinal and right paratracheal mass. Subcarinal and pre-vascular adenopathy. Confluent right hilar mass or adenopathy, encasing branches of the right upper lobe pulmonary artery. Mild left axillary adenopathy. Lungs/Pleura: Moderate bilateral pleural effusions. Atelectasis of most of the left lobe, and dependent aspects of lingula in right lower lobe. Scattered bilateral pulmonary nodules, largest on the left none mm subpleural in the upper lobe (7:91) , on the right in the superior segment lower lobe 7 mm (110:7) . no pneumothorax. Upper Abdomen: Left para-aortic adenopathy up to 1.7 cm. Extensive coarse calcifications in the pancreatic body and tail suggesting chronic pancreatitis. 7.4 cm complex cystic mass at the right renal hilum, previously 6 cm. Musculoskeletal: Bilateral shoulder DJD. Vertebral endplate spurring at multiple levels in the mid thoracic spine. Review of the MIP images confirms the above findings. IMPRESSION: 1. Negative for acute PE or thoracic aortic dissection. 2. Bulky supraclavicular, mediastinal, and right hilar adenopathy, with SVC narrowing. Metastatic disease or lymphoma primary considerations. Follow-up recommended. 3. New moderate bilateral pleural effusions with left greater than right atelectasis. 4. Increased moderate pericardial effusion. 5. 7.4 cm complex cystic mass at the right renal hilum, previously 6 cm. 6.  Aortic Atherosclerosis (ICD10-I70.0). Electronically Signed  By: JONETTA Faes M.D.   On: 01/19/2024 13:41   CT Soft Tissue Neck W Contrast Result Date: 01/19/2024 CLINICAL DATA:  Left neck soft tissue mass. Recent antibiotic treatment for tonsillitis. Difficulty breathing. Hypoxia. EXAM:  CT NECK WITH CONTRAST TECHNIQUE: Multidetector CT imaging of the neck was performed using the standard protocol following the bolus administration of intravenous contrast. RADIATION DOSE REDUCTION: This exam was performed according to the departmental dose-optimization program which includes automated exposure control, adjustment of the mA and/or kV according to patient size and/or use of iterative reconstruction technique. CONTRAST:  60mL OMNIPAQUE  IOHEXOL  350 MG/ML SOLN COMPARISON:  None Available. FINDINGS: Pharynx and larynx: Mildly limited assessment due to mild motion artifact. Symmetric tonsils without substantial enlargement. Normal epiglottis. Patent airway. Small amount of low-density, mildly nodular material posterolateral E on the right in the subglottic larynx and proximal trachea (series 3, images 88 and 93) individually measuring up to 1.5 cm in craniocaudal dimension. Mild retropharyngeal edema or minimal fluid without organized retropharyngeal or peritonsillar fluid collection. Salivary glands: No inflammation, mass, or stone. Thyroid : Unremarkable. Lymph nodes: Necrotic lymphadenopathy in the neck bilaterally including conglomerate bilateral level IV/supraclavicular nodal masses measuring 5.0 x 4.3 x 6.5 cm on the right and 4.6 x 3.6 x 6.6 cm on the left. Additional smaller pathologic lymph nodes more superiorly in the neck bilaterally including a 1.7 cm short axis necrotic left submandibular node. Vascular: The internal jugular veins are compressed in the lower neck by the nodal masses but are patent more superiorly in the neck. Calcified atherosclerosis in the carotid bulbs with at least a moderate stenosis of the proximal right ICA. Limited intracranial: Unremarkable. Visualized orbits: Bilateral cataract extraction. Mastoids and visualized paranasal sinuses: Minimal mucosal thickening in the paranasal sinuses. Clear mastoid air cells. Skeleton: Moderate cervical spondylosis.  Dental caries.  Upper chest: More fully evaluated on the separately reported contemporaneous CT of the chest. Other: Relatively widespread soft tissue swelling in the left greater than right neck, nonspecific but may be secondary to venous obstruction in the superior mediastinum. IMPRESSION: 1. Necrotic lymphadenopathy in the neck bilaterally consistent with metastatic disease. See the separately reported chest CT for thoracic findings. 2. Small amount of low-density material in the subglottic larynx and proximal trachea, indeterminate for adherent mucus or mass. Correlate with direct visualization. 3. Diffuse soft tissue swelling in the neck which may be secondary to venous obstruction. Electronically Signed   By: Dasie Hamburg M.D.   On: 01/19/2024 13:31   CT Head Wo Contrast Result Date: 01/19/2024 CLINICAL DATA:  Altered mental status.  Ataxia. EXAM: CT HEAD WITHOUT CONTRAST TECHNIQUE: Contiguous axial images were obtained from the base of the skull through the vertex without intravenous contrast. RADIATION DOSE REDUCTION: This exam was performed according to the departmental dose-optimization program which includes automated exposure control, adjustment of the mA and/or kV according to patient size and/or use of iterative reconstruction technique. COMPARISON:  None Available. FINDINGS: Brain: There is no evidence of an acute infarct, intracranial hemorrhage, mass, midline shift, or extra-axial fluid collection. There is mild cerebral atrophy. Periventricular white matter hypodensities are nonspecific but compatible with mild chronic small vessel ischemic disease. Vascular: Calcified atherosclerosis at the skull base. No hyperdense vessel. Skull: No acute fracture or suspicious lesion. Sinuses/Orbits: Minimal mucosal thickening in the paranasal sinuses. Clear mastoid air cells. Bilateral cataract extraction. Other: None. IMPRESSION: 1. No evidence of acute intracranial abnormality. 2. Mild chronic small vessel ischemic  disease. Electronically Signed   By: Dasie  Derrill M.D.   On: 01/19/2024 13:15   DG Chest Port 1 View Result Date: 01/19/2024 CLINICAL DATA:  Chest pain EXAM: PORTABLE CHEST 1 VIEW COMPARISON:  01/09/2024 FINDINGS: Bibasilar collapse/consolidation with moderate left and small right pleural effusions. Interstitial markings are diffusely coarsened with chronic features. Persistent right paratracheal and right mediastinal/hilar fullness. No acute bony abnormality. Telemetry leads overlie the chest. IMPRESSION: 1. Bibasilar collapse/consolidation with moderate left and small right pleural effusions, progressive in the interval since prior study. 2. Persistent right paratracheal and right mediastinal/hilar fullness. CT chest with contrast recommended to further evaluate. Electronically Signed   By: Camellia Candle M.D.   On: 01/19/2024 07:59   DG Chest 2 View Result Date: 01/09/2024 CLINICAL DATA:  Shortness of breath EXAM: CHEST - 2 VIEW COMPARISON:  02/22/2006 FINDINGS: Cardiac shadow is within normal limits. Aortic calcifications are noted. Bilateral pleural effusions are seen left greater than right. Fullness is noted in the right perihilar region. This may represent underlying adenopathy and or mass. CT of the chest with contrast is recommended for further evaluation. Possible nodular density in the right base is noted. No acute bony abnormality is noted. IMPRESSION: Fullness in the right perihilar region and right paratracheal region. Changes are suspicious for underlying lymphadenopathy/mass. CT with contrast is recommended for further evaluation. Bilateral pleural effusions left greater than right. Electronically Signed   By: Oneil Devonshire M.D.   On: 01/09/2024 03:54     The total time spent in the appointment was 55 minutes encounter with patients including review of chart and various tests results, discussions about plan of care and coordination of care plan   All questions were answered. The patient  knows to call the clinic with any problems, questions or concerns. No barriers to learning was detected.  Olam PARAS Lyell Clugston, NP 7/30/202510:20 AM

## 2024-01-22 NOTE — Progress Notes (Signed)
 PROGRESS NOTE    Oscar Mills  FMW:[id number] DOB: 1951/04/16 DOA: 01/19/2024 PCP: Donzella Lauraine SAILOR, DO    Chief Complaint  Patient presents with   Respiratory Distress    Wife states rx ammoxicillin 2 weeks ago for tonsillitis, took 7 of 10 days abx, went to [facility] for diff breathing approx 1 week ago, wife suspects abx reaction    Brief Narrative:  73 year old male, history of tobacco abuse, alcohol  use, COPD, HTN, DM 2, peripheral arterial disease, right renal mass, presented to [facility] for symptoms of shortness of breath, respiratory distress, was hypoxic.  Was placed on BiPAP in the ED.  Was previously seen at [facility] ED on 01/09/2024, was prescribed Augmentin  and inhalers, however symptoms worsened.  He he was seen by oncologist (Dr. Annah Skene) on 03/21/2021 for weight loss.  He also had a CT scan of the abdomen and pelvis in 7/23 that showed cystic subcentimeter right renal lesion concerning for renal cell carcinoma.  Renal mass protocol CT or MRI was recommended however patient did not follow through. CT scan of the head without any acute findings,  CT scan neck soft tissue shows necrotic lymphadenopathy in the neck bilaterally consistent with metastatic disease.  Small amount of low-density material in the subglottic larynx and proximal trachea, indeterminate for mucosal mass.  Diffuse soft tissue swelling in the neck may be due to venous obstruction. CTA chest negative for PE or thoracic dissection.  Bulky supraclavicular, mediastinal and right hilar adenopathy.  SVC narrowing.  Metastatic disease or lymphoma primary considerations.  Moderate bilateral pleural effusions.  Moderate pericardial effusion.  7.4 cm complex cystic mass of the right renal hilum, previously 6 cm.   Assessment & Plan:   Principal Problem:   Acute respiratory failure with hypoxia (HCC) Active Problems:   Lymphadenopathy   PAD (peripheral artery disease) (HCC)   Hypertension associated with  diabetes (HCC)   Uncontrolled diabetes mellitus with hyperglycemia (HCC)   Acute hypoxic respiratory failure (HCC)   Pleural effusion   Pericardial effusion   Lymphadenopathy, mediastinal   SVC syndrome   AKI (acute kidney injury) (HCC)   COPD with acute exacerbation (HCC)  #1 acute hypoxemic respiratory failure - Likely multifactorial secondary to extensive adenopathy with concern for metastatic disease versus lymphoma as well as pleural effusion noted. - Patient noted initially on NIV initially subsequently transition to nonrebreather and currently on 15 L high flow nasal cannula. - Patient status post thoracentesis on the right side with removal of 2 L of fluid. - Currently on 15 L high flow nasal cannula. - Biopsies pending. - Continue Mucinex , scheduled DuoNebs, IV Solu-Medrol . -Radiation oncology following and patient for possible simulation and treatment as early as tomorrow 02/07/24. - PCCM was following but have signed off as of 01/22/2024.  2.  Extensive mediastinal/right hilar and right supraclavicular lymphadenopathy/concern for SVC syndrome. - Patient was seen by PCCM who consulted radiation oncology and plan was for radiation therapy however initially patient declined. - Patient also seen in consultation by IR and underwent ultrasound-guided right supraclavicular mass biopsy on 01/21/2024 with results pending. - Patient assessed for SVC stent however per IR risks outweigh benefits at this time and if symptoms were to worsen then reengage IR. - Patient reassessed by radiation oncology who discussed with family natural history of SVC syndrome, general treatment, role of radiotherapy and management and family have agreed that they would like to receive simulation and radiation treatment with eventual plans for transfer of his radiation  treatment to  once clinically stable for transfer and are awaiting diagnosis of malignancy prior to starting treatment. - Will consult with  hematology/oncology for further evaluation and recommendations and management. - Radiation oncology following and appreciate the input and recommendations.  3.  Right renal cell mass -?  Metastatic malignancy versus other etiologies. - Patient status post ultrasound-guided supraclavicular mass biopsy with results pending.  4.  AKI -??  Etiology -Noted to have received CT angiogram chest with contrast. - Check a UA urinalysis, urine sodium, urine creatinine. - Check a renal ultrasound. - Patient with urine output of 1.45 L over the past 24 hours - Place on IV fluids. - Follow.  5.  Acute COPD exacerbation -Patient on 15 L high flow nasal cannula. - Continue IV Solu-Medrol , scheduled DuoNebs, Mucinex , PPI. - Follow.  6.  PAD -Continue aspirin , Crestor . - Plavix  on hold.  7.  Diabetes mellitus type 2./Diabetic neuropathy -Hemoglobin A1c 10.4. - Patient with CBG of 238 this morning. - Patient noted to be on Lantus  30 units daily as well as SSI. - Increase Semglee  to 15 units daily. -Start NovoLog  3 units 3 times daily with meals. - SSI. -Continue gabapentin . - Diabetes coordinator following.  8.  Pericardial effusion - 2D echo done with moderate pericardial effusion but no definite cardiac tamponade noted. - Recommendations are for repeat limited 2D echo in 2 to 3 days. - Follow.    DVT prophylaxis: Heparin  Code Status: Full Family Communication: Updated patient, wife, sister at bedside. Disposition: TBD  Status is: Inpatient Remains inpatient appropriate because: Severity of illness   Consultants:  PCCM: Dr. Meade 01/20/2024 Interventional radiology: Dr. Jennefer 01/21/2024 Radiation oncology: Dr. Shannon 01/20/2024 Hematology/oncology  Procedures:  Ultrasound-guided right supraclavicular mass biopsy per IR: Dr. Jennefer 01/21/2024 Image guided thoracentesis per PCCM: Dr.Desai 01/20/2024 CT head 01/19/2024 CT angiogram chest 01/19/2024 CT soft tissue neck  01/19/2024 Chest x-ray 01/19/2024, 01/20/2024, 01/21/2024 2D echo 01/20/2024   Antimicrobials:  Anti-infectives (From admission, onward)    Start     Dose/Rate Route Frequency Ordered Stop   01/19/24 1730  azithromycin  (ZITHROMAX ) 500 mg in sodium chloride  0.9 % 250 mL IVPB  Status:  Discontinued        500 mg 250 mL/hr over 60 Minutes Intravenous Every 24 hours 01/19/24 1633 01/21/24 0926   01/19/24 1630  azithromycin  (ZITHROMAX ) tablet 500 mg  Status:  Discontinued        500 mg Oral Daily 01/19/24 1628 01/19/24 1633         Subjective: Patient sitting up in bed.  Denies any significant excessive shortness of breath.  On 15 L high flow nasal cannula.  Patient with hoarseness.  Wife and sister at bedside.  Objective: Vitals:   01/22/24 1422 01/22/24 1500 01/22/24 1600 01/22/24 1700  BP:  (!) 137/50 (!) 138/50 (!) 152/72  Pulse:  73 69 71  Resp:  (!) 23 (!) 22 18  Temp:   (!) 97.4 F (36.3 C)   TempSrc:   Oral   SpO2: 96% (!) 75% 94% 93%  Weight:      Height:        Intake/Output Summary (Last 24 hours) at 01/22/2024 1739 Last data filed at 01/22/2024 1723 Gross per 24 hour  Intake 1209.69 ml  Output 1075 ml  Net 134.69 ml   Filed Weights   01/19/24 0800  Weight: 61.2 kg    Examination:  General exam: Appears calm and comfortable  Respiratory system: Clear to auscultation.  No  wheezes, no crackles, no rhonchi.  On 15 L high flow nasal cannula.  Cardiovascular system: S1 & S2 heard, RRR. No JVD, murmurs, rubs, gallops or clicks. No pedal edema. Gastrointestinal system: Abdomen is nondistended, soft and nontender. No organomegaly or masses felt. Normal bowel sounds heard. Central nervous system: Alert and oriented. No focal neurological deficits. Extremities: Symmetric 5 x 5 power. Skin: No rashes, lesions or ulcers Psychiatry: Judgement and insight appear normal. Mood & affect appropriate.     Data Reviewed: I have personally reviewed following labs and imaging  studies  CBC: Recent Labs  Lab 01/19/24 0726 01/20/24 0620 01/21/24 0332 01/22/24 0324  WBC 10.1 14.7* 18.8* 14.1*  NEUTROABS 7.9*  --  15.3*  --   HGB 12.4* 11.3* 10.2* 11.1*  HCT 39.1 35.9* 33.6* 35.4*  MCV 98.5 96.8 98.8 98.6  PLT 325 301 255 243    Basic Metabolic Panel: Recent Labs  Lab 01/19/24 0726 01/20/24 0620 01/21/24 0332 01/22/24 0324  NA 139 142 141 140  K 4.3 3.8 4.0 4.2  CL 108 112* 113* 111  CO2 21* 20* 17* 21*  GLUCOSE 264* 90 94 239*  BUN 40* 48* 51* 54*  CREATININE 1.77* 1.89* 1.85* 2.02*  CALCIUM  9.0 9.2 8.5* 8.5*  MG  --  2.0  --   --   PHOS  --  4.0  --   --     GFR: Estimated Creatinine Clearance: 29 mL/min (A) (by C-G formula based on SCr of 2.02 mg/dL (H)).  Liver Function Tests: Recent Labs  Lab 01/19/24 0726 01/21/24 0332 01/22/24 0324  AST 26 45* 36  ALT 26 30 30   ALKPHOS 104 95 85  BILITOT 0.3 0.6 0.6  PROT 6.4* 4.9* 5.1*  ALBUMIN 3.1* 2.4* 2.4*    CBG: Recent Labs  Lab 01/21/24 1617 01/21/24 2116 01/22/24 0756 01/22/24 1159 01/22/24 1556  GLUCAP 187* 221* 238* 260* 366*     Recent Results (from the past 240 hours)  Resp panel by RT-PCR (RSV, Flu A&B, Covid) Anterior Nasal Swab     Status: None   Collection Time: 01/19/24  7:20 AM   Specimen: Anterior Nasal Swab  Result Value Ref Range Status   SARS Coronavirus 2 by RT PCR NEGATIVE NEGATIVE Final    Comment: (NOTE) SARS-CoV-2 target nucleic acids are NOT DETECTED.  The SARS-CoV-2 RNA is generally detectable in upper respiratory specimens during the acute phase of infection. The lowest concentration of SARS-CoV-2 viral copies this assay can detect is 138 copies/mL. A negative result does not preclude SARS-Cov-2 infection and should not be used as the sole basis for treatment or other patient management decisions. A negative result may occur with  improper specimen collection/handling, submission of specimen other than nasopharyngeal swab, presence of viral  mutation(s) within the areas targeted by this assay, and inadequate number of viral copies(<138 copies/mL). A negative result must be combined with clinical observations, patient history, and epidemiological information. The expected result is Negative.  Fact Sheet for Patients:  BloggerCourse.com  Fact Sheet for Healthcare Providers:  SeriousBroker.it  This test is no t yet approved or cleared by the [place]  FDA and  has been authorized for detection and/or diagnosis of SARS-CoV-2 by FDA under an Emergency Use Authorization (EUA). This EUA will remain  in effect (meaning this test can be used) for the duration of the COVID-19 declaration under Section 564(b)(1) of the Act, 21 U.S.C.section 360bbb-3(b)(1), unless the authorization is terminated  or revoked sooner.  Influenza A by PCR NEGATIVE NEGATIVE Final   Influenza B by PCR NEGATIVE NEGATIVE Final    Comment: (NOTE) The Xpert Xpress SARS-CoV-2/FLU/RSV plus assay is intended as an aid in the diagnosis of influenza from Nasopharyngeal swab specimens and should not be used as a sole basis for treatment. Nasal washings and aspirates are unacceptable for Xpert Xpress SARS-CoV-2/FLU/RSV testing.  Fact Sheet for Patients: BloggerCourse.com  Fact Sheet for Healthcare Providers: SeriousBroker.it  This test is not yet approved or cleared by the [place]  FDA and has been authorized for detection and/or diagnosis of SARS-CoV-2 by FDA under an Emergency Use Authorization (EUA). This EUA will remain in effect (meaning this test can be used) for the duration of the COVID-19 declaration under Section 564(b)(1) of the Act, 21 U.S.C. section 360bbb-3(b)(1), unless the authorization is terminated or revoked.     Resp Syncytial Virus by PCR NEGATIVE NEGATIVE Final    Comment: (NOTE) Fact Sheet for  Patients: BloggerCourse.com  Fact Sheet for Healthcare Providers: SeriousBroker.it  This test is not yet approved or cleared by the [place]  FDA and has been authorized for detection and/or diagnosis of SARS-CoV-2 by FDA under an Emergency Use Authorization (EUA). This EUA will remain in effect (meaning this test can be used) for the duration of the COVID-19 declaration under Section 564(b)(1) of the Act, 21 U.S.C. section 360bbb-3(b)(1), unless the authorization is terminated or revoked.  Performed at [facility], [address] [place], [place] [address]   MRSA Next Gen by PCR, Nasal     Status: None   Collection Time: 01/19/24  7:24 PM   Specimen: Nasal Mucosa; Nasal Swab  Result Value Ref Range Status   MRSA by PCR Next Gen NOT DETECTED NOT DETECTED Final    Comment: (NOTE) The GeneXpert MRSA Assay (FDA approved for NASAL specimens only), is one component of a comprehensive MRSA colonization surveillance program. It is not intended to diagnose MRSA infection nor to guide or monitor treatment for MRSA infections. Test performance is not FDA approved in patients less than 21 years old. Performed at [facility], [address], [place], [place] [address]   Body fluid culture w Gram Stain     Status: None (Preliminary result)   Collection Time: 01/20/24  2:59 PM   Specimen: Pleural Fluid  Result Value Ref Range Status   Specimen Description   Final    PLEURAL Performed at [facility], [address], [place], [place] [address]    Special Requests   Final    NONE Performed at [facility], [address], [place], [place] [address]    Gram Stain   Final    WBC PRESENT, PREDOMINANTLY MONONUCLEAR NO ORGANISMS SEEN CYTOSPIN SMEAR    Culture   Final    NO GROWTH 2 DAYS Performed at [facility], [address], [place], [place] [address]    Report  Status PENDING  Incomplete         Radiology Studies: US  CORE BIOPSY (LYMPH NODES) Result Date: 01/22/2024 INDICATION: 73 year old male with indeterminate cervical lymphadenopathy. EXAM: Ultrasound-guided right supraclavicular mass biopsy. MEDICATIONS: None. ANESTHESIA/SEDATION: None. FLUOROSCOPY TIME:  None. COMPLICATIONS: None immediate. PROCEDURE: Informed written consent was obtained from the patient after a thorough discussion of the procedural risks, benefits and alternatives. All questions were addressed. Maximal Sterile Barrier Technique was utilized including caps, mask, sterile gowns, sterile gloves, sterile drape, hand hygiene and skin antiseptic. A timeout was performed prior  to the initiation of the procedure. Preprocedure ultrasound evaluation demonstrated multifocal heterogeneously hypoechoic masses in the right neck and supraclavicular region compatible with lymphadenopathy. The largest right supraclavicular mass was then targeted for biopsy. Subdermal Local anesthesia was administered at the planned needle entry site with 1% lidocaine . A small skin nick was made. Under direct ultrasound visualization, a 17 gauge coaxial introducer needle was directed to the periphery of the mass. This followed by acquisition of 2, 18 gauge core biopsies. The samples were placed in saline and formalin. The needle was removed. Postprocedure ultrasound evaluation demonstrated no evidence of surrounding hematoma or other complicating features. A sterile bandage was applied. The patient tolerated the procedure well. IMPRESSION: Technically successful right supraclavicular mass/lymph node biopsy. Ester Sides, MD [facility] Electronically Signed   By: Ester Sides M.D.   On: 01/22/2024 15:14   DG Chest 1 View Result Date: 01/21/2024 CLINICAL DATA:  Short of breath EXAM: CHEST  1 VIEW COMPARISON:  Radiograph 01/20/2024, CT 01/19/2024 FINDINGS: Normal  cardiac silhouette. Persistent large RIGHT perihilar mass. Persistent widening of the upper mediastinum related lymphadenopathy. Moderate LEFT effusion slightly improved. IMPRESSION: 1. RIGHT perihilar mass concerning for malignancy. Mediastinal lymphadenopathy 2. Interval improvement in RIGHT pleural effusion compared to CT 01/19/2024 3. Persistent LEFT pleural effusion Electronically Signed   By: Jackquline Boxer M.D.   On: 01/21/2024 09:56        Scheduled Meds:  aspirin  EC  81 mg Oral Q breakfast   Chlorhexidine  Gluconate Cloth  6 each Topical Q0600   dextromethorphan -guaiFENesin   1 tablet Oral BID   dextrose   25 g Intravenous Once   gabapentin   600 mg Oral TID   heparin   5,000 Units Subcutaneous Q8H   insulin  aspart  0-15 Units Subcutaneous TID WC   insulin  aspart  0-5 Units Subcutaneous QHS   insulin  glargine-yfgn  10 Units Subcutaneous QHS   ipratropium-albuterol   3 mL Nebulization TID   methylPREDNISolone  (SOLU-MEDROL ) injection  40 mg Intravenous Daily   nicotine   21 mg Transdermal Daily   pantoprazole   40 mg Oral Daily   rosuvastatin   10 mg Oral Daily   sodium chloride  flush  3 mL Intravenous Q12H   sodium chloride  flush  3 mL Intravenous Q12H   Continuous Infusions:     LOS: 3 days    Time spent: 45 minutes    Toribio Hummer, MD [facility]   To contact the attending provider between 7A-7P or the covering provider during after hours 7P-7A, please log into the web site [email or web address] and access using universal [place] password for that web site. If you do not have the password, please call the hospital operator.  01/22/2024, 5:39 PM

## 2024-01-22 NOTE — Progress Notes (Signed)
 [facility] CONSULT NOTE  Patient Care Team: Pardue, Lauraine SAILOR, DO as PCP - General (Family Medicine) Darron Deatrice LABOR, MD as PCP - Cardiology (Cardiology) Melanee Annah BROCKS, MD as Consulting Physician (Oncology) Pa, [facility] (Optometry)  CHIEF COMPLAINTS/PURPOSE OF CONSULTATION:  Metastatic carcinoma  HISTORY OF PRESENTING ILLNESS:  Mr. Oscar Mills is a 73 year old who presented to the emergency department on 01/09/2024 with complaints of shortness of breath.  He had perihilar lymphadenopathy on chest x-ray.  He presented to the emergency department on 01/19/2024 with respiratory distress and hypoxia and CT scans were performed that revealed bulky supraclavicular, mediastinal right hilar lymphadenopathy with SVC syndrome along with right renal mass.  CT head was negative.  He also had significant bilateral pleural effusions and pericardial effusion. He underwent thoracentesis 2 days ago and a lymph node biopsy yesterday.   I reviewed her records extensively and collaborated the history with the patient.  MEDICAL HISTORY:  Past Medical History:  Diagnosis Date   Arthritis    NECK   Diabetes mellitus, type II (HCC)    Dysfunctional alcohol  use    now quit   GERD (gastroesophageal reflux disease)    Hypertension    Neuropathy    feet   Pancreatitis    Peripheral vascular disease (HCC)    Stress fracture of ankle     SURGICAL HISTORY: Past Surgical History:  Procedure Laterality Date   ABDOMINAL AORTOGRAM W/LOWER EXTREMITY Bilateral 10/28/2019   Procedure: ABDOMINAL AORTOGRAM W/LOWER EXTREMITY;  Surgeon: Darron Deatrice LABOR, MD;  Location: [facility];  Service: Cardiovascular;  Laterality: Bilateral;   ABDOMINAL AORTOGRAM W/LOWER EXTREMITY Bilateral 06/29/2020   Procedure: ABDOMINAL AORTOGRAM W/LOWER EXTREMITY;  Surgeon: Darron Deatrice LABOR, MD;  Location: [facility];  Service: Cardiovascular;  Laterality: Bilateral;   CATARACT EXTRACTION W/PHACO Left  09/03/2019   Procedure: CATARACT EXTRACTION PHACO AND INTRAOCULAR LENS PLACEMENT (IOC) LEFT VISION BLUE 9.84 00:54.9 17.9%;  Surgeon: Ferol Rogue, MD;  Location: [facility];  Service: Ophthalmology;  Laterality: Left;  Diabetic - diet controlled   CATARACT EXTRACTION W/PHACO Right 04/18/2023   Procedure: CATARACT EXTRACTION PHACO AND INTRAOCULAR LENS PLACEMENT (IOC) RIGHT DIABETIC  RAYNER LENS TRIAL 11.80 01:50.0;  Surgeon: Enola Feliciano Hugger, MD;  Location: [facility];  Service: Ophthalmology;  Laterality: Right;   LOWER EXTREMITY ANGIOGRAM Bilateral 02/24/2014   Procedure: LOWER EXTREMITY ANGIOGRAM;  Surgeon: Deatrice LABOR Darron, MD;  Location: [facility] CATH [facility];  Service: Cardiovascular;  Laterality: Bilateral;   PERIPHERAL VASCULAR CATHETERIZATION     PERIPHERAL VASCULAR INTERVENTION  10/28/2019   Procedure: PERIPHERAL VASCULAR INTERVENTION;  Surgeon: Darron Deatrice LABOR, MD;  Location: [facility];  Service: Cardiovascular;;   PERIPHERAL VASCULAR INTERVENTION  06/29/2020   Procedure: PERIPHERAL VASCULAR INTERVENTION;  Surgeon: Darron Deatrice LABOR, MD;  Location: [facility];  Service: Cardiovascular;;   UMBILICAL HERNIA REPAIR N/A 03/05/2017   Procedure: HERNIA REPAIR UMBILICAL ADULT;  Surgeon: Claudene Larinda Bolder, MD;  Location: [facility] OR[facility];  Service: General;  Laterality: N/A;    SOCIAL HISTORY: Social History   Socioeconomic History   Marital status: Married    Spouse name: Not on file   Number of children: 2   Years of education: 12   Highest education level: 11th grade  Occupational History   Occupation: self-employed  Tobacco Use   Smoking status: Every Day    Current packs/day: 0.50    Average packs/day: 0.5 packs/day for 40.0 years (20.0 ttl pk-yrs)  Types: Cigarettes   Smokeless tobacco: Never  Vaping Use   Vaping status: Never Used  Substance and Sexual Activity   Alcohol  use: Not Currently    Comment: quit around 2012   Drug use: No   Sexual  activity: Not on file  Other Topics Concern   Not on file  Social History Narrative   Not on file   Social Drivers of Health   Financial Resource Strain: Low Risk  (03/13/2023)   Overall Financial Resource Strain (CARDIA)    Difficulty of Paying Living Expenses: Not very hard  Food Insecurity: Patient Unable To Answer (01/19/2024)   Hunger Vital Sign    Worried About Running Out of Food in the Last Year: Patient unable to answer    Ran Out of Food in the Last Year: Patient unable to answer  Transportation Needs: Patient Unable To Answer (01/19/2024)   PRAPARE - Transportation    Lack of Transportation (Medical): Patient unable to answer    Lack of Transportation (Non-Medical): Patient unable to answer  Physical Activity: Insufficiently Active (08/20/2023)   Exercise Vital Sign    Days of Exercise per Week: 1 day    Minutes of Exercise per Session: 70 min  Stress: No Stress Concern Present (08/20/2023)   [employer] of Occupational Health - Occupational Stress Questionnaire    Feeling of Stress : Not at all  Social Connections: Patient Unable To Answer (01/19/2024)   Social Connection and Isolation Panel    Frequency of Communication with Friends and Family: Patient unable to answer    Frequency of Social Gatherings with Friends and Family: Patient unable to answer    Attends Religious Services: Patient unable to answer    Active Member of Clubs or Organizations: Patient unable to answer    Attends Banker Meetings: Patient unable to answer    Marital Status: Patient unable to answer  Intimate Partner Violence: Patient Unable To Answer (01/19/2024)   Humiliation, Afraid, Rape, and Kick questionnaire    Fear of Current or Ex-Partner: Patient unable to answer    Emotionally Abused: Patient unable to answer    Physically Abused: Patient unable to answer    Sexually Abused: Patient unable to answer    FAMILY HISTORY: Family History  Problem Relation Age of Onset    Heart attack Father     ALLERGIES:  is allergic to iohexol .  MEDICATIONS:  Current Facility-Administered Medications  Medication Dose Route Frequency Provider Last Rate Last Admin   acetaminophen  (TYLENOL ) tablet 650 mg  650 mg Oral Q6H PRN Emokpae, Courage, MD       Or   acetaminophen  (TYLENOL ) suppository 650 mg  650 mg Rectal Q6H PRN Emokpae, Courage, MD       albuterol  (PROVENTIL ) (2.5 MG/3ML) 0.083% nebulizer solution 2.5 mg  2.5 mg Nebulization Q2H PRN Emokpae, Courage, MD       aspirin  EC tablet 81 mg  81 mg Oral Q breakfast Emokpae, Courage, MD   81 mg at 01/22/24 0831   bisacodyl  (DULCOLAX) suppository 10 mg  10 mg Rectal Daily PRN Emokpae, Courage, MD       Chlorhexidine  Gluconate Cloth 2 % PADS 6 each  6 each Topical Q0600 Emokpae, Courage, MD   6 each at 01/21/24 1754   dextromethorphan -guaiFENesin  (MUCINEX  DM) 30-600 MG per 12 hr tablet 1 tablet  1 tablet Oral BID Emokpae, Courage, MD   1 tablet at 01/22/24 1014   dextrose  50 % solution 25 g  25 g  Intravenous Once Cindy Garnette POUR, MD       gabapentin  (NEURONTIN ) capsule 600 mg  600 mg Oral TID Arshad, Mohsin A, MD   600 mg at 01/22/24 1014   heparin  injection 5,000 Units  5,000 Units Subcutaneous Q8H Emokpae, Courage, MD   5,000 Units at 01/22/24 1405   insulin  aspart (novoLOG ) injection 0-15 Units  0-15 Units Subcutaneous TID WC Pearlean Manus, MD   8 Units at 01/22/24 1222   insulin  aspart (novoLOG ) injection 0-5 Units  0-5 Units Subcutaneous QHS Pearlean Manus, MD   2 Units at 01/21/24 2300   insulin  glargine-yfgn (SEMGLEE ) injection 10 Units  10 Units Subcutaneous QHS Cindy Garnette POUR, MD   10 Units at 01/21/24 2300   ipratropium-albuterol  (DUONEB) 0.5-2.5 (3) MG/3ML nebulizer solution 3 mL  3 mL Nebulization TID Pearlean Manus, MD   3 mL at 01/22/24 1421   lactated ringers  infusion   Intravenous Continuous Cindy Garnette POUR, MD 75 mL/hr at 01/22/24 0601 Infusion Verify at 01/22/24 9398   methylPREDNISolone  sodium  succinate (SOLU-MEDROL ) 40 mg/mL injection 40 mg  40 mg Intravenous Daily Desai, Nikita S, MD   40 mg at 01/22/24 1014   nicotine  (NICODERM CQ  - dosed in mg/24 hours) patch 21 mg  21 mg Transdermal Daily Emokpae, Courage, MD   21 mg at 01/22/24 1016   ondansetron  (ZOFRAN ) tablet 4 mg  4 mg Oral Q6H PRN Pearlean Manus, MD       Or   ondansetron  (ZOFRAN ) injection 4 mg  4 mg Intravenous Q6H PRN Pearlean Manus, MD       Oral care mouth rinse  15 mL Mouth Rinse 4 times per day Pearlean Manus, MD   15 mL at 01/21/24 2200   Oral care mouth rinse  15 mL Mouth Rinse PRN Emokpae, Courage, MD       pantoprazole  (PROTONIX ) EC tablet 40 mg  40 mg Oral Daily Emokpae, Courage, MD   40 mg at 01/22/24 1013   polyethylene glycol (MIRALAX  / GLYCOLAX ) packet 17 g  17 g Oral Daily PRN Pearlean Manus, MD       rosuvastatin  (CRESTOR ) tablet 10 mg  10 mg Oral Daily Emokpae, Courage, MD   10 mg at 01/22/24 1013   sodium chloride  flush (NS) 0.9 % injection 3 mL  3 mL Intravenous Q12H Emokpae, Courage, MD   3 mL at 01/21/24 2200   sodium chloride  flush (NS) 0.9 % injection 3 mL  3 mL Intravenous Q12H Emokpae, Courage, MD   3 mL at 01/22/24 1405   sodium chloride  flush (NS) 0.9 % injection 3 mL  3 mL Intravenous PRN Emokpae, Courage, MD       traZODone  (DESYREL ) tablet 50 mg  50 mg Oral QHS PRN Emokpae, Courage, MD        REVIEW OF SYSTEMS:   Constitutional: Denies fevers, chills or abnormal night sweats Lungs: Diminished breath sounds bilaterally Neck: Swollen with palpable lymphadenopathy  PHYSICAL EXAMINATION: ECOG PERFORMANCE STATUS: 4 - Bedbound  Vitals:   01/22/24 1100 01/22/24 1422  BP: 131/63   Pulse: 76   Resp: 20   Temp:    SpO2: 93% 96%   Filed Weights   01/19/24 0800  Weight: 134 lb 14.7 oz (61.2 kg)    GENERAL:alert, no distress and comfortable Marked lymphadenopathy in the neck  LABORATORY DATA:  I have reviewed the data as listed Lab Results  Component Value Date   WBC 14.1  (H) 01/22/2024   HGB 11.1 (  L) 01/22/2024   HCT 35.4 (L) 01/22/2024   MCV 98.6 01/22/2024   PLT 243 01/22/2024   Lab Results  Component Value Date   NA 140 01/22/2024   K 4.2 01/22/2024   CL 111 01/22/2024   CO2 21 (L) 01/22/2024    RADIOGRAPHIC STUDIES: I have personally reviewed the radiological reports and agreed with the findings in the report.  ASSESSMENT AND PLAN:  Metastatic lymphadenopathy: With pleural and pericardial effusions.  Pathology is reviewing his reports.  We may have a confirmative diagnosis tomorrow.  Initial suspicion is that this could be a small cell lung cancer. Discussion: I discussed with the family about what their plans are with regards to chemotherapy.  I informed them that without chemotherapy he would have a few days to weeks to survive.  With chemotherapy he may have several months and up to a year assuming he will respond to treatment and can tolerate it. Renal mass: Unclear etiology If patient decides not to receive chemotherapy then hospice will be an option.  His wife reports that he would prefer home hospice. However, patient has not made up his decision and will inform me tomorrow.  Hopefully tomorrow we will have more definitive pathology diagnosis.   All questions were answered. The patient knows to call the clinic with any problems, questions or concerns.    Viinay K Welton Bord, MD @T @

## 2024-01-22 NOTE — Inpatient Diabetes Management (Signed)
 Inpatient Diabetes Program Recommendations  AACE/ADA: New Consensus Statement on Inpatient Glycemic Control (2015)  Target Ranges:  Prepandial:   less than 140 mg/dL      Peak postprandial:   less than 180 mg/dL (1-2 hours)      Critically ill patients:  140 - 180 mg/dL   Lab Results  Component Value Date   GLUCAP 238 (H) 01/22/2024   HGBA1C 10.4 (H) 01/19/2024    Review of Glycemic Control  Diabetes history: DM2 Outpatient Diabetes medications: Lantus  30 units QHS Current orders for Inpatient glycemic control: Semglee  10 at bedtime, Novolog  0-15 TID and 0-5 HS On Solumedrol 40 mg QAM  HgbA1C - 10.4%  Inpatient Diabetes Program Recommendations:    Increase Semglee  to 15 units at bedtime  If post-prandials > 180 mg/dL, consider adding Novolog  3 units TID with meals if eating > 50%  Spoke with pt, wife and sister at bedside late yesterday afternoon. Wife gives pt his lantus  everyday. Uses glucose meter for monitoring, as pt did not like CGM. May need rapid-acting insulin  at home to achieve better glycemic control. Discussed HgbA1C of 10.4% and importance of reducing to < 8%. Discussed importance of checking CBGs and maintaining good CBG control to prevent long-term and short-term complications. Explained how hyperglycemia leads to damage within blood vessels which lead to the common complications seen with uncontrolled diabetes. Stressed to the patient the importance of improving glycemic control to prevent further complications from uncontrolled diabetes. Discussed impact of nutrition, exercise, stress, sickness, and medications on diabetes control.  Will continue to follow.   Thank you. Shona Brandy, RD, LDN, CDCES Inpatient Diabetes Coordinator [phone]

## 2024-01-23 ENCOUNTER — Ambulatory Visit: Admit: 2024-01-23 | Admitting: Radiation Oncology

## 2024-01-23 ENCOUNTER — Inpatient Hospital Stay (HOSPITAL_COMMUNITY)

## 2024-01-23 DIAGNOSIS — Z66 Do not resuscitate: Secondary | ICD-10-CM

## 2024-01-23 DIAGNOSIS — J9601 Acute respiratory failure with hypoxia: Secondary | ICD-10-CM | POA: Diagnosis not present

## 2024-01-23 DIAGNOSIS — J90 Pleural effusion, not elsewhere classified: Secondary | ICD-10-CM | POA: Diagnosis not present

## 2024-01-23 DIAGNOSIS — E1159 Type 2 diabetes mellitus with other circulatory complications: Secondary | ICD-10-CM

## 2024-01-23 DIAGNOSIS — R55 Syncope and collapse: Secondary | ICD-10-CM

## 2024-01-23 DIAGNOSIS — Z79899 Other long term (current) drug therapy: Secondary | ICD-10-CM

## 2024-01-23 DIAGNOSIS — C801 Malignant (primary) neoplasm, unspecified: Secondary | ICD-10-CM

## 2024-01-23 DIAGNOSIS — N179 Acute kidney failure, unspecified: Secondary | ICD-10-CM | POA: Diagnosis not present

## 2024-01-23 DIAGNOSIS — I152 Hypertension secondary to endocrine disorders: Secondary | ICD-10-CM

## 2024-01-23 DIAGNOSIS — J449 Chronic obstructive pulmonary disease, unspecified: Secondary | ICD-10-CM

## 2024-01-23 DIAGNOSIS — R0602 Shortness of breath: Secondary | ICD-10-CM

## 2024-01-23 DIAGNOSIS — Z7189 Other specified counseling: Secondary | ICD-10-CM | POA: Diagnosis not present

## 2024-01-23 DIAGNOSIS — I3139 Other pericardial effusion (noninflammatory): Secondary | ICD-10-CM | POA: Diagnosis not present

## 2024-01-23 DIAGNOSIS — Z515 Encounter for palliative care: Secondary | ICD-10-CM

## 2024-01-23 LAB — CBC WITH DIFFERENTIAL/PLATELET
Abs Immature Granulocytes: 0.05 K/uL (ref 0.00–0.07)
Basophils Absolute: 0 K/uL (ref 0.0–0.1)
Basophils Relative: 0 %
Eosinophils Absolute: 0 K/uL (ref 0.0–0.5)
Eosinophils Relative: 0 %
HCT: 36.8 % — ABNORMAL LOW (ref 39.0–52.0)
Hemoglobin: 11.3 g/dL — ABNORMAL LOW (ref 13.0–17.0)
Immature Granulocytes: 0 %
Lymphocytes Relative: 9 %
Lymphs Abs: 1.1 K/uL (ref 0.7–4.0)
MCH: 30.7 pg (ref 26.0–34.0)
MCHC: 30.7 g/dL (ref 30.0–36.0)
MCV: 100 fL (ref 80.0–100.0)
Monocytes Absolute: 1 K/uL (ref 0.1–1.0)
Monocytes Relative: 8 %
Neutro Abs: 10.4 K/uL — ABNORMAL HIGH (ref 1.7–7.7)
Neutrophils Relative %: 83 %
Platelets: 235 K/uL (ref 150–400)
RBC: 3.68 MIL/uL — ABNORMAL LOW (ref 4.22–5.81)
RDW: 15 % (ref 11.5–15.5)
WBC: 12.6 K/uL — ABNORMAL HIGH (ref 4.0–10.5)
nRBC: 0 % (ref 0.0–0.2)

## 2024-01-23 LAB — ECHOCARDIOGRAM LIMITED
Height: 74 in
S' Lateral: 2.6 cm
Weight: 2158.74 [oz_av]

## 2024-01-23 LAB — COMPREHENSIVE METABOLIC PANEL WITH GFR
ALT: 42 U/L (ref 0–44)
AST: 42 U/L — ABNORMAL HIGH (ref 15–41)
Albumin: 2.5 g/dL — ABNORMAL LOW (ref 3.5–5.0)
Alkaline Phosphatase: 91 U/L (ref 38–126)
Anion gap: 9 (ref 5–15)
BUN: 53 mg/dL — ABNORMAL HIGH (ref 8–23)
CO2: 23 mmol/L (ref 22–32)
Calcium: 8.5 mg/dL — ABNORMAL LOW (ref 8.9–10.3)
Chloride: 110 mmol/L (ref 98–111)
Creatinine, Ser: 1.83 mg/dL — ABNORMAL HIGH (ref 0.61–1.24)
GFR, Estimated: 39 mL/min — ABNORMAL LOW (ref >60)
Glucose, Bld: 211 mg/dL — ABNORMAL HIGH (ref 70–99)
Potassium: 4.4 mmol/L (ref 3.5–5.1)
Sodium: 142 mmol/L (ref 135–145)
Total Bilirubin: 0.6 mg/dL (ref 0.0–1.2)
Total Protein: 5.6 g/dL — ABNORMAL LOW (ref 6.5–8.1)

## 2024-01-23 LAB — GLUCOSE, CAPILLARY
Glucose-Capillary: 135 mg/dL — ABNORMAL HIGH (ref 70–99)
Glucose-Capillary: 128 mg/dL — ABNORMAL HIGH (ref 70–99)

## 2024-01-23 LAB — SURGICAL PATHOLOGY

## 2024-01-23 LAB — CYTOLOGY - NON PAP

## 2024-01-23 LAB — MAGNESIUM: Magnesium: 2.2 mg/dL (ref 1.7–2.4)

## 2024-01-23 MED ORDER — HALOPERIDOL 1 MG PO TABS
2.0000 mg | ORAL_TABLET | ORAL | Status: DC | PRN
Start: 2024-01-23 — End: 2024-01-23

## 2024-01-23 MED ORDER — BUDESONIDE 0.5 MG/2ML IN SUSP
0.5000 mg | Freq: Two times a day (BID) | RESPIRATORY_TRACT | Status: DC
  Administered 2024-01-23: 0.5 mg via RESPIRATORY_TRACT
  Filled 2024-01-23: qty 2

## 2024-01-23 MED ORDER — HYDROMORPHONE HCL 1 MG/ML IJ SOLN
1.0000 mg | INTRAMUSCULAR | Status: DC | PRN
  Administered 2024-01-23 (×2): 1 mg via SUBCUTANEOUS
  Filled 2024-01-23 (×2): qty 1

## 2024-01-23 MED ORDER — MORPHINE SULFATE (CONCENTRATE) 10 MG /0.5 ML PO SOLN
10.0000 mg | ORAL | Status: DC | PRN
  Administered 2024-01-23: 10 mg via ORAL
  Filled 2024-01-23: qty 0.5

## 2024-01-23 MED ORDER — HALOPERIDOL LACTATE 2 MG/ML PO CONC: 2.0000 mg | ORAL | Status: DC | PRN

## 2024-01-23 MED ORDER — BIOTENE DRY MOUTH MT LIQD: 15.0000 mL | OROMUCOSAL | Status: DC | PRN

## 2024-01-23 MED ORDER — POLYVINYL ALCOHOL 1.4 % OP SOLN: 1.0000 [drp] | Freq: Four times a day (QID) | OPHTHALMIC | Status: DC | PRN

## 2024-01-23 MED ORDER — HYDROMORPHONE HCL 1 MG/ML IJ SOLN
2.0000 mg | INTRAMUSCULAR | Status: DC | PRN
  Administered 2024-01-23: 2 mg via SUBCUTANEOUS

## 2024-01-23 MED ORDER — GLYCOPYRROLATE 1 MG PO TABS: 1.0000 mg | ORAL_TABLET | ORAL | Status: DC | PRN

## 2024-01-23 MED ORDER — ARFORMOTEROL TARTRATE 15 MCG/2ML IN NEBU
15.0000 ug | INHALATION_SOLUTION | Freq: Two times a day (BID) | RESPIRATORY_TRACT | Status: DC
  Administered 2024-01-23: 15 ug via RESPIRATORY_TRACT
  Filled 2024-01-23: qty 2

## 2024-01-23 MED ORDER — HALOPERIDOL LACTATE 5 MG/ML IJ SOLN: 2.0000 mg | INTRAMUSCULAR | Status: DC | PRN

## 2024-01-23 MED ORDER — DIAZEPAM 10 MG RE GEL: 2.5000 mg | RECTAL | Status: DC | PRN

## 2024-01-23 MED ORDER — SODIUM CHLORIDE 0.9 % IV SOLN
1.0000 mg/h | INTRAVENOUS | Status: DC
  Administered 2024-01-23: 0.5 mg/h via SUBCUTANEOUS
  Filled 2024-01-23: qty 20

## 2024-01-23 MED ORDER — GLYCOPYRROLATE 0.2 MG/ML IJ SOLN: 0.2000 mg | INTRAMUSCULAR | Status: DC | PRN

## 2024-01-23 MED ORDER — LORAZEPAM 2 MG/ML PO CONC
1.0000 mg | ORAL | Status: DC | PRN
  Filled 2024-01-23 (×2): qty 0.5

## 2024-01-24 LAB — BODY FLUID CULTURE W GRAM STAIN: Culture: NO GROWTH

## 2024-01-24 LAB — CEA: CEA: 2.9 ng/mL (ref 0.0–4.7)

## 2024-01-24 NOTE — Progress Notes (Signed)
 92 mL Dilaudid  gtt wasted with Odis PEAK.

## 2024-01-24 NOTE — Progress Notes (Signed)
*  PRELIMINARY RESULTS* Echocardiogram 2D Echocardiogram has been performed.  Benard FORBES Stallion 02/18/2024, 3:51 PM

## 2024-01-24 NOTE — Progress Notes (Addendum)
 Oscar Mills   DOB:06-25-1951   FM#:[id number]      ASSESSMENT & PLAN:  Oscar Mills 73 y.o. male who was admitted on 7/27 due to respiratory distress.  Workup was done in the ED including imaging which showed necrotic lymphadenopathy consistent with metastatic disease. Oncology/Dr. Kamera Dubas following.   Small cell carcinoma Extensive lymphadenopathy  - Imaging of neck done 01/19/2024 shows necrotic lymphadenopathy in the neck bilaterally consistent with metastatic disease.  There is also diffuse soft tissue swelling in the neck. - CT angio chest shows bulky supraclavicular, mediastinal, and right hilar adenopathy with SVC narrowing.  Metastatic disease or lymphoma primary considerations. - Mediastinal mass biopsied 01/20/2024, path shows Small Cell Carcinoma - Will need definitive path for further medical oncology treatment planning as appropriate. - Tumor marker CEA pending -- Seen by Radiation Oncology -- Discussed with patient on 7/30 that without chemotherapy he would have a few days to weeks to survive. With chemotherapy he may have several months and up to a year assuming he will respond to treatment and can tolerate it.  -- Patient and family decided today February 09, 2024 on DNR-comfort care.   - Medical oncology/Dr. Jaquelyn Sakamoto following closely    Acute respiratory distress COPD -Likely multifactorial secondary to lymphadenopathy and COPD - Status post right-sided thoracentesis with 2 L fluid removed - On Solu-Medrol  and antibiotics - Continue O2 therapy - Monitor respiratory status closely   Right renal mass - Per previous imaging - Mediastinal mass biopsied 7/28   Leukocytosis - WBC decreasing although slightly elevated 12.6 - On antibiotics  - Afebrile, monitor fever curve - Monitor CBC with differential   Anemia - Mild - Hemoglobin stable 11.3 - No transfusional intervention required   Renal insufficiency - Elevated creatinine and BUN - Avoid nephrotoxic agents -  Continue to monitor renal function  Code Status DNR-Comfort care  Subjective:  Patient seen today, sleeping, no response to verbal stimuli.  Very ill-appearing with labored breathing.  Wife states that he just received Dilaudid .  Discussed with wife and patient's sister, confirmed their decision for our comfort care.  States they have chosen hospice care at [facility].  Spend time with wife and sister, empathetically listened.     Objective:   Intake/Output Summary (Last 24 hours) at Feb 09, 2024 1118 Last data filed at Feb 09, 2024 0830 Gross per 24 hour  Intake 781.91 ml  Output 1325 ml  Net -543.09 ml     PHYSICAL EXAMINATION: ECOG PERFORMANCE STATUS: 4 - Bedbound  Vitals:   2024/02/09 0800 02-09-2024 0900  BP: (!) 148/53 (!) 117/49  Pulse: 70 72  Resp: (!) 24 (!) 22  Temp:    SpO2: 93% 96%   Filed Weights   01/19/24 0800  Weight: 134 lb 14.7 oz (61.2 kg)    GENERAL: +Lethargic + labored breathing + chronically ill-appearing SKIN: + Husky skin color, texture, turgor are normal, no rashes or significant lesions EYES: normal, conjunctiva are pink and non-injected, sclera clear OROPHARYNX: no exudate, no erythema and lips, buccal mucosa, and tongue normal  NECK: supple, thyroid  normal size, non-tender, without nodularity LYMPH: no palpable lymphadenopathy in the cervical, axillary or inguinal LUNGS: clear to auscultation and percussion with normal breathing effort HEART: regular rate & rhythm and no murmurs and no lower extremity edema ABDOMEN: abdomen soft, non-tender and normal bowel sounds MUSCULOSKELETAL: no cyanosis of digits and no clubbing  PSYCH: + Lethargic    All questions were answered. The patient knows to call the clinic with any problems,  questions or concerns.   The total time spent in the appointment was 40 minutes encounter with patient including review of chart and various tests results, discussions about plan of care and coordination of care plan  Olam JINNY Brunner, NP 02/01/2024 11:18 AM    Labs Reviewed:  Lab Results  Component Value Date   WBC 12.6 (H) 01-Feb-2024   HGB 11.3 (L) 2024-02-01   HCT 36.8 (L) 2024/02/01   MCV 100.0 02-01-24   PLT 235 02-01-24   Recent Labs    01/21/24 0332 01/22/24 0324 2024/02/01 0339  NA 141 140 142  K 4.0 4.2 4.4  CL 113* 111 110  CO2 17* 21* 23  GLUCOSE 94 239* 211*  BUN 51* 54* 53*  CREATININE 1.85* 2.02* 1.83*  CALCIUM  8.5* 8.5* 8.5*  GFRNONAA 38* 35* 39*  PROT 4.9* 5.1* 5.6*  ALBUMIN 2.4* 2.4* 2.5*  AST 45* 36 42*  ALT 30 30 42  ALKPHOS 95 85 91  BILITOT 0.6 0.6 0.6    Studies Reviewed:  US  RENAL Result Date: 01/22/2024 CLINICAL DATA:  [id number] ARF (acute renal failure) (HCC) [id number] EXAM: RENAL / URINARY TRACT ULTRASOUND COMPLETE COMPARISON:  April 03, 2023 FINDINGS: Right Kidney: Renal measurements: 12.6 x 9.7 x 4.3 cm = volume: 278 mL. Increased echogenicity. Small upper pole cyst measuring 1.7 cm. Multi septated cyst in the lower pole again noted, measuring 4.9 x 4.7 x 3.2 cm on today's exam. This is likely due to partial obscuration of the lower portion from overlying bowel gas. No hydronephrosis or nephrolithiasis. Left Kidney: Renal measurements: 12.7 x 5.6 x 6.3 cm = volume: 232 mL. Increased echogenicity. Lower pole cyst measuring 1.1 cm. No hydronephrosis or nephrolithiasis. Bladder: Circumferential wall thickening of the urinary bladder, which is decompressed, with a urinary catheter in place. Other: Bilateral pleural effusions. IMPRESSION: 1. No hydronephrosis or nephrolithiasis. Renal parenchymal changes, which can be seen in both acute and chronic medical renal disease. 2. Circumferential wall thickening of the urinary bladder, which may be due to underdistension or acute cystitis. Correlation with urinalysis recommended. 3. Redemonstrated, partially visualized multi septated cyst in the lower pole of the right kidney, measuring 4.9 x 4.3 x 3.2 cm. While this lesion measures  smaller on today's exam, this is likely due to partial obscuration from overlying bowel gas. Nonemergent urologic follow-up recommended for further management of this Bosniak III lesion. These results will be called to the ordering clinician or representative by the Radiologist Assistant and communication documented in the PACS or [employer]. Electronically Signed   By: Rogelia Myers M.D.   On: 01/22/2024 19:46   US  EKG SITE RITE Result Date: 01/22/2024 If Site Rite image not attached, placement could not be confirmed due to current cardiac rhythm.  US  CORE BIOPSY (LYMPH NODES) Result Date: 01/22/2024 INDICATION: 73 year old male with indeterminate cervical lymphadenopathy. EXAM: Ultrasound-guided right supraclavicular mass biopsy. MEDICATIONS: None. ANESTHESIA/SEDATION: None. FLUOROSCOPY TIME:  None. COMPLICATIONS: None immediate. PROCEDURE: Informed written consent was obtained from the patient after a thorough discussion of the procedural risks, benefits and alternatives. All questions were addressed. Maximal Sterile Barrier Technique was utilized including caps, mask, sterile gowns, sterile gloves, sterile drape, hand hygiene and skin antiseptic. A timeout was performed prior to the initiation of the procedure. Preprocedure ultrasound evaluation demonstrated multifocal heterogeneously hypoechoic masses in the right neck and supraclavicular region compatible with lymphadenopathy. The largest right supraclavicular mass was then targeted for biopsy. Subdermal Local anesthesia was administered at the planned needle entry  site with 1% lidocaine . A small skin nick was made. Under direct ultrasound visualization, a 17 gauge coaxial introducer needle was directed to the periphery of the mass. This followed by acquisition of 2, 18 gauge core biopsies. The samples were placed in saline and formalin. The needle was removed. Postprocedure ultrasound evaluation demonstrated no evidence of surrounding hematoma  or other complicating features. A sterile bandage was applied. The patient tolerated the procedure well. IMPRESSION: Technically successful right supraclavicular mass/lymph node biopsy. Ester Sides, MD [facility] Radiology Electronically Signed   By: Ester Sides M.D.   On: 01/22/2024 15:14   DG Chest 1 View Result Date: 01/21/2024 CLINICAL DATA:  Short of breath EXAM: CHEST  1 VIEW COMPARISON:  Radiograph 01/20/2024, CT 01/19/2024 FINDINGS: Normal cardiac silhouette. Persistent large RIGHT perihilar mass. Persistent widening of the upper mediastinum related lymphadenopathy. Moderate LEFT effusion slightly improved. IMPRESSION: 1. RIGHT perihilar mass concerning for malignancy. Mediastinal lymphadenopathy 2. Interval improvement in RIGHT pleural effusion compared to CT 01/19/2024 3. Persistent LEFT pleural effusion Electronically Signed   By: Jackquline Boxer M.D.   On: 01/21/2024 09:56   DG CHEST PORT 1 VIEW Result Date: 01/20/2024 CLINICAL DATA:  [id number] S/P right thoracentesis [id number] EXAM: PORTABLE CHEST - 1 VIEW COMPARISON:  January 19, 2023 FINDINGS: Near complete resolution of the right pleural effusion. Persistent moderate left pleural effusion with left basilar atelectasis. No pneumothorax. Mild cardiomegaly. Similar right hilar and paratracheal fullness corresponding to the lymphadenopathy. No acute fracture or destructive lesions. Multilevel thoracic osteophytosis. IMPRESSION: 1. Near complete resolution of the right pleural effusion. No pneumothorax. 2. Persistent moderate volume left pleural effusion. Electronically Signed   By: Rogelia Myers M.D.   On: 01/20/2024 15:48   ECHOCARDIOGRAM COMPLETE Result Date: 01/20/2024    ECHOCARDIOGRAM REPORT   Patient Name:   SHONDELL POULSON Date of Exam: 01/20/2024 Medical Rec #:  [id number]          Height:       74.0 in Accession #:    [id number]         Weight:       134.9 lb Date of Birth:  [birth year]           BSA:          1.839 m Patient Age:    73 years           BP:           140/51 mmHg Patient Gender: M                  HR:           70 bpm. Exam Location:  Inpatient Procedure: 2D Echo, Color Doppler and Cardiac Doppler (Both Spectral and Color            Flow Doppler were utilized during procedure). Indications:    Dyspnea R06.00  History:        Patient has no prior history of Echocardiogram examinations.                 Risk Factors:Hypertension and Diabetes.  Sonographer:    Tinnie Gosling RDCS Referring Phys: [id number] COURAGE EMOKPAE IMPRESSIONS  1. Left ventricular ejection fraction, by estimation, is 60 to 65%. The left ventricle has normal function. The left ventricle has no regional wall motion abnormalities. There is mild left ventricular hypertrophy. Left ventricular diastolic parameters are consistent with Grade I diastolic dysfunction (impaired relaxation).  2. Right ventricular systolic  function is mildly reduced. The right ventricular size is mildly enlarged. There is mildly elevated pulmonary artery systolic pressure. The estimated right ventricular systolic pressure is 40.0 mmHg.  3. Moderate pericardial effusion. The pericardial effusion is anterior to the right ventricle and surrounding the apex. BP 140/51 mmHg, HR 70 bpm at time of echo. IVC is dilated and noncollapsible. No RV diastolic collapse. Hepatic vein doppler challenging to interpret. No significant RA collapse. Mild respiratory related septal shift. No significant variation in mitral inflow. Finding in combination indicate no definite tamponade, but with dilated IVC may indicate early constrictive/tamponade physiology. Consider repeat limited echo in 2-3 days or with change in hemodynamics. Large pleural effusion in the left lateral region.  4. The mitral valve is grossly normal. Trivial mitral valve regurgitation. No evidence of mitral stenosis.  5. The aortic valve is tricuspid. Aortic valve regurgitation is not visualized. No  aortic stenosis is present.  6. The inferior vena cava is dilated in size with <50% respiratory variability, suggesting right atrial pressure of 15 mmHg. FINDINGS  Left Ventricle: Left ventricular ejection fraction, by estimation, is 60 to 65%. The left ventricle has normal function. The left ventricle has no regional wall motion abnormalities. The left ventricular internal cavity size was normal in size. There is  mild left ventricular hypertrophy. Left ventricular diastolic parameters are consistent with Grade I diastolic dysfunction (impaired relaxation). Right Ventricle: The right ventricular size is mildly enlarged. No increase in right ventricular wall thickness. Right ventricular systolic function is mildly reduced. There is mildly elevated pulmonary artery systolic pressure. The tricuspid regurgitant  velocity is 2.50 m/s, and with an assumed right atrial pressure of 15 mmHg, the estimated right ventricular systolic pressure is 40.0 mmHg. Left Atrium: Left atrial size was normal in size. Right Atrium: Right atrial size was normal in size. Pericardium: A moderately sized pericardial effusion is present. The pericardial effusion is anterior to the right ventricle and surrounding the apex. There is no evidence of cardiac tamponade. Presence of epicardial fat layer. Mitral Valve: The mitral valve is grossly normal. Trivial mitral valve regurgitation. No evidence of mitral valve stenosis. Tricuspid Valve: The tricuspid valve is normal in structure. Tricuspid valve regurgitation is trivial. No evidence of tricuspid stenosis. Aortic Valve: The aortic valve is tricuspid. Aortic valve regurgitation is not visualized. No aortic stenosis is present. Pulmonic Valve: The pulmonic valve was normal in structure. Pulmonic valve regurgitation is trivial. No evidence of pulmonic stenosis. Aorta: The aortic root is normal in size and structure. Venous: The inferior vena cava is dilated in size with less than 50% respiratory  variability, suggesting right atrial pressure of 15 mmHg. IAS/Shunts: The interatrial septum is aneurysmal. The interatrial septum appears to be lipomatous. The interatrial septum was not well visualized. Additional Comments: There is a large pleural effusion in the left lateral region.  LEFT VENTRICLE PLAX 2D LVIDd:         4.40 cm   Diastology LVIDs:         3.10 cm   LV e' medial:    5.00 cm/s LV PW:         1.00 cm   LV E/e' medial:  11.7 LV IVS:        1.00 cm   LV e' lateral:   8.05 cm/s LVOT diam:     2.00 cm   LV E/e' lateral: 7.3 LV SV:         68 LV SV Index:   37 LVOT Area:  3.14 cm  RIGHT VENTRICLE             IVC RV S prime:     21.20 cm/s  IVC diam: 2.50 cm TAPSE (M-mode): 1.3 cm LEFT ATRIUM           Index        RIGHT ATRIUM           Index LA diam:      2.80 cm 1.52 cm/m   RA Area:     11.20 cm LA Vol (A4C): 42.9 ml 23.33 ml/m  RA Volume:   20.30 ml  11.04 ml/m  AORTIC VALVE LVOT Vmax:   94.60 cm/s LVOT Vmean:  70.200 cm/s LVOT VTI:    0.217 m  AORTA Ao Root diam: 3.20 cm Ao Asc diam:  3.40 cm MITRAL VALVE               TRICUSPID VALVE MV Area (PHT): 2.82 cm    TR Peak grad:   25.0 mmHg MV E velocity: 58.70 cm/s  TR Vmax:        250.00 cm/s MV A velocity: 64.30 cm/s MV E/A ratio:  0.91        SHUNTS                            Systemic VTI:  0.22 m                            Systemic Diam: 2.00 cm Soyla Merck MD Electronically signed by Soyla Merck MD Signature Date/Time: 01/20/2024/10:21:17 AM    Final    CT Angio Chest PE W and/or Wo Contrast Result Date: 01/19/2024 CLINICAL DATA:  Pulmonary embolism (PE) suspected, low to intermediate prob, positive D-dimer EXAM: CT ANGIOGRAPHY CHEST WITH CONTRAST TECHNIQUE: Multidetector CT imaging of the chest was performed using the standard protocol during bolus administration of intravenous contrast. Multiplanar CT image reconstructions and MIPs were obtained to evaluate the vascular anatomy. RADIATION DOSE REDUCTION: This exam was  performed according to the departmental dose-optimization program which includes automated exposure control, adjustment of the mA and/or kV according to patient size and/or use of iterative reconstruction technique. CONTRAST:  60mL OMNIPAQUE  IOHEXOL  350 MG/ML SOLN COMPARISON:  CT abdomen 01/19/2022, chest 02/16/2006 FINDINGS: Cardiovascular: SVC narrowing by mediastinal mass, without significant collateral filling. Heart size normal. Moderate pericardial effusion. Satisfactory opacification of pulmonary arteries noted, and there is no evidence of pulmonary emboli. Mild left coronary calcifications. Adequate contrast opacification of the thoracic aorta with no evidence of dissection, aneurysm, or stenosis. There is classic 3-vessel brachiocephalic arch anatomy without proximal stenosis. Scattered calcified plaque in the arch and descending thoracic aorta. Mediastinum/Nodes: Bulky bilateral supraclavicular adenopathy. Confluent anterior mediastinal and right paratracheal mass. Subcarinal and pre-vascular adenopathy. Confluent right hilar mass or adenopathy, encasing branches of the right upper lobe pulmonary artery. Mild left axillary adenopathy. Lungs/Pleura: Moderate bilateral pleural effusions. Atelectasis of most of the left lobe, and dependent aspects of lingula in right lower lobe. Scattered bilateral pulmonary nodules, largest on the left none mm subpleural in the upper lobe (7:91) , on the right in the superior segment lower lobe 7 mm (110:7) . no pneumothorax. Upper Abdomen: Left para-aortic adenopathy up to 1.7 cm. Extensive coarse calcifications in the pancreatic body and tail suggesting chronic pancreatitis. 7.4 cm complex cystic mass at the right renal hilum, previously 6 cm. Musculoskeletal: Bilateral shoulder DJD.  Vertebral endplate spurring at multiple levels in the mid thoracic spine. Review of the MIP images confirms the above findings. IMPRESSION: 1. Negative for acute PE or thoracic aortic  dissection. 2. Bulky supraclavicular, mediastinal, and right hilar adenopathy, with SVC narrowing. Metastatic disease or lymphoma primary considerations. Follow-up recommended. 3. New moderate bilateral pleural effusions with left greater than right atelectasis. 4. Increased moderate pericardial effusion. 5. 7.4 cm complex cystic mass at the right renal hilum, previously 6 cm. 6.  Aortic Atherosclerosis (ICD10-I70.0). Electronically Signed   By: JONETTA Faes M.D.   On: 01/19/2024 13:41   CT Soft Tissue Neck W Contrast Result Date: 01/19/2024 CLINICAL DATA:  Left neck soft tissue mass. Recent antibiotic treatment for tonsillitis. Difficulty breathing. Hypoxia. EXAM: CT NECK WITH CONTRAST TECHNIQUE: Multidetector CT imaging of the neck was performed using the standard protocol following the bolus administration of intravenous contrast. RADIATION DOSE REDUCTION: This exam was performed according to the departmental dose-optimization program which includes automated exposure control, adjustment of the mA and/or kV according to patient size and/or use of iterative reconstruction technique. CONTRAST:  60mL OMNIPAQUE  IOHEXOL  350 MG/ML SOLN COMPARISON:  None Available. FINDINGS: Pharynx and larynx: Mildly limited assessment due to mild motion artifact. Symmetric tonsils without substantial enlargement. Normal epiglottis. Patent airway. Small amount of low-density, mildly nodular material posterolateral E on the right in the subglottic larynx and proximal trachea (series 3, images 88 and 93) individually measuring up to 1.5 cm in craniocaudal dimension. Mild retropharyngeal edema or minimal fluid without organized retropharyngeal or peritonsillar fluid collection. Salivary glands: No inflammation, mass, or stone. Thyroid : Unremarkable. Lymph nodes: Necrotic lymphadenopathy in the neck bilaterally including conglomerate bilateral level IV/supraclavicular nodal masses measuring 5.0 x 4.3 x 6.5 cm on the right and 4.6 x 3.6 x  6.6 cm on the left. Additional smaller pathologic lymph nodes more superiorly in the neck bilaterally including a 1.7 cm short axis necrotic left submandibular node. Vascular: The internal jugular veins are compressed in the lower neck by the nodal masses but are patent more superiorly in the neck. Calcified atherosclerosis in the carotid bulbs with at least a moderate stenosis of the proximal right ICA. Limited intracranial: Unremarkable. Visualized orbits: Bilateral cataract extraction. Mastoids and visualized paranasal sinuses: Minimal mucosal thickening in the paranasal sinuses. Clear mastoid air cells. Skeleton: Moderate cervical spondylosis.  Dental caries. Upper chest: More fully evaluated on the separately reported contemporaneous CT of the chest. Other: Relatively widespread soft tissue swelling in the left greater than right neck, nonspecific but may be secondary to venous obstruction in the superior mediastinum. IMPRESSION: 1. Necrotic lymphadenopathy in the neck bilaterally consistent with metastatic disease. See the separately reported chest CT for thoracic findings. 2. Small amount of low-density material in the subglottic larynx and proximal trachea, indeterminate for adherent mucus or mass. Correlate with direct visualization. 3. Diffuse soft tissue swelling in the neck which may be secondary to venous obstruction. Electronically Signed   By: Dasie Hamburg M.D.   On: 01/19/2024 13:31   CT Head Wo Contrast Result Date: 01/19/2024 CLINICAL DATA:  Altered mental status.  Ataxia. EXAM: CT HEAD WITHOUT CONTRAST TECHNIQUE: Contiguous axial images were obtained from the base of the skull through the vertex without intravenous contrast. RADIATION DOSE REDUCTION: This exam was performed according to the departmental dose-optimization program which includes automated exposure control, adjustment of the mA and/or kV according to patient size and/or use of iterative reconstruction technique. COMPARISON:  None  Available. FINDINGS: Brain: There is no evidence  of an acute infarct, intracranial hemorrhage, mass, midline shift, or extra-axial fluid collection. There is mild cerebral atrophy. Periventricular white matter hypodensities are nonspecific but compatible with mild chronic small vessel ischemic disease. Vascular: Calcified atherosclerosis at the skull base. No hyperdense vessel. Skull: No acute fracture or suspicious lesion. Sinuses/Orbits: Minimal mucosal thickening in the paranasal sinuses. Clear mastoid air cells. Bilateral cataract extraction. Other: None. IMPRESSION: 1. No evidence of acute intracranial abnormality. 2. Mild chronic small vessel ischemic disease. Electronically Signed   By: Dasie Hamburg M.D.   On: 01/19/2024 13:15   DG Chest Port 1 View Result Date: 01/19/2024 CLINICAL DATA:  Chest pain EXAM: PORTABLE CHEST 1 VIEW COMPARISON:  01/09/2024 FINDINGS: Bibasilar collapse/consolidation with moderate left and small right pleural effusions. Interstitial markings are diffusely coarsened with chronic features. Persistent right paratracheal and right mediastinal/hilar fullness. No acute bony abnormality. Telemetry leads overlie the chest. IMPRESSION: 1. Bibasilar collapse/consolidation with moderate left and small right pleural effusions, progressive in the interval since prior study. 2. Persistent right paratracheal and right mediastinal/hilar fullness. CT chest with contrast recommended to further evaluate. Electronically Signed   By: Camellia Candle M.D.   On: 01/19/2024 07:59   DG Chest 2 View Result Date: 01/09/2024 CLINICAL DATA:  Shortness of breath EXAM: CHEST - 2 VIEW COMPARISON:  02/22/2006 FINDINGS: Cardiac shadow is within normal limits. Aortic calcifications are noted. Bilateral pleural effusions are seen left greater than right. Fullness is noted in the right perihilar region. This may represent underlying adenopathy and or mass. CT of the chest with contrast is recommended for further  evaluation. Possible nodular density in the right base is noted. No acute bony abnormality is noted. IMPRESSION: Fullness in the right perihilar region and right paratracheal region. Changes are suspicious for underlying lymphadenopathy/mass. CT with contrast is recommended for further evaluation. Bilateral pleural effusions left greater than right. Electronically Signed   By: Oneil Devonshire M.D.   On: 01/09/2024 03:54     Attending Note  I personally saw the patient, reviewed the chart and examined the patient. The plan of care was discussed with the patient and the admitting team. I agree with the assessment and plan as documented above.  Metastatic small cell lung cancer: Patient and family decided not to pursue any additional treatment.  Hospice has been consulted. DNR CC Thank you very much for involving us  in his care.  Please do not hesitate to call us  if there are any questions or concerns.

## 2024-01-24 NOTE — Consult Note (Signed)
 NAME:  Oscar Mills, MRN:  [id number], DOB:  [birth year], LOS: 4 ADMISSION DATE:  01/19/2024 CONSULTATION DATE:  01/20/2024 REFERRING MD:  Redia - TRH, CHIEF COMPLAINT:  SOB, LAD, ?cardiac tamponade, ?SVC syndrome   History of Present Illness:  73 year old man who presented to [facility] 7/27 as a transfer from [facility] for SOB, hypoxia. PMHx significant for HTN, PVD, COPD, T2DM, pancreatitis, EtOH use (now abstinent), tobacco use, OA. Recently diagnosed with tonsillitis and placed on Augmentin  course, presented to [facility] ED 7/17 with SOB while laying flat. CXR at that time demonstrated new perihilar infiltrates (mass vs. LAD). CT Chest was recommended by EDP; however family refused with plan to follow up with PCP as outpatient.  Patient presented to [facility] 7/27 for SOB/difficulty breathing, neck swelling and LE edema; patient wife noted recent Augmentin  course for tonsillitis and was initially concerned for drug reaction. Patient was reportedly hypoxic to SpO2 81% on RA, placed on 4LNC, then [facility] with improvement. On ED arrival, patient was afebrile wit hHR 72, RR 21, BP 117/56, SpO2 97%. Labs were notable for WBC 10.1, Hgb 12.4, Plt 325. Na 139, K 4.3, CO2 21, BUN/Cr 40/1.77, glucose 264. LFTs WNL. BNP 100, Trop 6 > 6. COVID/Flu/RSV negative. VBG 7.26/53/23.7. Placed on BiPAP. CXR showed bibasilar collapse/consolidation with moderate L and small R pleural effusions with persistent R paratracheal/R mediastinal/hilar fullness. CT Head NAICA. CT Neck showed necrotic LAD bilaterally consistent with metastatic disease with small amount of low-density material in larynx c/f adherent mucus or mass and diffuse soft tissue swelling possibly 2/2 venous obstruction. CTA Chest was negative for PE/dissection, +bulky supraclavicular/mediastinal/R hilar adenopathy with SVC narrowing and new moderate bilateral pleural effusions, increased moderate pericardial effusion and 7.4cm complex cystic mass at R renal hilum. Admitted to [facility]  service. Family requested transfer to [facility], but agreed to [facility] admission after discussion with [facility] Oncology service. Patient was subsequently transferred to [facility].  On 7/28, PCCM consulted for ICU/pulmonary evaluation and management.  Pertinent Medical History:   Past Medical History:  Diagnosis Date   Arthritis    NECK   Diabetes mellitus, type II (HCC)    Dysfunctional alcohol  use    now quit   GERD (gastroesophageal reflux disease)    Hypertension    Neuropathy    feet   Pancreatitis    Peripheral vascular disease (HCC)    Stress fracture of ankle    Significant Hospital Events: Including procedures, antibiotic start and stop dates in addition to other pertinent events   7/27 - Presented to [facility] with SOB/dyspnea, facial/LE edema. Hypoxic to 81% on RA. CXR with concern for new mass due to hilar fullness. CT Head NAICA. CT Neck with bilateral LN necrosis c/f metastasis. CT Chest negative for PE but +bulky supraclavicular/mediastinal/R hilar adenopathy with SVC narrowing and new moderate bilateral pleural effusions, increased moderate pericardial effusion and 7.4cm complex cystic mass at R renal hilum. 7/28 - Transferred to [facility]. PCCM consulted.  Interim History / Subjective:  Called back for worsening respiratory distress, need for IV access.  Pleural fluid positive for small cell lung cancer.  Family at bedside. Patient does not want to do chemotherapy, radiation or have any kind of stents placed.   Objective:  Blood pressure (!) 117/49, pulse 72, temperature (!) 96.9 F (36.1 C), temperature source Axillary, resp. rate (!) 22, height 6' 2 (1.88 m), weight 61.2 kg, SpO2 96%.    FiO2 (%):  [75 %-100 %] 75 % PEEP:  [8 cmH20] 8 cmH20  Intake/Output Summary (Last 24 hours) at 07-Feb-2024 1004 Last data filed at February 07, 2024 0830 Gross per 24 hour  Intake 781.91 ml  Output 1325 ml  Net -543.09 ml   Filed Weights   01/19/24 0800  Weight: 61.2 kg   Physical Examination: Acutely  and chronically ill appearing man In respiratory distress on HFNC Awake, alert, able to respond to questions and follows commands  Breath sounds diminished bilaterally, no wheeze Upper extremity and facial swelling c/w SVC syndrome Lower extremities thin, decreased muscle bulk and tone Abdomen soft   Resolved Hospital Problem List:    Assessment & Plan:   SVC syndrome 2/2 bulky supraclavicular/mediastinal/R hilar adenopathy Newly diagnosed stage IV Small cell lung cancer, personally reviewed cytology results from pleural fluid Goals of care Discussed goals of care with patient and wife at bedside. Does not desire aggressive interventions such as chemotherapy, radiation. Reviewed that he would not want CPR or chest compressions either.  - no IV access. Will defer femoral line placement. Will start prn oral morphine  solution. Anticipate transition to inpatient comfort care - awaiting palliative care consultation   Acute hypoxemic respiratory failure likely 2/2 above Moderate bilateral pleural effusions COPD and Tobacco use - Supplemental O2 support for  O2 sat > 90% - Bronchodilators as ordered - Continue steroids - S/p R thoracentesis 7/28, f/u pleural fluid studies/cyto  Moderate pericardial effusion HTN PVD Echo 7/28 with EF 60-65%, no RWMAs, mild LVH, G1DD, mildly reduced RV function, moderate pericardial effusion. - Appreciate Cardiology input - Cardiac monitoring   I spent 50 minutes in total visit time for this patient, with more than 50% spent counseling/coordinating care.  Verdon Gore, MD Pulmonary and Critical Care Medicine [facility] 2024-02-07 10:07 AM Pager: see AMION  If no response to pager, please call critical care on call (see AMION) until 7pm After 7:00 pm call Elink

## 2024-01-24 NOTE — Progress Notes (Signed)
 PROGRESS NOTE    Oscar Mills  FMW:[id number] DOB: 1950-07-09 DOA: 01/19/2024 PCP: Donzella Lauraine SAILOR, DO    Chief Complaint  Patient presents with   Respiratory Distress    Wife states rx ammoxicillin 2 weeks ago for tonsillitis, took 7 of 10 days abx, went to [facility] for diff breathing approx 1 week ago, wife suspects abx reaction    Brief Narrative:  73 year old male, history of tobacco abuse, alcohol  use, COPD, HTN, DM 2, peripheral arterial disease, right renal mass, presented to [facility] for symptoms of shortness of breath, respiratory distress, was hypoxic.  Was placed on BiPAP in the ED.  Was previously seen at [facility] ED on 01/09/2024, was prescribed Augmentin  and inhalers, however symptoms worsened.  He he was seen by oncologist (Dr. Annah Skene) on 03/21/2021 for weight loss.  He also had a CT scan of the abdomen and pelvis in 7/23 that showed cystic subcentimeter right renal lesion concerning for renal cell carcinoma.  Renal mass protocol CT or MRI was recommended however patient did not follow through. CT scan of the head without any acute findings,  CT scan neck soft tissue shows necrotic lymphadenopathy in the neck bilaterally consistent with metastatic disease.  Small amount of low-density material in the subglottic larynx and proximal trachea, indeterminate for mucosal mass.  Diffuse soft tissue swelling in the neck may be due to venous obstruction. CTA chest negative for PE or thoracic dissection.  Bulky supraclavicular, mediastinal and right hilar adenopathy.  SVC narrowing.  Metastatic disease or lymphoma primary considerations.  Moderate bilateral pleural effusions.  Moderate pericardial effusion.  7.4 cm complex cystic mass of the right renal hilum, previously 6 cm.   Assessment & Plan:   Principal Problem:   Acute respiratory failure with hypoxia (HCC) Active Problems:   Lymphadenopathy   Small cell carcinoma (HCC)   PAD (peripheral artery disease) (HCC)    Hypertension associated with diabetes (HCC)   Uncontrolled diabetes mellitus with hyperglycemia (HCC)   Acute hypoxic respiratory failure (HCC)   Pleural effusion   Pericardial effusion   Lymphadenopathy, mediastinal   SVC syndrome   AKI (acute kidney injury) (HCC)   COPD with acute exacerbation (HCC)  #1 acute hypoxemic respiratory failure - Likely multifactorial secondary to extensive adenopathy with concern for metastatic disease versus lymphoma as well as pleural effusion noted in the setting of COPD.  Supraclavicular biopsies findings consistent with small cell carcinoma, cytology from pleural fluid consistent with small cell carcinoma. - Patient noted initially on NIV initially subsequently transition to nonrebreather and currently on 15 L high flow nasal cannula. - Patient status post thoracentesis on the right side with removal of 2 L of fluid. - Currently on 15 L high flow nasal cannula with some use of accessory muscles of respiration. - Continue Mucinex , scheduled DuoNebs, IV Solu-Medrol . -Place on Brovana  and Pulmicort . -Radiation oncology following and patient for possible simulation and treatment as early as today 2024-02-21. - PCCM was following but have signed off as of 01/22/2024. - Will reengage PCCM due to need for access in respiratory status  2.  Extensive mediastinal/right hilar and right supraclavicular lymphadenopathy/concern for SVC syndrome/small cell carcinoma - Patient was seen by PCCM who consulted radiation oncology and plan was for radiation therapy however initially patient declined. - Patient also seen in consultation by IR and underwent ultrasound-guided right supraclavicular mass biopsy on 01/21/2024 with findings consistent with small cell carcinoma.  -Cytology from pleural fluid consistent with small cell carcinoma. -  Patient assessed for SVC stent however per IR risks outweigh benefits at this time and if symptoms were to worsen then reengage IR. - Patient  reassessed by radiation oncology who discussed with family natural history of SVC syndrome, general treatment, role of radiotherapy and management and family have agreed that they would like to receive simulation and radiation treatment with eventual plans for transfer of his radiation treatment to [place] once clinically stable for transfer and are awaiting diagnosis of malignancy prior to starting treatment. - Patient seen in consultation by hematology/oncology who discussed with family about possibility of small cell lung cancer and discussed what their plans are in regards to chemotherapy.  Per oncology with chemotherapy patient may have several months to a year without chemotherapy may have few days to weeks to survive.   - Family seems might be leaning away from chemotherapy however hematology/oncology to discuss with family again today. - If family does not want any chemotherapy we will consult with palliative care for goals of care. - Radiation oncology following and appreciate the input and recommendations.  3.  Right renal cell mass -?  Metastatic malignancy versus other etiologies. - Patient status post ultrasound-guided supraclavicular mass biopsy with findings of small cell carcinoma.   4.  AKI -??  Etiology -Noted to have received CT angiogram chest with contrast. - Urinalysis with small hemoglobin, small leukocytes, specific gravity 1.014, protein negative.   - Urine sodium 115, urine creatinine of 73.  - Urine output of 1.125 L over the past 24 hours - Renal ultrasound negative for hydronephrosis or nephrolithiasis.  Renal parenchyma changes consistent with both acute and chronic medical renal disease.  Circumferential wall thickening of the urinary bladder.  Multiseptated cyst in the lower pole of the right kidney noted.   - Patient placed on IV fluids however lost access.   -Renal function trending back down.  5.  Acute COPD exacerbation -Patient on 15 L high flow nasal  cannula. - Continue IV Solu-Medrol , scheduled DuoNebs, Mucinex , PPI. -Will add Brovana  and Pulmicort . - Follow.  6.  PAD - Continue aspirin , Crestor .   - Plavix  on hold.   7.  Diabetes mellitus type 2./Diabetic neuropathy -Hemoglobin A1c 10.4. - Patient with CBG of 135 this morning. - Patient noted to be on Lantus  30 units daily as well as SSI. - Continue Semglee  to 15 units daily. -Continue NovoLog  3 units 3 times daily with meals. - SSI. -Continue gabapentin . - Diabetes coordinator following.  8.  Pericardial effusion - 2D echo done with moderate pericardial effusion but no definite cardiac tamponade noted. - Recommendations are for repeat limited 2D echo in 2 to 3 days. -Repeat 2D echo today. - Follow.    DVT prophylaxis: Heparin  Code Status: Full Family Communication: Updated patient, wife, sister at bedside. Disposition: Remain in stepdown unit.   Status is: Inpatient Remains inpatient appropriate because: Severity of illness   Consultants:  PCCM: Dr. Meade 01/20/2024 Interventional radiology: Dr. Jennefer 01/21/2024 Radiation oncology: Dr. Shannon 01/20/2024 Hematology/oncology: Dr. Odean 01/22/2024  Procedures:  Ultrasound-guided right supraclavicular mass biopsy per IR: Dr. Jennefer 01/21/2024 Image guided thoracentesis per PCCM: Dr.Desai 01/20/2024 CT head 01/19/2024 CT angiogram chest 01/19/2024 CT soft tissue neck 01/19/2024 Chest x-ray 01/19/2024, 01/20/2024, 01/21/2024, 22-Feb-2024 2D echo 01/20/2024   Antimicrobials:  Anti-infectives (From admission, onward)    Start     Dose/Rate Route Frequency Ordered Stop   01/19/24 1730  azithromycin  (ZITHROMAX ) 500 mg in sodium chloride  0.9 % 250 mL IVPB  Status:  Discontinued        500 mg 250 mL/hr over 60 Minutes Intravenous Every 24 hours 01/19/24 1633 01/21/24 0926   01/19/24 1630  azithromycin  (ZITHROMAX ) tablet 500 mg  Status:  Discontinued        500 mg Oral Daily 01/19/24 1628 01/19/24 1633          Subjective: Patient sitting up in bed, some use of accessory muscles of respiration with some thoracoabdominal breathing.  Patient on 15 L high flow nasal cannula.  Patient with hoarseness.  Patient noted to have required BiPAP for about 2 to 3 hours overnight.  Family at bedside.  Objective: Vitals:   02/03/24 0700 03-Feb-2024 0727 2024/02/03 0800 03-Feb-2024 0900  BP: (!) 131/59  (!) 148/53 (!) 117/49  Pulse: 67  70 72  Resp: (!) 22  (!) 24 (!) 22  Temp:  (!) 96.9 F (36.1 C)    TempSrc:  Axillary    SpO2: 92%  93% 96%  Weight:      Height:        Intake/Output Summary (Last 24 hours) at Feb 03, 2024 1027 Last data filed at 02/03/2024 0830 Gross per 24 hour  Intake 781.91 ml  Output 1325 ml  Net -543.09 ml   Filed Weights   01/19/24 0800  Weight: 61.2 kg    Examination:  General exam: Appears calm and comfortable  Respiratory system: Clear to auscultation.  No wheezes, no crackles, no rhonchi.  No use of accessory muscles of respiration with some thoracoabdominal breathing.  On 15 L high flow nasal cannula.  Cardiovascular system: RRR no murmurs rubs or gallops.  No JVD.  No pitting lower extremity edema. Gastrointestinal system: Abdomen is soft, nontender, nondistended, positive bowel sounds.  No rebound.  No guarding.  Central nervous system: Alert and oriented. No focal neurological deficits. Extremities: Symmetric 5 x 5 power. Skin: No rashes, lesions or ulcers Psychiatry: Judgement and insight appear normal. Mood & affect appropriate.     Data Reviewed: I have personally reviewed following labs and imaging studies  CBC: Recent Labs  Lab 01/19/24 0726 01/20/24 0620 01/21/24 0332 01/22/24 0324 2024/02/03 0339  WBC 10.1 14.7* 18.8* 14.1* 12.6*  NEUTROABS 7.9*  --  15.3*  --  10.4*  HGB 12.4* 11.3* 10.2* 11.1* 11.3*  HCT 39.1 35.9* 33.6* 35.4* 36.8*  MCV 98.5 96.8 98.8 98.6 100.0  PLT 325 301 255 243 235    Basic Metabolic Panel: Recent Labs  Lab  01/19/24 0726 01/20/24 0620 01/21/24 0332 01/22/24 0324 03-Feb-2024 0339  NA 139 142 141 140 142  K 4.3 3.8 4.0 4.2 4.4  CL 108 112* 113* 111 110  CO2 21* 20* 17* 21* 23  GLUCOSE 264* 90 94 239* 211*  BUN 40* 48* 51* 54* 53*  CREATININE 1.77* 1.89* 1.85* 2.02* 1.83*  CALCIUM  9.0 9.2 8.5* 8.5* 8.5*  MG  --  2.0  --   --  2.2  PHOS  --  4.0  --   --   --     GFR: Estimated Creatinine Clearance: 32 mL/min (A) (by C-G formula based on SCr of 1.83 mg/dL (H)).  Liver Function Tests: Recent Labs  Lab 01/19/24 0726 01/21/24 0332 01/22/24 0324 03-Feb-2024 0339  AST 26 45* 36 42*  ALT 26 30 30  42  ALKPHOS 104 95 85 91  BILITOT 0.3 0.6 0.6 0.6  PROT 6.4* 4.9* 5.1* 5.6*  ALBUMIN 3.1* 2.4* 2.4* 2.5*    CBG: Recent Labs  Lab 01/22/24 0756  01/22/24 1159 01/22/24 1556 01/22/24 2141 Feb 16, 2024 0727  GLUCAP 238* 260* 366* 223* 135*     Recent Results (from the past 240 hours)  Resp panel by RT-PCR (RSV, Flu A&B, Covid) Anterior Nasal Swab     Status: None   Collection Time: 01/19/24  7:20 AM   Specimen: Anterior Nasal Swab  Result Value Ref Range Status   SARS Coronavirus 2 by RT PCR NEGATIVE NEGATIVE Final    Comment: (NOTE) SARS-CoV-2 target nucleic acids are NOT DETECTED.  The SARS-CoV-2 RNA is generally detectable in upper respiratory specimens during the acute phase of infection. The lowest concentration of SARS-CoV-2 viral copies this assay can detect is 138 copies/mL. A negative result does not preclude SARS-Cov-2 infection and should not be used as the sole basis for treatment or other patient management decisions. A negative result may occur with  improper specimen collection/handling, submission of specimen other than nasopharyngeal swab, presence of viral mutation(s) within the areas targeted by this assay, and inadequate number of viral copies(<138 copies/mL). A negative result must be combined with clinical observations, patient history, and  epidemiological information. The expected result is Negative.  Fact Sheet for Patients:  BloggerCourse.com  Fact Sheet for Healthcare Providers:  SeriousBroker.it  This test is no t yet approved or cleared by the [place]  FDA and  has been authorized for detection and/or diagnosis of SARS-CoV-2 by FDA under an Emergency Use Authorization (EUA). This EUA will remain  in effect (meaning this test can be used) for the duration of the COVID-19 declaration under Section 564(b)(1) of the Act, 21 U.S.C.section 360bbb-3(b)(1), unless the authorization is terminated  or revoked sooner.       Influenza A by PCR NEGATIVE NEGATIVE Final   Influenza B by PCR NEGATIVE NEGATIVE Final    Comment: (NOTE) The Xpert Xpress SARS-CoV-2/FLU/RSV plus assay is intended as an aid in the diagnosis of influenza from Nasopharyngeal swab specimens and should not be used as a sole basis for treatment. Nasal washings and aspirates are unacceptable for Xpert Xpress SARS-CoV-2/FLU/RSV testing.  Fact Sheet for Patients: BloggerCourse.com  Fact Sheet for Healthcare Providers: SeriousBroker.it  This test is not yet approved or cleared by the [place]  FDA and has been authorized for detection and/or diagnosis of SARS-CoV-2 by FDA under an Emergency Use Authorization (EUA). This EUA will remain in effect (meaning this test can be used) for the duration of the COVID-19 declaration under Section 564(b)(1) of the Act, 21 U.S.C. section 360bbb-3(b)(1), unless the authorization is terminated or revoked.     Resp Syncytial Virus by PCR NEGATIVE NEGATIVE Final    Comment: (NOTE) Fact Sheet for Patients: BloggerCourse.com  Fact Sheet for Healthcare Providers: SeriousBroker.it  This test is not yet approved or cleared by the [place]  FDA and has been  authorized for detection and/or diagnosis of SARS-CoV-2 by FDA under an Emergency Use Authorization (EUA). This EUA will remain in effect (meaning this test can be used) for the duration of the COVID-19 declaration under Section 564(b)(1) of the Act, 21 U.S.C. section 360bbb-3(b)(1), unless the authorization is terminated or revoked.  Performed at [facility], [address], [place], [place] [address]   MRSA Next Gen by PCR, Nasal     Status: None   Collection Time: 01/19/24  7:24 PM   Specimen: Nasal Mucosa; Nasal Swab  Result Value Ref Range Status   MRSA by PCR Next Gen NOT DETECTED NOT DETECTED Final    Comment: (NOTE) The GeneXpert MRSA Assay (  FDA approved for NASAL specimens only), is one component of a comprehensive MRSA colonization surveillance program. It is not intended to diagnose MRSA infection nor to guide or monitor treatment for MRSA infections. Test performance is not FDA approved in patients less than 87 years old. Performed at [facility], [address] [place], [place] [address]   Body fluid culture w Gram Stain     Status: None (Preliminary result)   Collection Time: 01/20/24  2:59 PM   Specimen: Pleural Fluid  Result Value Ref Range Status   Specimen Description   Final    PLEURAL Performed at [facility], [address], [place], [place] [address]    Special Requests   Final    NONE Performed at [facility], [address], [place], [place] [address]    Gram Stain   Final    WBC PRESENT, PREDOMINANTLY MONONUCLEAR NO ORGANISMS SEEN CYTOSPIN SMEAR    Culture   Final    NO GROWTH 2 DAYS Performed at [facility], [address] [place], [place] [address]    Report Status PENDING  Incomplete         Radiology Studies: US  RENAL Result Date: 01/22/2024 CLINICAL DATA:  [id number] ARF (acute renal failure) (HCC) [id number] EXAM: RENAL / URINARY TRACT ULTRASOUND COMPLETE COMPARISON:   April 03, 2023 FINDINGS: Right Kidney: Renal measurements: 12.6 x 9.7 x 4.3 cm = volume: 278 mL. Increased echogenicity. Small upper pole cyst measuring 1.7 cm. Multi septated cyst in the lower pole again noted, measuring 4.9 x 4.7 x 3.2 cm on today's exam. This is likely due to partial obscuration of the lower portion from overlying bowel gas. No hydronephrosis or nephrolithiasis. Left Kidney: Renal measurements: 12.7 x 5.6 x 6.3 cm = volume: 232 mL. Increased echogenicity. Lower pole cyst measuring 1.1 cm. No hydronephrosis or nephrolithiasis. Bladder: Circumferential wall thickening of the urinary bladder, which is decompressed, with a urinary catheter in place. Other: Bilateral pleural effusions. IMPRESSION: 1. No hydronephrosis or nephrolithiasis. Renal parenchymal changes, which can be seen in both acute and chronic medical renal disease. 2. Circumferential wall thickening of the urinary bladder, which may be due to underdistension or acute cystitis. Correlation with urinalysis recommended. 3. Redemonstrated, partially visualized multi septated cyst in the lower pole of the right kidney, measuring 4.9 x 4.3 x 3.2 cm. While this lesion measures smaller on today's exam, this is likely due to partial obscuration from overlying bowel gas. Nonemergent urologic follow-up recommended for further management of this Bosniak III lesion. These results will be called to the ordering clinician or representative by the Radiologist Assistant and communication documented in the PACS or [employer]. Electronically Signed   By: Rogelia Myers M.D.   On: 01/22/2024 19:46   US  EKG SITE RITE Result Date: 01/22/2024 If Site Rite image not attached, placement could not be confirmed due to current cardiac rhythm.  US  CORE BIOPSY (LYMPH NODES) Result Date: 01/22/2024 INDICATION: 73 year old male with indeterminate cervical lymphadenopathy. EXAM: Ultrasound-guided right supraclavicular mass biopsy. MEDICATIONS: None.  ANESTHESIA/SEDATION: None. FLUOROSCOPY TIME:  None. COMPLICATIONS: None immediate. PROCEDURE: Informed written consent was obtained from the patient after a thorough discussion of the procedural risks, benefits and alternatives. All questions were addressed. Maximal Sterile Barrier Technique was utilized including caps, mask, sterile gowns, sterile gloves, sterile drape, hand hygiene and skin antiseptic. A timeout was performed prior to the initiation of the procedure. Preprocedure ultrasound evaluation demonstrated multifocal heterogeneously hypoechoic masses  in the right neck and supraclavicular region compatible with lymphadenopathy. The largest right supraclavicular mass was then targeted for biopsy. Subdermal Local anesthesia was administered at the planned needle entry site with 1% lidocaine . A small skin nick was made. Under direct ultrasound visualization, a 17 gauge coaxial introducer needle was directed to the periphery of the mass. This followed by acquisition of 2, 18 gauge core biopsies. The samples were placed in saline and formalin. The needle was removed. Postprocedure ultrasound evaluation demonstrated no evidence of surrounding hematoma or other complicating features. A sterile bandage was applied. The patient tolerated the procedure well. IMPRESSION: Technically successful right supraclavicular mass/lymph node biopsy. Ester Sides, MD [facility] Radiology Electronically Signed   By: Ester Sides M.D.   On: 01/22/2024 15:14        Scheduled Meds:  arformoterol   15 mcg Nebulization BID   aspirin  EC  81 mg Oral Q breakfast   budesonide  (PULMICORT ) nebulizer solution  0.5 mg Nebulization BID   Chlorhexidine  Gluconate Cloth  6 each Topical Q0600   dextromethorphan -guaiFENesin   1 tablet Oral BID   dextrose   25 g Intravenous Once   gabapentin   600 mg Oral TID   heparin   5,000 Units Subcutaneous Q8H   insulin  aspart  0-15 Units  Subcutaneous TID WC   insulin  aspart  0-5 Units Subcutaneous QHS   insulin  aspart  3 Units Subcutaneous TID WC   insulin  glargine-yfgn  15 Units Subcutaneous QHS   ipratropium-albuterol   3 mL Nebulization TID   methylPREDNISolone  (SOLU-MEDROL ) injection  40 mg Intravenous Daily   nicotine   21 mg Transdermal Daily   pantoprazole   40 mg Oral Daily   rosuvastatin   10 mg Oral Daily   sodium chloride  flush  3 mL Intravenous Q12H   sodium chloride  flush  3 mL Intravenous Q12H   Continuous Infusions:  sodium chloride         LOS: 4 days    Time spent: 45 minutes    Toribio Hummer, MD [facility]   To contact the attending provider between 7A-7P or the covering provider during after hours 7P-7A, please log into the web site [email or web address] and access using universal [place] password for that web site. If you do not have the password, please call the hospital operator.  Jan 27, 2024, 10:27 AM

## 2024-01-24 NOTE — Death Summary Note (Signed)
 DEATH SUMMARY   Patient Details  Name: Oscar Mills MRN: [id number] DOB: 04-Jul-1950 ERE:Ejmilz, Lauraine SAILOR, DO Admission/Discharge Information   Admit Date:  01/31/24  Date of Death: Date of Death: 02/04/2024  Time of Death: Time of Death: Feb 14, 1800  Length of Stay: 4   Principle Cause of death: Metastatic small cell carcinoma  Hospital Diagnoses: Principal Problem:   Acute respiratory failure with hypoxia (HCC) Active Problems:   Lymphadenopathy   Small cell carcinoma (HCC)   PAD (peripheral artery disease) (HCC)   Hypertension associated with diabetes (HCC)   Uncontrolled diabetes mellitus with hyperglycemia (HCC)   Acute hypoxic respiratory failure (HCC)   Pleural effusion   Pericardial effusion   Lymphadenopathy, mediastinal   SVC syndrome   AKI (acute kidney injury) (HCC)   COPD with acute exacerbation (HCC)   High risk medication use   Shortness of breath   DNR (do not resuscitate)   ACP (advance care planning)   Counseling and coordination of care   Medication management   End of life care   Palliative care encounter   Hospital Course: 73 year old male, history of tobacco abuse, alcohol  use, COPD, HTN, DM 2, peripheral arterial disease, right renal mass, presented to [facility] for symptoms of shortness of breath, respiratory distress, was hypoxic.  Was placed on BiPAP in the ED.  Was previously seen at [facility] ED on 01/09/2024, was prescribed Augmentin  and inhalers, however symptoms worsened.  He he was seen by oncologist (Dr. Annah Skene) on 03/21/2021 for weight loss.  He also had a CT scan of the abdomen and pelvis in 7/23 that showed cystic subcentimeter right renal lesion concerning for renal cell carcinoma.  Renal mass protocol CT or MRI was recommended however patient did not follow through. CT scan of the head without any acute findings,  CT scan neck soft tissue shows necrotic lymphadenopathy in the neck bilaterally consistent with metastatic disease.  Small  amount of low-density material in the subglottic larynx and proximal trachea, indeterminate for mucosal mass.  Diffuse soft tissue swelling in the neck may be due to venous obstruction. CTA chest negative for PE or thoracic dissection.  Bulky supraclavicular, mediastinal and right hilar adenopathy.  SVC narrowing.  Metastatic disease or lymphoma primary considerations.  Moderate bilateral pleural effusions.  Moderate pericardial effusion.  7.4 cm complex cystic mass of the right renal hilum, previously 6 cm.  Assessment and Plan: No notes have been filed under this hospital service. Service: Hospitalist  #1 acute hypoxemic respiratory failure secondary to metastatic small cell carcinoma - Likely multifactorial secondary to extensive adenopathy with concern for metastatic disease versus lymphoma as well as pleural effusion noted in the setting of COPD.  Supraclavicular biopsies findings consistent with small cell carcinoma, cytology from pleural fluid consistent with small cell carcinoma. - Patient noted initially on NIV initially subsequently transitioned to nonrebreather and then noted to have increased O2 requirements of 15 L high flow nasal cannula.  - Patient status post thoracentesis on the right side with removal of 2 L of fluid. -Patient was placed on scheduled DuoNebs, Mucinex , IV steroids, Brovana , Pulmicort . -Patient seen by radiation oncology and initially had declined radiation treatment.  It was noted that patient was for possible simulation and treatment however patient continued to decline. -PCCM was reengaged due to need for access. -Palliative care consulted and patient was subsequently transitioned to full comfort measures. -Patient was kept comfortable and patient subsequently died at 1801 hrs. on 02/04/2024.   2.  Extensive mediastinal/right hilar and right supraclavicular lymphadenopathy/concern for SVC syndrome/small cell carcinoma - Patient was seen by PCCM who consulted  radiation oncology and plan was for radiation therapy however initially patient declined. - Patient also seen in consultation by IR and underwent ultrasound-guided right supraclavicular mass biopsy on 01/21/2024 with findings consistent with small cell carcinoma.  -Cytology from pleural fluid consistent with small cell carcinoma. - Patient assessed for SVC stent however per IR risks outweigh benefits at this time and if symptoms were to worsen then reengage IR. - Patient reassessed by radiation oncology who discussed with family natural history of SVC syndrome, general treatment, role of radiotherapy and management and family have agreed that they would like to receive simulation and radiation treatment with eventual plans for transfer of his radiation treatment to [place] once clinically stable for transfer and are awaiting diagnosis of malignancy prior to starting treatment. - Patient seen in consultation by hematology/oncology who discussed with family about possibility of small cell lung cancer and discussed what their plans are in regards to chemotherapy.  Per oncology with chemotherapy patient may have several months to a year without chemotherapy may have few days to weeks to survive.   - Family noted to lean away towards chemotherapy and seen by oncology.   - Palliative care consultation was obtained and decision was made to transition to full comfort measures.   - Patient was kept comfortable.   - Patient subsequently died at 1801 hrs. on February 12, 2024.     3.  Right renal cell mass -?  Metastatic malignancy versus other etiologies. - Patient status post ultrasound-guided supraclavicular mass biopsy with findings of small cell carcinoma.    4.  AKI -??  Etiology -Noted to have received CT angiogram chest with contrast. - Urinalysis with small hemoglobin, small leukocytes, specific gravity 1.014, protein negative.   - Urine sodium 115, urine creatinine of 73.  - Urine output of 1.125 L over  the past 24 hours - Renal ultrasound negative for hydronephrosis or nephrolithiasis.  Renal parenchyma changes consistent with both acute and chronic medical renal disease.  Circumferential wall thickening of the urinary bladder.  Multiseptated cyst in the lower pole of the right kidney noted.   - Patient placed on IV fluids however lost access.   -Renal function trended back down.   - Patient seen in consultation by palliative care as patient's respiratory status continued to decline and patient was subsequently transition to full comfort measures.   5.  Acute COPD exacerbation -Patient with increased O2 requirements during the hospitalization noted to be on 15 L high flow nasal cannula during her hospitalization on 02-12-2024.   - Patient was placed on IV Solu-Medrol , scheduled DuoNebs, Mucinex , PPI, Pulmicort  and Brovana .   - Patient noted to have increased work of breathing, PCCM reassessed patient.   - Patient also assessed by palliative care and decision made to transition to full comfort measures.   - Patient subsequently died at 1801 hrs. on February 12, 2024.   6.  PAD - Patient maintained on home regimen aspirin , Crestor .   - Plavix  held.   - Patient seen by palliative care and decision made to transition to full comfort measures.    7.  Diabetes mellitus type 2./Diabetic neuropathy -Hemoglobin A1c 10.4. - Patient placed on long-acting Semglee  as well as meal coverage NovoLog  and sliding scale insulin .   - Patient maintained on home regimen gabapentin .   - Patient seen in consultation by palliative care and decision was made to  transition to full comfort measures.     8.  Pericardial effusion - 2D echo done with moderate pericardial effusion but no definite cardiac tamponade noted. - Recommendations are for repeat limited 2D echo in 2 to 3 days. -Repeat 2D echo was done.   - Patient seen in consultation by palliative care and decision made to transition to full comfort measures.            Procedures:  Ultrasound-guided right supraclavicular mass biopsy per IR: Dr. Jennefer 01/21/2024 Image guided thoracentesis per PCCM: Dr.Desai 01/20/2024 CT head 01/19/2024 CT angiogram chest 01/19/2024 CT soft tissue neck 01/19/2024 Chest x-ray 01/19/2024, 01/20/2024, 01/21/2024, 02/19/2024 2D echo 01/20/2024, 2024/02/19  Consultations:  PCCM: Dr. Meade 01/20/2024 Interventional radiology: Dr. Jennefer 01/21/2024 Radiation oncology: Dr. Shannon 01/20/2024 Hematology/oncology: Dr. Odean 01/22/2024 Palliative care: Dr. Navarre February 19, 2024  The results of significant diagnostics from this hospitalization (including imaging, microbiology, ancillary and laboratory) are listed below for reference.   Significant Diagnostic Studies: ECHOCARDIOGRAM LIMITED Result Date: 02-19-2024    ECHOCARDIOGRAM LIMITED REPORT   Patient Name:   Oscar Mills Date of Exam: 02-19-2024 Medical Rec #:  [id number]          Height:       74.0 in Accession #:    [id number]         Weight:       134.9 lb Date of Birth:  1950/11/15          BSA:          1.839 m Patient Age:    73 years           BP:           102/68 mmHg Patient Gender: M                  HR:           77 bpm. Exam Location:  Inpatient Procedure: Limited Color Doppler and Cardiac Doppler (Both Spectral and Color            Flow Doppler were utilized during procedure). Indications:    Syncope  History:        Patient has prior history of Echocardiogram examinations, most                 recent 10/22/2023. Risk Factors:Diabetes.  Sonographer:    Benard Stallion Referring Phys: [id number] Tationa Stech V Montrail Mehrer IMPRESSIONS  1. Left ventricular ejection fraction, by estimation, is 65 to 70%. The left ventricle has normal function. There is mild concentric left ventricular hypertrophy.  2. Right ventricular systolic function is normal. The right ventricular size is normal. Mildly increased right ventricular wall thickness. There is normal pulmonary artery systolic pressure.  3.  Moderate pericardial effusion. The pericardial effusion is surrounding the apex, anterior to the right ventricle and localized near the right atrium. Large pleural effusion in the left lateral region.  4. The aortic valve is tricuspid. There is mild calcification of the aortic valve.  5. The inferior vena cava is normal in size with <50% respiratory variability, suggesting right atrial pressure of 8 mmHg. FINDINGS  Left Ventricle: Left ventricular ejection fraction, by estimation, is 65 to 70%. The left ventricle has normal function. There is mild concentric left ventricular hypertrophy. Right Ventricle: The right ventricular size is normal. Mildly increased right ventricular wall thickness. Right ventricular systolic function is normal. There is normal pulmonary artery systolic pressure. The tricuspid regurgitant velocity is 2.62 m/s, and with an assumed right  atrial pressure of 8 mmHg, the estimated right ventricular systolic pressure is 35.5 mmHg. Pericardium: There is a moderate pericardial effusion that is unchanged from previous. There is mild RA diastolic invagination and a dilated IVC (1.6cm with no respiratory variation) suggestive of early tamponade but there is no significant respiratoy variation in the mitral inflow pattern. Given biventricular hypertrophy and pericardial effusion cosndieration should be given to infiltrative process such as cardiac amyloidosis. A moderately sized pericardial effusion is present. The pericardial effusion is surrounding the apex, anterior to the right ventricle and localized near the right atrium. There is diastolic collapse of the right atrial wall. Tricuspid Valve: Tricuspid valve regurgitation is mild. Aortic Valve: The aortic valve is tricuspid. There is mild calcification of the aortic valve. Aorta: The aortic root is normal in size and structure. Venous: The inferior vena cava is normal in size with less than 50% respiratory variability, suggesting right atrial  pressure of 8 mmHg. Additional Comments: There is a large pleural effusion in the left lateral region.  LEFT VENTRICLE PLAX 2D LVIDd:         4.10 cm   Diastology LVIDs:         2.60 cm   LV e' medial:  5.98 cm/s LV PW:         1.00 cm   LV e' lateral: 10.25 cm/s LV IVS:        1.00 cm LVOT diam:     2.10 cm LVOT Area:     3.46 cm  LEFT ATRIUM         Index LA diam:    1.60 cm 0.87 cm/m   AORTA Ao Root diam: 3.40 cm TRICUSPID VALVE TR Peak grad:   27.5 mmHg TR Vmax:        262.00 cm/s  SHUNTS Systemic Diam: 2.10 cm Toribio Fuel MD Electronically signed by Toribio Fuel MD Signature Date/Time: 02-02-2024/6:12:58 PM    Final    DG CHEST PORT 1 VIEW Result Date: February 02, 2024 CLINICAL DATA:  Shortness of breath. EXAM: PORTABLE CHEST 1 VIEW COMPARISON:  Chest radiograph dated 01/21/2024. FINDINGS: Shallow inspiration. Slight interval increase in the size of the left pleural effusion and associated left lung base atelectasis or pneumonia. Right perihilar density or mass again noted. No pneumothorax. Stable cardiac silhouette no acute osseous pathology. IMPRESSION: Slight interval increase in the size of the left pleural effusion and associated left lung base atelectasis or pneumonia. Electronically Signed   By: Vanetta Chou M.D.   On: February 02, 2024 12:48   US  RENAL Result Date: 01/22/2024 CLINICAL DATA:  [id number] ARF (acute renal failure) (HCC) [id number] EXAM: RENAL / URINARY TRACT ULTRASOUND COMPLETE COMPARISON:  April 03, 2023 FINDINGS: Right Kidney: Renal measurements: 12.6 x 9.7 x 4.3 cm = volume: 278 mL. Increased echogenicity. Small upper pole cyst measuring 1.7 cm. Multi septated cyst in the lower pole again noted, measuring 4.9 x 4.7 x 3.2 cm on today's exam. This is likely due to partial obscuration of the lower portion from overlying bowel gas. No hydronephrosis or nephrolithiasis. Left Kidney: Renal measurements: 12.7 x 5.6 x 6.3 cm = volume: 232 mL. Increased echogenicity. Lower pole cyst measuring  1.1 cm. No hydronephrosis or nephrolithiasis. Bladder: Circumferential wall thickening of the urinary bladder, which is decompressed, with a urinary catheter in place. Other: Bilateral pleural effusions. IMPRESSION: 1. No hydronephrosis or nephrolithiasis. Renal parenchymal changes, which can be seen in both acute and chronic medical renal disease. 2. Circumferential wall thickening of the urinary bladder,  which may be due to underdistension or acute cystitis. Correlation with urinalysis recommended. 3. Redemonstrated, partially visualized multi septated cyst in the lower pole of the right kidney, measuring 4.9 x 4.3 x 3.2 cm. While this lesion measures smaller on today's exam, this is likely due to partial obscuration from overlying bowel gas. Nonemergent urologic follow-up recommended for further management of this Bosniak III lesion. These results will be called to the ordering clinician or representative by the Radiologist Assistant and communication documented in the PACS or [employer]. Electronically Signed   By: Rogelia Myers M.D.   On: 01/22/2024 19:46   US  EKG SITE RITE Result Date: 01/22/2024 If Site Rite image not attached, placement could not be confirmed due to current cardiac rhythm.  US  CORE BIOPSY (LYMPH NODES) Result Date: 01/22/2024 INDICATION: 73 year old male with indeterminate cervical lymphadenopathy. EXAM: Ultrasound-guided right supraclavicular mass biopsy. MEDICATIONS: None. ANESTHESIA/SEDATION: None. FLUOROSCOPY TIME:  None. COMPLICATIONS: None immediate. PROCEDURE: Informed written consent was obtained from the patient after a thorough discussion of the procedural risks, benefits and alternatives. All questions were addressed. Maximal Sterile Barrier Technique was utilized including caps, mask, sterile gowns, sterile gloves, sterile drape, hand hygiene and skin antiseptic. A timeout was performed prior to the initiation of the procedure. Preprocedure ultrasound evaluation  demonstrated multifocal heterogeneously hypoechoic masses in the right neck and supraclavicular region compatible with lymphadenopathy. The largest right supraclavicular mass was then targeted for biopsy. Subdermal Local anesthesia was administered at the planned needle entry site with 1% lidocaine . A small skin nick was made. Under direct ultrasound visualization, a 17 gauge coaxial introducer needle was directed to the periphery of the mass. This followed by acquisition of 2, 18 gauge core biopsies. The samples were placed in saline and formalin. The needle was removed. Postprocedure ultrasound evaluation demonstrated no evidence of surrounding hematoma or other complicating features. A sterile bandage was applied. The patient tolerated the procedure well. IMPRESSION: Technically successful right supraclavicular mass/lymph node biopsy. Ester Sides, MD [facility] Electronically Signed   By: Ester Sides M.D.   On: 01/22/2024 15:14   DG Chest 1 View Result Date: 01/21/2024 CLINICAL DATA:  Short of breath EXAM: CHEST  1 VIEW COMPARISON:  Radiograph 01/20/2024, CT 01/19/2024 FINDINGS: Normal cardiac silhouette. Persistent large RIGHT perihilar mass. Persistent widening of the upper mediastinum related lymphadenopathy. Moderate LEFT effusion slightly improved. IMPRESSION: 1. RIGHT perihilar mass concerning for malignancy. Mediastinal lymphadenopathy 2. Interval improvement in RIGHT pleural effusion compared to CT 01/19/2024 3. Persistent LEFT pleural effusion Electronically Signed   By: Jackquline Boxer M.D.   On: 01/21/2024 09:56   DG CHEST PORT 1 VIEW Result Date: 01/20/2024 CLINICAL DATA:  [id number] S/P right thoracentesis [id number] EXAM: PORTABLE CHEST - 1 VIEW COMPARISON:  January 19, 2023 FINDINGS: Near complete resolution of the right pleural effusion. Persistent moderate left pleural effusion with left basilar atelectasis. No pneumothorax. Mild  cardiomegaly. Similar right hilar and paratracheal fullness corresponding to the lymphadenopathy. No acute fracture or destructive lesions. Multilevel thoracic osteophytosis. IMPRESSION: 1. Near complete resolution of the right pleural effusion. No pneumothorax. 2. Persistent moderate volume left pleural effusion. Electronically Signed   By: Rogelia Myers M.D.   On: 01/20/2024 15:48   ECHOCARDIOGRAM COMPLETE Result Date: 01/20/2024    ECHOCARDIOGRAM REPORT   Patient Name:   Oscar Mills Date of Exam: 01/20/2024 Medical Rec #:  [id number]          Height:  74.0 in Accession #:    [id number]         Weight:       134.9 lb Date of Birth:  1950-11-19          BSA:          1.839 m Patient Age:    73 years           BP:           140/51 mmHg Patient Gender: M                  HR:           70 bpm. Exam Location:  Inpatient Procedure: 2D Echo, Color Doppler and Cardiac Doppler (Both Spectral and Color            Flow Doppler were utilized during procedure). Indications:    Dyspnea R06.00  History:        Patient has no prior history of Echocardiogram examinations.                 Risk Factors:Hypertension and Diabetes.  Sonographer:    Tinnie Gosling RDCS Referring Phys: [id number] COURAGE EMOKPAE IMPRESSIONS  1. Left ventricular ejection fraction, by estimation, is 60 to 65%. The left ventricle has normal function. The left ventricle has no regional wall motion abnormalities. There is mild left ventricular hypertrophy. Left ventricular diastolic parameters are consistent with Grade I diastolic dysfunction (impaired relaxation).  2. Right ventricular systolic function is mildly reduced. The right ventricular size is mildly enlarged. There is mildly elevated pulmonary artery systolic pressure. The estimated right ventricular systolic pressure is 40.0 mmHg.  3. Moderate pericardial effusion. The pericardial effusion is anterior to the right ventricle and surrounding the apex. BP 140/51 mmHg, HR 70 bpm at time of  echo. IVC is dilated and noncollapsible. No RV diastolic collapse. Hepatic vein doppler challenging to interpret. No significant RA collapse. Mild respiratory related septal shift. No significant variation in mitral inflow. Finding in combination indicate no definite tamponade, but with dilated IVC may indicate early constrictive/tamponade physiology. Consider repeat limited echo in 2-3 days or with change in hemodynamics. Large pleural effusion in the left lateral region.  4. The mitral valve is grossly normal. Trivial mitral valve regurgitation. No evidence of mitral stenosis.  5. The aortic valve is tricuspid. Aortic valve regurgitation is not visualized. No aortic stenosis is present.  6. The inferior vena cava is dilated in size with <50% respiratory variability, suggesting right atrial pressure of 15 mmHg. FINDINGS  Left Ventricle: Left ventricular ejection fraction, by estimation, is 60 to 65%. The left ventricle has normal function. The left ventricle has no regional wall motion abnormalities. The left ventricular internal cavity size was normal in size. There is  mild left ventricular hypertrophy. Left ventricular diastolic parameters are consistent with Grade I diastolic dysfunction (impaired relaxation). Right Ventricle: The right ventricular size is mildly enlarged. No increase in right ventricular wall thickness. Right ventricular systolic function is mildly reduced. There is mildly elevated pulmonary artery systolic pressure. The tricuspid regurgitant  velocity is 2.50 m/s, and with an assumed right atrial pressure of 15 mmHg, the estimated right ventricular systolic pressure is 40.0 mmHg. Left Atrium: Left atrial size was normal in size. Right Atrium: Right atrial size was normal in size. Pericardium: A moderately sized pericardial effusion is present. The pericardial effusion is anterior to the right ventricle and surrounding the apex. There is no evidence of cardiac tamponade. Presence of epicardial  fat layer. Mitral Valve: The mitral valve is grossly normal. Trivial mitral valve regurgitation. No evidence of mitral valve stenosis. Tricuspid Valve: The tricuspid valve is normal in structure. Tricuspid valve regurgitation is trivial. No evidence of tricuspid stenosis. Aortic Valve: The aortic valve is tricuspid. Aortic valve regurgitation is not visualized. No aortic stenosis is present. Pulmonic Valve: The pulmonic valve was normal in structure. Pulmonic valve regurgitation is trivial. No evidence of pulmonic stenosis. Aorta: The aortic root is normal in size and structure. Venous: The inferior vena cava is dilated in size with less than 50% respiratory variability, suggesting right atrial pressure of 15 mmHg. IAS/Shunts: The interatrial septum is aneurysmal. The interatrial septum appears to be lipomatous. The interatrial septum was not well visualized. Additional Comments: There is a large pleural effusion in the left lateral region.  LEFT VENTRICLE PLAX 2D LVIDd:         4.40 cm   Diastology LVIDs:         3.10 cm   LV e' medial:    5.00 cm/s LV PW:         1.00 cm   LV E/e' medial:  11.7 LV IVS:        1.00 cm   LV e' lateral:   8.05 cm/s LVOT diam:     2.00 cm   LV E/e' lateral: 7.3 LV SV:         68 LV SV Index:   37 LVOT Area:     3.14 cm  RIGHT VENTRICLE             IVC RV S prime:     21.20 cm/s  IVC diam: 2.50 cm TAPSE (M-mode): 1.3 cm LEFT ATRIUM           Index        RIGHT ATRIUM           Index LA diam:      2.80 cm 1.52 cm/m   RA Area:     11.20 cm LA Vol (A4C): 42.9 ml 23.33 ml/m  RA Volume:   20.30 ml  11.04 ml/m  AORTIC VALVE LVOT Vmax:   94.60 cm/s LVOT Vmean:  70.200 cm/s LVOT VTI:    0.217 m  AORTA Ao Root diam: 3.20 cm Ao Asc diam:  3.40 cm MITRAL VALVE               TRICUSPID VALVE MV Area (PHT): 2.82 cm    TR Peak grad:   25.0 mmHg MV E velocity: 58.70 cm/s  TR Vmax:        250.00 cm/s MV A velocity: 64.30 cm/s MV E/A ratio:  0.91        SHUNTS                            Systemic  VTI:  0.22 m                            Systemic Diam: 2.00 cm Soyla Merck MD Electronically signed by Soyla Merck MD Signature Date/Time: 01/20/2024/10:21:17 AM    Final    CT Angio Chest PE W and/or Wo Contrast Result Date: 01/19/2024 CLINICAL DATA:  Pulmonary embolism (PE) suspected, low to intermediate prob, positive D-dimer EXAM: CT ANGIOGRAPHY CHEST WITH CONTRAST TECHNIQUE: Multidetector CT imaging of the chest was performed using the standard protocol during bolus administration of intravenous contrast. Multiplanar  CT image reconstructions and MIPs were obtained to evaluate the vascular anatomy. RADIATION DOSE REDUCTION: This exam was performed according to the departmental dose-optimization program which includes automated exposure control, adjustment of the mA and/or kV according to patient size and/or use of iterative reconstruction technique. CONTRAST:  60mL OMNIPAQUE  IOHEXOL  350 MG/ML SOLN COMPARISON:  CT abdomen 01/19/2022, chest 02/16/2006 FINDINGS: Cardiovascular: SVC narrowing by mediastinal mass, without significant collateral filling. Heart size normal. Moderate pericardial effusion. Satisfactory opacification of pulmonary arteries noted, and there is no evidence of pulmonary emboli. Mild left coronary calcifications. Adequate contrast opacification of the thoracic aorta with no evidence of dissection, aneurysm, or stenosis. There is classic 3-vessel brachiocephalic arch anatomy without proximal stenosis. Scattered calcified plaque in the arch and descending thoracic aorta. Mediastinum/Nodes: Bulky bilateral supraclavicular adenopathy. Confluent anterior mediastinal and right paratracheal mass. Subcarinal and pre-vascular adenopathy. Confluent right hilar mass or adenopathy, encasing branches of the right upper lobe pulmonary artery. Mild left axillary adenopathy. Lungs/Pleura: Moderate bilateral pleural effusions. Atelectasis of most of the left lobe, and dependent aspects of lingula in  right lower lobe. Scattered bilateral pulmonary nodules, largest on the left none mm subpleural in the upper lobe (7:91) , on the right in the superior segment lower lobe 7 mm (110:7) . no pneumothorax. Upper Abdomen: Left para-aortic adenopathy up to 1.7 cm. Extensive coarse calcifications in the pancreatic body and tail suggesting chronic pancreatitis. 7.4 cm complex cystic mass at the right renal hilum, previously 6 cm. Musculoskeletal: Bilateral shoulder DJD. Vertebral endplate spurring at multiple levels in the mid thoracic spine. Review of the MIP images confirms the above findings. IMPRESSION: 1. Negative for acute PE or thoracic aortic dissection. 2. Bulky supraclavicular, mediastinal, and right hilar adenopathy, with SVC narrowing. Metastatic disease or lymphoma primary considerations. Follow-up recommended. 3. New moderate bilateral pleural effusions with left greater than right atelectasis. 4. Increased moderate pericardial effusion. 5. 7.4 cm complex cystic mass at the right renal hilum, previously 6 cm. 6.  Aortic Atherosclerosis (ICD10-I70.0). Electronically Signed   By: JONETTA Faes M.D.   On: 01/19/2024 13:41   CT Soft Tissue Neck W Contrast Result Date: 01/19/2024 CLINICAL DATA:  Left neck soft tissue mass. Recent antibiotic treatment for tonsillitis. Difficulty breathing. Hypoxia. EXAM: CT NECK WITH CONTRAST TECHNIQUE: Multidetector CT imaging of the neck was performed using the standard protocol following the bolus administration of intravenous contrast. RADIATION DOSE REDUCTION: This exam was performed according to the departmental dose-optimization program which includes automated exposure control, adjustment of the mA and/or kV according to patient size and/or use of iterative reconstruction technique. CONTRAST:  60mL OMNIPAQUE  IOHEXOL  350 MG/ML SOLN COMPARISON:  None Available. FINDINGS: Pharynx and larynx: Mildly limited assessment due to mild motion artifact. Symmetric tonsils without  substantial enlargement. Normal epiglottis. Patent airway. Small amount of low-density, mildly nodular material posterolateral E on the right in the subglottic larynx and proximal trachea (series 3, images 88 and 93) individually measuring up to 1.5 cm in craniocaudal dimension. Mild retropharyngeal edema or minimal fluid without organized retropharyngeal or peritonsillar fluid collection. Salivary glands: No inflammation, mass, or stone. Thyroid : Unremarkable. Lymph nodes: Necrotic lymphadenopathy in the neck bilaterally including conglomerate bilateral level IV/supraclavicular nodal masses measuring 5.0 x 4.3 x 6.5 cm on the right and 4.6 x 3.6 x 6.6 cm on the left. Additional smaller pathologic lymph nodes more superiorly in the neck bilaterally including a 1.7 cm short axis necrotic left submandibular node. Vascular: The internal jugular veins are compressed in the lower neck  by the nodal masses but are patent more superiorly in the neck. Calcified atherosclerosis in the carotid bulbs with at least a moderate stenosis of the proximal right ICA. Limited intracranial: Unremarkable. Visualized orbits: Bilateral cataract extraction. Mastoids and visualized paranasal sinuses: Minimal mucosal thickening in the paranasal sinuses. Clear mastoid air cells. Skeleton: Moderate cervical spondylosis.  Dental caries. Upper chest: More fully evaluated on the separately reported contemporaneous CT of the chest. Other: Relatively widespread soft tissue swelling in the left greater than right neck, nonspecific but may be secondary to venous obstruction in the superior mediastinum. IMPRESSION: 1. Necrotic lymphadenopathy in the neck bilaterally consistent with metastatic disease. See the separately reported chest CT for thoracic findings. 2. Small amount of low-density material in the subglottic larynx and proximal trachea, indeterminate for adherent mucus or mass. Correlate with direct visualization. 3. Diffuse soft tissue  swelling in the neck which may be secondary to venous obstruction. Electronically Signed   By: Dasie Hamburg M.D.   On: 01/19/2024 13:31   CT Head Wo Contrast Result Date: 01/19/2024 CLINICAL DATA:  Altered mental status.  Ataxia. EXAM: CT HEAD WITHOUT CONTRAST TECHNIQUE: Contiguous axial images were obtained from the base of the skull through the vertex without intravenous contrast. RADIATION DOSE REDUCTION: This exam was performed according to the departmental dose-optimization program which includes automated exposure control, adjustment of the mA and/or kV according to patient size and/or use of iterative reconstruction technique. COMPARISON:  None Available. FINDINGS: Brain: There is no evidence of an acute infarct, intracranial hemorrhage, mass, midline shift, or extra-axial fluid collection. There is mild cerebral atrophy. Periventricular white matter hypodensities are nonspecific but compatible with mild chronic small vessel ischemic disease. Vascular: Calcified atherosclerosis at the skull base. No hyperdense vessel. Skull: No acute fracture or suspicious lesion. Sinuses/Orbits: Minimal mucosal thickening in the paranasal sinuses. Clear mastoid air cells. Bilateral cataract extraction. Other: None. IMPRESSION: 1. No evidence of acute intracranial abnormality. 2. Mild chronic small vessel ischemic disease. Electronically Signed   By: Dasie Hamburg M.D.   On: 01/19/2024 13:15   DG Chest Port 1 View Result Date: 01/19/2024 CLINICAL DATA:  Chest pain EXAM: PORTABLE CHEST 1 VIEW COMPARISON:  01/09/2024 FINDINGS: Bibasilar collapse/consolidation with moderate left and small right pleural effusions. Interstitial markings are diffusely coarsened with chronic features. Persistent right paratracheal and right mediastinal/hilar fullness. No acute bony abnormality. Telemetry leads overlie the chest. IMPRESSION: 1. Bibasilar collapse/consolidation with moderate left and small right pleural effusions, progressive in  the interval since prior study. 2. Persistent right paratracheal and right mediastinal/hilar fullness. CT chest with contrast recommended to further evaluate. Electronically Signed   By: Camellia Candle M.D.   On: 01/19/2024 07:59   DG Chest 2 View Result Date: 01/09/2024 CLINICAL DATA:  Shortness of breath EXAM: CHEST - 2 VIEW COMPARISON:  02/22/2006 FINDINGS: Cardiac shadow is within normal limits. Aortic calcifications are noted. Bilateral pleural effusions are seen left greater than right. Fullness is noted in the right perihilar region. This may represent underlying adenopathy and or mass. CT of the chest with contrast is recommended for further evaluation. Possible nodular density in the right base is noted. No acute bony abnormality is noted. IMPRESSION: Fullness in the right perihilar region and right paratracheal region. Changes are suspicious for underlying lymphadenopathy/mass. CT with contrast is recommended for further evaluation. Bilateral pleural effusions left greater than right. Electronically Signed   By: Oneil Devonshire M.D.   On: 01/09/2024 03:54    Microbiology: Recent Results (from the  past 240 hours)  Resp panel by RT-PCR (RSV, Flu A&B, Covid) Anterior Nasal Swab     Status: None   Collection Time: 01/19/24  7:20 AM   Specimen: Anterior Nasal Swab  Result Value Ref Range Status   SARS Coronavirus 2 by RT PCR NEGATIVE NEGATIVE Final    Comment: (NOTE) SARS-CoV-2 target nucleic acids are NOT DETECTED.  The SARS-CoV-2 RNA is generally detectable in upper respiratory specimens during the acute phase of infection. The lowest concentration of SARS-CoV-2 viral copies this assay can detect is 138 copies/mL. A negative result does not preclude SARS-Cov-2 infection and should not be used as the sole basis for treatment or other patient management decisions. A negative result may occur with  improper specimen collection/handling, submission of specimen other than nasopharyngeal swab,  presence of viral mutation(s) within the areas targeted by this assay, and inadequate number of viral copies(<138 copies/mL). A negative result must be combined with clinical observations, patient history, and epidemiological information. The expected result is Negative.  Fact Sheet for Patients:  BloggerCourse.com  Fact Sheet for Healthcare Providers:  SeriousBroker.it  This test is no t yet approved or cleared by the [place]  FDA and  has been authorized for detection and/or diagnosis of SARS-CoV-2 by FDA under an Emergency Use Authorization (EUA). This EUA will remain  in effect (meaning this test can be used) for the duration of the COVID-19 declaration under Section 564(b)(1) of the Act, 21 U.S.C.section 360bbb-3(b)(1), unless the authorization is terminated  or revoked sooner.       Influenza A by PCR NEGATIVE NEGATIVE Final   Influenza B by PCR NEGATIVE NEGATIVE Final    Comment: (NOTE) The Xpert Xpress SARS-CoV-2/FLU/RSV plus assay is intended as an aid in the diagnosis of influenza from Nasopharyngeal swab specimens and should not be used as a sole basis for treatment. Nasal washings and aspirates are unacceptable for Xpert Xpress SARS-CoV-2/FLU/RSV testing.  Fact Sheet for Patients: BloggerCourse.com  Fact Sheet for Healthcare Providers: SeriousBroker.it  This test is not yet approved or cleared by the [place]  FDA and has been authorized for detection and/or diagnosis of SARS-CoV-2 by FDA under an Emergency Use Authorization (EUA). This EUA will remain in effect (meaning this test can be used) for the duration of the COVID-19 declaration under Section 564(b)(1) of the Act, 21 U.S.C. section 360bbb-3(b)(1), unless the authorization is terminated or revoked.     Resp Syncytial Virus by PCR NEGATIVE NEGATIVE Final    Comment: (NOTE) Fact Sheet for  Patients: BloggerCourse.com  Fact Sheet for Healthcare Providers: SeriousBroker.it  This test is not yet approved or cleared by the [place]  FDA and has been authorized for detection and/or diagnosis of SARS-CoV-2 by FDA under an Emergency Use Authorization (EUA). This EUA will remain in effect (meaning this test can be used) for the duration of the COVID-19 declaration under Section 564(b)(1) of the Act, 21 U.S.C. section 360bbb-3(b)(1), unless the authorization is terminated or revoked.  Performed at [facility], [address], [place], [place] [address]   MRSA Next Gen by PCR, Nasal     Status: None   Collection Time: 01/19/24  7:24 PM   Specimen: Nasal Mucosa; Nasal Swab  Result Value Ref Range Status   MRSA by PCR Next Gen NOT DETECTED NOT DETECTED Final    Comment: (NOTE) The GeneXpert MRSA Assay (FDA approved for NASAL specimens only), is one component of a comprehensive MRSA colonization surveillance program. It is not intended to diagnose MRSA  infection nor to guide or monitor treatment for MRSA infections. Test performance is not FDA approved in patients less than 75 years old. Performed at [facility], [address] [place], [place] [address]   Body fluid culture w Gram Stain     Status: None (Preliminary result)   Collection Time: 01/20/24  2:59 PM   Specimen: Pleural Fluid  Result Value Ref Range Status   Specimen Description   Final    PLEURAL Performed at [facility], [address], [place], [place] [address]    Special Requests   Final    NONE Performed at [facility], [address], [place], [place] [address]    Gram Stain   Final    WBC PRESENT, PREDOMINANTLY MONONUCLEAR NO ORGANISMS SEEN CYTOSPIN SMEAR    Culture   Final    NO GROWTH 3 DAYS Performed at [facility], [address] [place], [place] [address]    Report  Status PENDING  Incomplete    Time spent: 50 minutes  Signed: Toribio Hummer, MD Feb 14, 2024

## 2024-01-24 NOTE — Consult Note (Signed)
 Consultation Note Date: 02/21/2024   Patient Name: Oscar Mills  DOB: [birth year]  MRN: [id number]  Age / Sex: 73 y.o., male   PCP: Donzella Lauraine SAILOR, DO Referring Physician: Sebastian Toribio GAILS, MD  Reason for Consultation: Establishing goals of care     Chief Complaint/History of Present Illness:   Patient is a 73 year old male with a past medical history of tobacco use, alcohol  use, COPD, hypertension, diabetes mellitus type 2, PAD, and right renal mass who presented to [facility] on 01/19/2024 for management of shortness of breath and hypoxia.  Patient was placed on BiPAP in ED and after discussions with PCCM provider, was sent to [facility] for admission.  Recent imaging had shown necrotic lymphadenopathy in the neck bilaterally consistent with metastatic disease; bulky subclavicular and mediastinal and right hilar adenopathy causing SVC narrowing with metastatic disease or lymphoma as the primary considerations.  Patient also noted to have moderate bilateral pleural effusions.  Oncology consulted for recommendations and patient underwent biopsy for further workup.  Palliative medicine team consulted to assist with complex medical decision making.  Extensive review of EMR including recent documentation from hospitalist, oncology, and radiology.  Has been determined that patient has diagnosis of metastatic small cell lung cancer.  Patient and wife had already talked to oncologist and radiation oncology and did not want to pursue cancer directed therapies. Review of recent CMP noting BUN 53, creatinine 1.83, and so GFR 39.  Recent CBC noting patient's WBCs elevated at 12.6. Discussed care with bedside RN, hospitalist, PCCM provider, oncologist, and radiation oncologist to coordinate care.  ------------------------------------------------------------------------------------------------------------- Advance Care Planning Conversation  Pertinent diagnosis: New diagnosis of metastatic small  cell lung cancer, SVC syndrome, acute respiratory failure with, AKI  The patient and/or family consented to a voluntary Advance Care Planning Conversation in person. Individuals present for the conversation: Discussed care at patient's bedside with patient, wife, and patient's sister-in-law.  Summary of the conversation:  Presented to bedside to meet with patient.  Patient laying in bed with noticeable increased work of breathing.  Patient appears incredibly fatigued and ill.  Patient did respond to this provider.  Noted needed to have discussions and patient would want his wife at bedside so was able to go find his wife and sister-in-law already present to bedside to have further discussions.  Introduced myself as a member of the palliative medicine team and my role in patient's medical journey.  Reviewed medical diagnoses.  I again reviewed pathways for medical care moving forward.  Patient does not want to pursue cancer directed therapies.  Discussed that based on current evaluation, would recommend transitioning to comfort focused care.  Discussed what comfort focused care would and would not entail.  Discussed discontinuing interventions that are no longer focused on comfort such as imaging, IV fluids, and BiPAP.  Discussed would provide medications for pain, shortness of breath and agitation.  Wife's priority is that patient not suffer at the end of life.  Discussed based on current evaluation time could be short on matter of hours to days.  Noted would continue to monitor after transition to comfort focused care to determine next steps on where patient could receive comfort focused care.  Discussed if patient is rapidly deteriorating, would anticipate hospital death.  Family cannot care for patient at home with hospice.  Discussed should patient be stable, would recommend consideration of transfer to inpatient hospice for end-of-life care.  Noted would continue the discussions moving forward based on how  patient does  today after transitioning to comfort focused care.  Discussed that patient does not have IV access due to his SVC syndrome.  Discussed starting subcutaneous  continuous infusion to assist with work of breathing.  Wife agreeing with this plan.  Outcome of the conversations and/or documents completed:  Transition to full comfort focused care at this time.  I spent 30 minutes providing separately identifiable ACP services with the patient and/or surrogate decision maker in a voluntary, in-person conversation discussing the patient's wishes and goals as detailed in the above note.  Tinnie Radar, DO Palliative Medicine Provider  -------------------------------------------------------------------------------------------------------------  Spent time providing emotional support via active listening.  All questions answered at that time.  Noted palliative medicine team will continue to follow along with patient's medical journey.  Discussed care with hospitalist, PCCM provider, and RN to coordinate transition to full comfort focused care at this time.  Continue discussions with care team throughout the day due to severe symptom burden and the need for adjustment of medications.  Primary Diagnoses  Present on Admission:  Acute respiratory failure with hypoxia (HCC)  Uncontrolled diabetes mellitus with hyperglycemia (HCC)  PAD (peripheral artery disease) (HCC)  Hypertension associated with diabetes (HCC)  Lymphadenopathy  Acute hypoxic respiratory failure (HCC)  Past Medical History:  Diagnosis Date   Arthritis    NECK   Diabetes mellitus, type II (HCC)    Dysfunctional alcohol  use    now quit   GERD (gastroesophageal reflux disease)    Hypertension    Neuropathy    feet   Pancreatitis    Peripheral vascular disease (HCC)    Stress fracture of ankle    Social History   Socioeconomic History   Marital status: Married    Spouse name: Not on file   Number of children: 2    Years of education: 12   Highest education level: 11th grade  Occupational History   Occupation: self-employed  Tobacco Use   Smoking status: Every Day    Current packs/day: 0.50    Average packs/day: 0.5 packs/day for 40.0 years (20.0 ttl pk-yrs)    Types: Cigarettes   Smokeless tobacco: Never  Vaping Use   Vaping status: Never Used  Substance and Sexual Activity   Alcohol  use: Not Currently    Comment: quit around 2012   Drug use: No   Sexual activity: Not on file  Other Topics Concern   Not on file  Social History Narrative   Not on file   Social Drivers of Health   Financial Resource Strain: Low Risk  (03/13/2023)   Overall Financial Resource Strain (CARDIA)    Difficulty of Paying Living Expenses: Not very hard  Food Insecurity: Patient Unable To Answer (01/19/2024)   Hunger Vital Sign    Worried About Running Out of Food in the Last Year: Patient unable to answer    Ran Out of Food in the Last Year: Patient unable to answer  Transportation Needs: Patient Unable To Answer (01/19/2024)   PRAPARE - Transportation    Lack of Transportation (Medical): Patient unable to answer    Lack of Transportation (Non-Medical): Patient unable to answer  Physical Activity: Insufficiently Active (08/20/2023)   Exercise Vital Sign    Days of Exercise per Week: 1 day    Minutes of Exercise per Session: 70 min  Stress: No Stress Concern Present (08/20/2023)   [employer] of Occupational Health - Occupational Stress Questionnaire    Feeling of Stress : Not at all  Social Connections: Patient  Unable To Answer (01/19/2024)   Social Connection and Isolation Panel    Frequency of Communication with Friends and Family: Patient unable to answer    Frequency of Social Gatherings with Friends and Family: Patient unable to answer    Attends Religious Services: Patient unable to answer    Active Member of Clubs or Organizations: Patient unable to answer    Attends Banker  Meetings: Patient unable to answer    Marital Status: Patient unable to answer   Family History  Problem Relation Age of Onset   Heart attack Father    Scheduled Meds:  gabapentin   600 mg Oral TID   nicotine   21 mg Transdermal Daily   sodium chloride  flush  3 mL Intravenous Q12H   sodium chloride  flush  3 mL Intravenous Q12H   Continuous Infusions:  HYDROmorphone  0.5 mg/hr (02-01-2024 1400)   PRN Meds:.acetaminophen  **OR** acetaminophen , antiseptic oral rinse, artificial tears, bisacodyl , glycopyrrolate  **OR** glycopyrrolate  **OR** glycopyrrolate , haloperidol  **OR** haloperidol  **OR** haloperidol  lactate, HYDROmorphone  (DILAUDID ) injection, LORazepam , morphine  CONCENTRATE, ondansetron  **OR** ondansetron  (ZOFRAN ) IV, mouth rinse, polyethylene glycol, sodium chloride  flush Allergies  Allergen Reactions   Iohexol  Hives    Hives with Omnipaque  300 observed by Dr Wadie, no meds given   CBC:    Component Value Date/Time   WBC 12.6 (H) 01-Feb-2024 0339   HGB 11.3 (L) February 01, 2024 0339   HGB 14.7 03/22/2023 1138   HGB 15.5 03/14/2023 1458   HCT 36.8 (L) 02/01/2024 0339   HCT 47.2 03/14/2023 1458   PLT 235 2024/02/01 0339   PLT 280 03/22/2023 1138   PLT 272 03/14/2023 1458   MCV 100.0 Feb 01, 2024 0339   MCV 96 03/14/2023 1458   NEUTROABS 10.4 (H) 01-Feb-2024 0339   NEUTROABS 10.8 (H) 03/14/2023 1458   LYMPHSABS 1.1 2024-02-01 0339   LYMPHSABS 2.2 03/14/2023 1458   MONOABS 1.0 Feb 01, 2024 0339   EOSABS 0.0 02/01/24 0339   EOSABS 0.0 03/14/2023 1458   BASOSABS 0.0 01-Feb-2024 0339   BASOSABS 0.0 03/14/2023 1458   Comprehensive Metabolic Panel:    Component Value Date/Time   NA 142 2024-02-01 0339   NA 131 (L) 03/14/2023 1458   K 4.4 February 01, 2024 0339   CL 110 02-01-2024 0339   CO2 23 01-Feb-2024 0339   BUN 53 (H) 2024-02-01 0339   BUN 18 03/14/2023 1458   CREATININE 1.83 (H) February 01, 2024 0339   CREATININE 0.76 01/18/2022 1121   GLUCOSE 211 (H) 02-01-24 0339   CALCIUM  8.5 (L)  Feb 01, 2024 0339   AST 42 (H) 01-Feb-2024 0339   ALT 42 February 01, 2024 0339   ALKPHOS 91 01-Feb-2024 0339   BILITOT 0.6 02-01-24 0339   BILITOT 0.3 03/14/2023 1458   PROT 5.6 (L) 02/01/2024 0339   PROT 6.5 03/14/2023 1458   ALBUMIN 2.5 (L) 01-Feb-2024 0339   ALBUMIN 4.1 03/14/2023 1458    Physical Exam: Vital Signs: BP (!) 133/51   Pulse 71   Temp (!) 95.9 F (35.5 C) (Axillary)   Resp 17   Ht 6' 2 (1.88 m)   Wt 61.2 kg   SpO2 (!) 89%   BMI 17.32 kg/m  SpO2: SpO2: (!) 89 % O2 Device: O2 Device: High Flow Nasal Cannula O2 Flow Rate: O2 Flow Rate (L/min): 15 L/min Intake/output summary:  Intake/Output Summary (Last 24 hours) at 2024-02-01 1457 Last data filed at 02/01/2024 0830 Gross per 24 hour  Intake 781.91 ml  Output 1050 ml  Net -268.09 ml   LBM: Last BM Date : 01/20/24  Baseline Weight: Weight: 61.2 kg Most recent weight: Weight: 61.2 kg  General: Appears ill, fatigued, hypophonia noted Cardiovascular: RRR, edema in LE b/l Respiratory: increased work of breathing noted, use of accessory muscles to breathe present Abdomen: distended Neuro: Awake, tired          Palliative Performance Scale: 10%              Additional Data Reviewed: Recent Labs    01/22/24 0324 January 29, 2024 0339  WBC 14.1* 12.6*  HGB 11.1* 11.3*  PLT 243 235  NA 140 142  BUN 54* 53*  CREATININE 2.02* 1.83*    Imaging: DG CHEST PORT 1 VIEW CLINICAL DATA:  Shortness of breath.  EXAM: PORTABLE CHEST 1 VIEW  COMPARISON:  Chest radiograph dated 01/21/2024.  FINDINGS: Shallow inspiration. Slight interval increase in the size of the left pleural effusion and associated left lung base atelectasis or pneumonia. Right perihilar density or mass again noted. No pneumothorax. Stable cardiac silhouette no acute osseous pathology.  IMPRESSION: Slight interval increase in the size of the left pleural effusion and associated left lung base atelectasis or pneumonia.  Electronically Signed   By:  Vanetta Chou M.D.   On: 2024-01-29 12:48    I personally reviewed recent imaging.   Palliative Care Assessment and Plan Summary of Established Goals of Care and Medical Treatment Preferences   Patient is a 73 year old male with a past medical history of tobacco use, alcohol  use, COPD, hypertension, diabetes mellitus type 2, PAD, and right renal mass who presented to [facility] on 01/19/2024 for management of shortness of breath and hypoxia.  Patient was placed on BiPAP in ED and after discussions with PCCM provider, was sent to [facility] for admission.  Recent imaging had shown necrotic lymphadenopathy in the neck bilaterally consistent with metastatic disease; bulky subclavicular and mediastinal and right hilar adenopathy causing SVC narrowing with metastatic disease or lymphoma as the primary considerations.  Patient also noted to have moderate bilateral pleural effusions.  Oncology consulted for recommendations and patient underwent biopsy for further workup.  Palliative medicine team consulted to assist with complex medical decision making.  # Complex medical decision making/goals of care  # Complex medical decision making/goals of care  - Discussed care with at patient's bedside with wife and sister in law.  Discussed pathways for medical care moving forward.  Patient has elected that he does not want to proceed with any cancer directed therapies including chemotherapy or radiation.  Patient currently has SVC syndrome in setting of his metastatic cancer.  Discussed transition to focusing on comfort at end-of-life at this time.  Patient and family agreeing with this plan.  Discussed based on patient's transition to comfort focused care, if stabilizes may need to consider inpatient hospice referral for evaluation.  Wife cannot take patient home to care for him with hospice.  Will initiate comfort focused measures at this time and monitor patient in hospital as also may have in-hospital death if  continues to quickly deteriorate.  Palliative medicine team continuing to follow along with patient's medical journey.  -At this time we will discontinue interventions that are no longer focused on comfort such as IV fluids, imaging, or lab work.  Will instead focus on symptom management of pain, dyspnea, and agitation in the setting of end-of-life care.    Code Status: Do not attempt resuscitation (DNR) - Comfort care  # Symptom management Patient is receiving these palliative interventions for symptom management with an intent to improve quality  of life.     -Pain/Dyspnea, acute in the setting of end-of-life care Patient does not have IV access and appears to be in severe respiratory distress.  Focusing on patient's comfort at end-of-life.                               -Start subcutaneous Dilaudid  continuous titratable infusion beginning at 1 mg/h    - Start subcutaneous Dilaudid  2 mg subcutaneously every 30 minutes as needed.  Have already increased this from 1 mg dose.  Continue to titrate based on patient's symptom burden.                 -Anxiety/agitation, in the setting of end-of-life care Informed by RN developed severe agitation throughout the day.                               -Start oral Ativan  solution 1 mg every 4 hours as needed. Continue to adjust based on patient's symptom burden.                                 -Start Haldol  as needed   - Start as needed diazepam  2.5 mg every 4 hour rectally    - Trying to work with pharmacy to determine if subcutaneous/nasal Versed  can be given.  Followed up with pharmacy and informed her that system does not have order for subcutaneous Versed  to be given so unable to provide this.  Intranasal Versed  can only be ordered by neurology.                 -Secretions, in the setting of end-of-life care                               -Start glycopyrrolate  as needed.  # Psycho-social/Spiritual Support:  - Support System: Wife, sister-in-law - Desire  for further Chaplain support:yes  # Discharge Planning:  Anticipated Hospital Death - If stabilizes consider referral to inpatient hospice.  Thank you for allowing the palliative care team to participate in the care Ivonne FORBES Pouch.  Tinnie Radar, DO Palliative Care Provider PMT # [phone]  If patient remains symptomatic despite maximum doses, please call PMT at [phone] between 0700 and 1900. Outside of these hours, please call attending, as PMT does not have night coverage.

## 2024-01-24 DEATH — deceased

## 2024-02-07 ENCOUNTER — Ambulatory Visit: Admitting: Family Medicine

## 2024-08-25 ENCOUNTER — Ambulatory Visit: Payer: Medicare HMO
